# Patient Record
Sex: Male | Born: 1960 | Race: Black or African American | Hispanic: No | Marital: Single | Smoking: Former smoker
Health system: Southern US, Community
[De-identification: ages and names within clinical notes are randomized; demographics above are authoritative.]

## PROBLEM LIST (undated history)

## (undated) DIAGNOSIS — R1310 Dysphagia, unspecified: Secondary | ICD-10-CM

## (undated) DIAGNOSIS — I8500 Esophageal varices without bleeding: Secondary | ICD-10-CM

## (undated) DIAGNOSIS — G936 Cerebral edema: Secondary | ICD-10-CM

## (undated) DIAGNOSIS — I10 Essential (primary) hypertension: Secondary | ICD-10-CM

## (undated) DIAGNOSIS — E46 Unspecified protein-calorie malnutrition: Secondary | ICD-10-CM

## (undated) DIAGNOSIS — E876 Hypokalemia: Secondary | ICD-10-CM

## (undated) DIAGNOSIS — R739 Hyperglycemia, unspecified: Secondary | ICD-10-CM

---

## 1998-06-03 ENCOUNTER — Emergency Department (HOSPITAL_COMMUNITY): Admission: EM | Admit: 1998-06-03 | Discharge: 1998-06-03 | Payer: Self-pay | Admitting: Emergency Medicine

## 1999-06-04 ENCOUNTER — Emergency Department (HOSPITAL_COMMUNITY): Admission: EM | Admit: 1999-06-04 | Discharge: 1999-06-04 | Payer: Self-pay | Admitting: Emergency Medicine

## 1999-06-04 ENCOUNTER — Encounter: Payer: Self-pay | Admitting: Emergency Medicine

## 1999-10-19 ENCOUNTER — Emergency Department (HOSPITAL_COMMUNITY): Admission: EM | Admit: 1999-10-19 | Discharge: 1999-10-19 | Payer: Self-pay | Admitting: Emergency Medicine

## 2000-03-29 ENCOUNTER — Emergency Department (HOSPITAL_COMMUNITY): Admission: EM | Admit: 2000-03-29 | Discharge: 2000-03-29 | Payer: Self-pay

## 2000-11-19 ENCOUNTER — Emergency Department (HOSPITAL_COMMUNITY): Admission: EM | Admit: 2000-11-19 | Discharge: 2000-11-19 | Payer: Self-pay | Admitting: Emergency Medicine

## 2003-12-16 ENCOUNTER — Encounter: Admission: RE | Admit: 2003-12-16 | Discharge: 2003-12-16 | Payer: Self-pay | Admitting: Internal Medicine

## 2004-06-29 ENCOUNTER — Encounter: Admission: RE | Admit: 2004-06-29 | Discharge: 2004-06-29 | Payer: Self-pay | Admitting: *Deleted

## 2005-05-20 ENCOUNTER — Emergency Department (HOSPITAL_COMMUNITY): Admission: EM | Admit: 2005-05-20 | Discharge: 2005-05-20 | Payer: Self-pay | Admitting: Emergency Medicine

## 2006-01-03 ENCOUNTER — Encounter: Admission: RE | Admit: 2006-01-03 | Discharge: 2006-01-03 | Payer: Self-pay | Admitting: Internal Medicine

## 2008-02-05 ENCOUNTER — Ambulatory Visit (HOSPITAL_COMMUNITY): Admission: RE | Admit: 2008-02-05 | Discharge: 2008-02-05 | Payer: Self-pay | Admitting: *Deleted

## 2008-02-05 ENCOUNTER — Encounter (INDEPENDENT_AMBULATORY_CARE_PROVIDER_SITE_OTHER): Payer: Self-pay | Admitting: *Deleted

## 2008-03-20 ENCOUNTER — Encounter: Admission: RE | Admit: 2008-03-20 | Discharge: 2008-03-20 | Payer: Self-pay | Admitting: Internal Medicine

## 2008-12-05 ENCOUNTER — Ambulatory Visit: Payer: Self-pay | Admitting: Emergency Medicine

## 2008-12-05 ENCOUNTER — Inpatient Hospital Stay (HOSPITAL_COMMUNITY): Admission: EM | Admit: 2008-12-05 | Discharge: 2008-12-08 | Payer: Self-pay | Admitting: Emergency Medicine

## 2008-12-07 ENCOUNTER — Ambulatory Visit: Payer: Self-pay | Admitting: Gastroenterology

## 2009-01-06 ENCOUNTER — Ambulatory Visit (HOSPITAL_COMMUNITY): Admission: RE | Admit: 2009-01-06 | Discharge: 2009-01-06 | Payer: Self-pay | Admitting: *Deleted

## 2010-04-08 ENCOUNTER — Encounter: Admission: RE | Admit: 2010-04-08 | Discharge: 2010-04-08 | Payer: Self-pay | Admitting: Internal Medicine

## 2010-08-03 ENCOUNTER — Encounter: Payer: Self-pay | Admitting: *Deleted

## 2010-10-20 LAB — COMPREHENSIVE METABOLIC PANEL
ALT: 38 U/L (ref 0–53)
ALT: 40 U/L (ref 0–53)
ALT: 51 U/L (ref 0–53)
ALT: 65 U/L — ABNORMAL HIGH (ref 0–53)
AST: 130 U/L — ABNORMAL HIGH (ref 0–37)
AST: 147 U/L — ABNORMAL HIGH (ref 0–37)
Albumin: 2.3 g/dL — ABNORMAL LOW (ref 3.5–5.2)
Alkaline Phosphatase: 118 U/L — ABNORMAL HIGH (ref 39–117)
CO2: 22 mEq/L (ref 19–32)
Calcium: 6.6 mg/dL — ABNORMAL LOW (ref 8.4–10.5)
Calcium: 6.8 mg/dL — ABNORMAL LOW (ref 8.4–10.5)
Calcium: 7.4 mg/dL — ABNORMAL LOW (ref 8.4–10.5)
Chloride: 110 mEq/L (ref 96–112)
Creatinine, Ser: 0.92 mg/dL (ref 0.4–1.5)
Creatinine, Ser: 0.93 mg/dL (ref 0.4–1.5)
Creatinine, Ser: 1.17 mg/dL (ref 0.4–1.5)
GFR calc Af Amer: >60 mL/min (ref >60)
GFR calc Af Amer: >60 mL/min (ref >60)
GFR calc non Af Amer: >60 mL/min (ref >60)
Glucose, Bld: 128 mg/dL — ABNORMAL HIGH (ref 70–99)
Glucose, Bld: 155 mg/dL — ABNORMAL HIGH (ref 70–99)
Potassium: 5.2 mEq/L — ABNORMAL HIGH (ref 3.5–5.1)
Sodium: 132 mEq/L — ABNORMAL LOW (ref 135–145)
Sodium: 132 mEq/L — ABNORMAL LOW (ref 135–145)
Sodium: 133 mEq/L — ABNORMAL LOW (ref 135–145)
Sodium: 134 mEq/L — ABNORMAL LOW (ref 135–145)
Total Bilirubin: 2.6 mg/dL — ABNORMAL HIGH (ref 0.3–1.2)
Total Protein: 4.8 g/dL — ABNORMAL LOW (ref 6.0–8.3)
Total Protein: 5.1 g/dL — ABNORMAL LOW (ref 6.0–8.3)
Total Protein: 6.4 g/dL (ref 6.0–8.3)

## 2010-10-20 LAB — PROTIME-INR
INR: 1.3 (ref 0.00–1.49)
INR: 1.4 (ref 0.00–1.49)
Prothrombin Time: 17 seconds — ABNORMAL HIGH (ref 11.6–15.2)
Prothrombin Time: 18.2 seconds — ABNORMAL HIGH (ref 11.6–15.2)

## 2010-10-20 LAB — CROSSMATCH: Antibody Screen: NEGATIVE

## 2010-10-20 LAB — HEMOGLOBIN AND HEMATOCRIT, BLOOD
HCT: 30 % — ABNORMAL LOW (ref 39.0–52.0)
HCT: 30.2 % — ABNORMAL LOW (ref 39.0–52.0)
Hemoglobin: 10.4 g/dL — ABNORMAL LOW (ref 13.0–17.0)
Hemoglobin: 10.4 g/dL — ABNORMAL LOW (ref 13.0–17.0)
Hemoglobin: 10.6 g/dL — ABNORMAL LOW (ref 13.0–17.0)

## 2010-10-20 LAB — DIFFERENTIAL
Basophils Relative: 0 % (ref 0–1)
Eosinophils Absolute: 0 10*3/uL (ref 0.0–0.7)
Lymphs Abs: 2.4 10*3/uL (ref 0.7–4.0)
Monocytes Absolute: 0.7 10*3/uL (ref 0.1–1.0)
Monocytes Relative: 7 % (ref 3–12)
Neutro Abs: 6.8 10*3/uL (ref 1.7–7.7)

## 2010-10-20 LAB — PREPARE RBC (CROSSMATCH)

## 2010-10-20 LAB — CBC
Hemoglobin: 10.4 g/dL — ABNORMAL LOW (ref 13.0–17.0)
Hemoglobin: 7.8 g/dL — CL (ref 13.0–17.0)
MCHC: 35.4 g/dL (ref 30.0–36.0)
MCHC: 35.4 g/dL (ref 30.0–36.0)
MCHC: 35.5 g/dL (ref 30.0–36.0)
MCV: 92 fL (ref 78.0–100.0)
MCV: 93 fL (ref 78.0–100.0)
MCV: 93.8 fL (ref 78.0–100.0)
Platelets: 69 10*3/uL — ABNORMAL LOW (ref 150–400)
Platelets: 69 10*3/uL — ABNORMAL LOW (ref 150–400)
RBC: 3.12 MIL/uL — ABNORMAL LOW (ref 4.22–5.81)
RBC: 3.51 MIL/uL — ABNORMAL LOW (ref 4.22–5.81)
RDW: 16.1 % — ABNORMAL HIGH (ref 11.5–15.5)
RDW: 19 % — ABNORMAL HIGH (ref 11.5–15.5)
RDW: 19.3 % — ABNORMAL HIGH (ref 11.5–15.5)
WBC: 5.9 10*3/uL (ref 4.0–10.5)
WBC: 9 10*3/uL (ref 4.0–10.5)

## 2010-10-20 LAB — APTT: aPTT: 29 seconds (ref 24–37)

## 2010-10-20 LAB — HEMOCCULT GUIAC POC 1CARD (OFFICE): Fecal Occult Bld: POSITIVE

## 2010-10-20 LAB — ABO/RH: ABO/RH(D): O POS

## 2010-11-24 NOTE — Op Note (Signed)
NAMEKIMARION, CHERY NO.:  [id number]   MEDICAL RECORD NO.:  [id number]          PATIENT TYPE:  AMB   LOCATION:  ENDO                         FACILITY:  [facility]   PHYSICIAN:  Georgiana Spinner, M.D.    DATE OF BIRTH:  [birth year]   DATE OF PROCEDURE:  02/05/2008  DATE OF DISCHARGE:                               OPERATIVE REPORT   PROCEDURE:  Upper endoscopy with biopsy.   INDICATIONS:  GERD, with cirrhosis, rule out varices.   ANESTHESIA:  1. Fentanyl 100 mcg.  2. Versed 10 mg.  3. Benadryl 25 mg.   PROCEDURE:  With the patient mildly sedated in the left lateral  decubitus position, the Pentax videoscopic endoscope was inserted in the  mouth, passed under direct vision through the esophagus, which appeared  normal until we reached the distal esophagus, and there was one band of  esophageal varices, with dilation extending proximally.  There was a  second band that just terminated very abruptly in the distal esophagus.  We entered into the stomach.  Fundus, body, antrum, duodenal bulb, and  second portion of the duodenum were visualized.  From this point, the  endoscope was slowly withdrawn, taking circumferential views of the  duodenal mucosa until the endoscope had been pulled back into stomach  and placed in retroflexion to view the stomach from below.  The  endoscope was straightened and withdrawn, taking circumferential views  of the remaining gastric and esophageal mucosa, stopping to photograph  and biopsy the fundus, where there appeared to be changes of passive  gastropathy, and the distal esophagus, where there appeared to be  possibly one area of Barrett's.  The endoscope was withdrawn, taking  circumferential views of the remaining esophageal mucosa.  The patient's  vital signs and pulse oximeter remained stable.  The patient tolerated  the procedure well, without apparent complications.   FINDINGS:  Esophageal varices.  No evidence of gastric varices.   Passive  gastropathy seen in the fundus, and question of one lick of Barrett's  esophagus.   PLAN:  Await biopsy reports.  The patient will call me for results and  follow up with me as an outpatient.  Will discuss alcohol abstinence and  need for this in this patient.  Proceed to colonoscopy as planned.           ______________________________  Georgiana Spinner, M.D.     GMO/MEDQ  D:  02/05/2008  T:  02/05/2008  Job:  [id number]

## 2010-11-24 NOTE — H&P (Signed)
NAMETHORNE, WIRZ NO.:  [id number]   MEDICAL RECORD NO.:  [id number]          PATIENT TYPE:  INP   LOCATION:  2106                         FACILITY:  [facility]   PHYSICIAN:  Zannie Cove, MD     DATE OF BIRTH:  January 18, 1961   DATE OF ADMISSION:  12/05/2008  DATE OF DISCHARGE:                              HISTORY & PHYSICAL   PRIMARY CARE PHYSICIAN:  Dr. Juline Patch.   GI PHYSICIAN:  Georgiana Spinner, M.D.   CHIEF COMPLAINT:  Vomiting blood.   HISTORY OF PRESENTING ILLNESS:  Mr. Morado is a 50 year old gentleman  with prior history of esophageal varices presents after 4 episodes of  hematemesis which were large volume bright red blood and 3 episodes of  melena since this afternoon.  Denies any abdominal pain.  He denies any  NSAID use.  He used to drink hard liquor excessively in the past, he has  cut down to currently about 4 beers per day.  His last drink was  yesterday.  Denies any dizziness, syncope, chest pain, or shortness of  breath.   PAST MEDICAL HISTORY:  Esophageal varices, diagnosed about 6 years ago,  and alcoholic liver disease.   PAST SURGICAL HISTORY:  None.   MEDICATIONS:  1. Zetia 10 mg once a day.  2. Xanax 0.5 mg twice a day.  3. Toprol-XL 25 mg once a day.  4. Spironolactone 25 mg once a day.  5. Alavert 1 tab p.o. p.r.n.   ALLERGIES:  No known drug allergies.   SOCIAL HISTORY:  Lives at home.  Works as a Merchandiser, retail.  He used to  drink hard liquor, heavy drinker for about 30 years, cut down to 4 beers  per day.  Smokes 1 pack per day for 30 years.   FAMILY HISTORY:  No liver disease.  No GI cancers.   REVIEW OF SYSTEMS:  Twelve systems reviewed negative except per HPI.   PHYSICAL EXAMINATION:  VITAL SIGNS:  Temp is 102, pulse is 110, blood  pressure 191/60, respirations 18, sating 98% on room air.  GENERAL:  Alert, awake, oriented x3.  No distress.  HEENT:  With mild icterus and pallor.  No JVD.  Moist mucous membranes.  CARDIOVASCULAR:  S1, S2, regular rate rhythm.  No murmurs, rubs, or  gallops.  LUNGS:  Clear to auscultation bilaterally.  ABDOMEN:  Mildly distended, nontender, positive bowel sounds.  EXTREMITIES:  No edema, clubbing, cyanosis.  RECTAL:  Heme positive black stools.   LABORATORY DATA:  Hemoglobin of 7.7, white count of 9.9, platelets 69.  Sodium 132, potassium 5.2, chloride 102, bicarb 22, BUN 14, creatinine  1.45, glucose 133, PT is 17.0, INR 1.3, PTT is 29, total bilirubin is  2.0, AST 92, ALT 38, alkaline phosphatase 118.   ASSESSMENT AND PLAN:  1. A 50 year old gentleman with upper gastrointestinal bleed, given      prior history of varices suspect, variceal bleed admitted to the      ICU.  He is too large for intravenous and normal saline at 125 mL      an hour.  We will type and cross packed red blood cells, and check      H and H q.4 h. for now transfuse to keep hemoglobin greater than 9.      Octreotide 50 mcg bolus followed by 25 mcg per hour infusion.      Gastrointestinal has been consulted and will see the patient      tonight.  The patient also started Protonix 40 mg intravenous      daily.  2. Alcohol liver disease with varices.  We will give the patient      thiamine and folate for now.  Start the patient on CIWA scale to      watch for signs and symptoms of withdrawal.  Hold Toprol and      Aldactone for now.  3. Thrombocytopenia.  Most likely splenic sequestration secondary to      his alcoholic liver disease.  4. Deep venous thrombosis prophylaxis with PAS stockings.  5. The patient is full code.      Zannie Cove, MD  Electronically Signed     PJ/MEDQ  D:  12/05/2008  T:  12/06/2008  Job:  [id number]

## 2010-11-24 NOTE — Discharge Summary (Signed)
NAMEGWIN, EAGON NO.:  [id number]   MEDICAL RECORD NO.:  [id number]          PATIENT TYPE:  INP   LOCATION:  5159                         FACILITY:  [facility]   PHYSICIAN:  Michelene Gardener, MD    DATE OF BIRTH:  08/10/1960   DATE OF ADMISSION:  12/05/2008  DATE OF DISCHARGE:  12/08/2008                               DISCHARGE SUMMARY   DISCHARGE DIAGNOSES:  1. Acute post hemorrhagic anemia that required blood transfusion.  2. Upper gastrointestinal bleed secondary to esophageal varices.  3. Hypotension secondary to hemorrhage.  4. Hypertension, Toprol and spironolactone have been held during this      hospitalization.  5. Alcoholic cirrhosis and the patient is an active drinker.  6. Acute renal failure secondary to prerenal azotemia that has      resolved.  7. Hyponatremia that improved with his IV normal saline.  8. Thrombocytopenia secondary to cirrhosis.   DISCHARGE MEDICATIONS:  1. Zetia 10 mg p.o. once a day.  2. Alavert 1 tablet p.o. as needed.  3. Xanax 0.5 mg twice daily.  4. Protonix 40 mg once a day.   MEDICATIONS STOPPED:  Include Toprol and spironolactone until  reevaluated by primary physician.   CONSULTATIONS:  GI consult.   PROCEDURE:  EGD that showed large varices in the esophagus that were  banded.   Follow up with primary doctor within 1-2 weeks and with Dr. Virginia Rochester within 1-  2 weeks.   DIAGNOSTIC STUDIES:  Chest x-ray on Dec 06, 2008, showed mild  congestion.   COURSE OF HOSPITALIZATION:  1. Acute posthemorrhagic anemia.  This patient was admitted to the      hospital.  The patient was transfused.  Hemoglobin at the time of      admission was 7.7.  Post transfusion hemoglobin remains stable.  GI      consultation was done.  The patient was taken to EGD that showed      esophageal varices that were banded.  The patient remained stable      during this hospitalization with no recurrent bleed.  He received      octreotide drip in  addition to IV Protonix.  He was monitored      initially in the intensive care unit and then when stable, he was      monitored in the floor.  2. Upper GI bleed secondary to esophageal varices, management was as      #1.  3. Hypotension that was secondary to volume loss from his bleeding and      his Toprol and his spironolactone were held during this      hospitalization.  Blood pressure at the time of discharge was      116/67.  I will recommend to continue hold his Toprol and      spironolactone until reevaluated by his primary doctor.  4. Alcoholic cirrhosis.  The patient is an active drinker.  I had a      prolonged discussion with him regarding his drinking and he stated      that he is quitting and  he will never go back to drinking.  5. Acute renal failure and that was secondary to dehydration and that      was corrected by IV fluids.  BUN/creatinine at the time of      discharge is 8/0.92.  6. Hyponatremia.  Again that was improved with IV fluids and sodium at      the time of discharge was 134.  7. Thrombocytopenia secondary to his underlying cirrhosis.  Recommend      to monitor this on a regular basis by his primary doctor.   The patient was doing very well at the time of discharge.  He is back to  his baseline.  There is no abdominal pain.  There is no nausea, there is  no vomiting.  There is no more bleeding.  He tolerated diet well and  will be discharged home today and will follow with his primary doctor  and Dr. Virginia Rochester within a week.   Total assessment time is 1 hour.      Michelene Gardener, MD  Electronically Signed     NAE/MEDQ  D:  12/08/2008  T:  12/09/2008  Job:  [id number]

## 2010-11-24 NOTE — Op Note (Signed)
Joshua Price, Joshua Price NO.:  [id number]   MEDICAL RECORD NO.:  [id number]          PATIENT TYPE:  AMB   LOCATION:  ENDO                         [facility]ACILITY:  [facility]   PHYSICIAN:  Georgiana Spinner, M.D.    DATE OF BIRTH:  07/05/1961   DATE OF PROCEDURE:  02/05/2008  DATE OF DISCHARGE:                               OPERATIVE REPORT   PROCEDURE:  Colonoscopy.   INDICATIONS:  Colon cancer screening.   ANESTHESIA:  1. Fentanyl 100 mcg.  2. Versed 5 mg.   PROCEDURE:  With the patient mildly sedated in the left lateral  decubitus position, the Pentax videoscopic colonoscope was inserted into  the rectum and passed under direct vision.  With pressure applied, the  patient rolled to his back, to his right side, and subsequently back to  his back and left side, we were able to reach the cecum, identified by  ileocecal valve and appendiceal orifice, both of which were  photographed.  From this point, the colonoscope was slowly withdrawn,  taking circumferential views of the colonic mucosa, stopping in the  rectum which appeared normal on direct and showed hemorrhoids on  retroflexed view, and pulled through the anal canal and showed  hemorrhoids as well.  I inspected the perineum, and there were some  external skin tags.  The patient tolerated the procedure well, without  apparent complications.   FINDINGS:  Internal and external hemorrhoids, otherwise unremarkable  examination, limited somewhat by prep which was suboptimal, but no gross  lesions seen.           ______________________________  Georgiana Spinner, M.D.     GMO/MEDQ  D:  02/05/2008  T:  02/05/2008  Job:  [id number]

## 2010-11-24 NOTE — Op Note (Signed)
Joshua Price, GANUS NO.:  [id number]   MEDICAL RECORD NO.:  [id number]          PATIENT TYPE:  INP   LOCATION:  2106                         FACILITY:  [facility]   PHYSICIAN:  Georgiana Spinner, M.D.    DATE OF BIRTH:  11/13/1960   DATE OF PROCEDURE:  DATE OF DISCHARGE:                               OPERATIVE REPORT   PROCEDURE:  Upper endoscopy with banding of varices and injection of  stomach.   INDICATIONS:  Acute gastrointestinal bleeding manifested by passage of  blood per rectum and hematemesis with hypotension partially corrected  with fluids and blood.   PROCEDURE:  With the patient in the left lateral decubitus position  under general anesthesia, the Pentax videoscopic endoscope was inserted  in the mouth and passed under direct vision through the esophagus which  showed two columns of fairly large varices distally.  On both columns,  there appeared to be a mild red wheel but no active bleeding was seen  from the varices on initial inspection.  We advanced the endoscope into  the stomach and there was in the fundus a one small area of bright red  blood that we washed but no active bleeding was seen at this site.  No  ulcers were noted and it was photographed.  We advanced to the remainder  of the stomach and duodenal bulb, second portion of duodenum were  remarkably clear of blood considering the history of hematemesis that  was in fact witnessed in the emergency room.  From this point the  endoscope was slowly withdrawn taking circumferential views of the  duodenal mucosa until the endoscope been pulled back into the stomach  placed in retroflexion to view the stomach from below.  The endoscope  was then straightened and pulled back to the fundus where the bright red  blood was seen and I elected doing after clearing this area to inject 2  mL of epinephrine directly into this site.  Blanching was seen but no  bleeding was noted actively.  The endoscope was  then pulled back to the  esophagus and withdrawn.  Subsequently, a bander was introduced to the  endoscope which I had trouble advancing when reinserting so I took the  bander off and instead reinserted the endoscope, passed to the stomach,  passed a guide wire, pulled out the endoscope and then re-attached the  bander over the endoscope and then inserted the endoscope and follow the  guidewire back into the esophagus.  In the distal esophagus, both  columns that had previously been noted were visualized and bands were  placed to one larger column one on the other.  I then withdrew the  endoscope, removed the bander and reinserted the endoscope to see that  the bands were still intact and went distally into the stomach, no  active bleeding was again seen.  The endoscope was withdrawn.  The  patient's vital signs and pulse oximeter remained stable.  The patient  tolerated the procedure well without apparent complications.   FINDINGS:  Large varices in the esophagus banded two columns with three  bands and injection to the area where there was bright red blood with no  active lesions 2 mL.   IMPRESSION:  Cause hematemesis certainly not overtly clear from this  procedure but I elected to band varices and inject an area where blood  was seen.  At this point, we will follow vital signs and hemoglobin  response to transfusion and decide on further therapy depending on the  patient's thrombin results.           ______________________________  Georgiana Spinner, M.D.    GMO/MEDQ  D:  12/06/2008  T:  12/06/2008  Job:  [id number]

## 2010-11-24 NOTE — Op Note (Signed)
Joshua Price, Joshua Price NO.:  [id number]   MEDICAL RECORD NO.:  [id number]          PATIENT TYPE:  AMB   LOCATION:  ENDO                         FACILITY:  [facility]   PHYSICIAN:  Georgiana Spinner, M.D.    DATE OF BIRTH:  Feb 07, 1961   DATE OF PROCEDURE:  DATE OF DISCHARGE:                               OPERATIVE REPORT   PROCEDURE:  Upper endoscopy with banding of varices.   ANESTHESIA:  1. Fentanyl 100 mcg.  2. Versed 8 mg.  3. Benadryl 25 mg.   PROCEDURE:  With the patient mildly sedated in the left lateral  decubitus position, the Pentax videoscopic endoscope was inserted in the  mouth and passed under direct vision through the esophagus which  appeared normal until we reached the distal esophagus and we found two  columns of varices somewhat larger than the other and a photograph of  this area was taken.  We advanced into the stomach and the fundus, body,  antrum, duodenal bulb and the second portion of the duodenum were  visualized.  From this point, the endoscope was slowly withdrawn taking  circumferential views of the duodenal mucosa until the endoscope had  been pulled back into the stomach placed in retroflexion to view the  stomach from below.  The endoscope was straightened and withdrawn taking  circumferential views of the remaining gastric and esophageal mucosa  stopping to take a photograph.  Blood flecks were seen in the stomach.  After the endoscope had been withdrawn, a band ligator was attached to  the scope and the scope was reinserted into the esophagus and advanced  to the distal esophagus where the previously noted variceal columns were  seen and were banded.  I placed two bands on,  the M1 on the larger and  one on the smaller column quite distally and once this was completed to  my satisfaction.  The endoscope was withdrawn.  The patient's vital  signs and pulse oximeter remained stable.  The patient tolerated the  procedure well without  apparent complications.   FINDINGS:  Esophageal varices banded with clinical response.  The  patient will follow-up with me as an outpatient.           ______________________________  Georgiana Spinner, M.D.     GMO/MEDQ  D:  01/06/2009  T:  01/06/2009  Job:  [id number]

## 2013-05-12 ENCOUNTER — Encounter (HOSPITAL_COMMUNITY): Payer: Self-pay | Admitting: Emergency Medicine

## 2013-05-12 ENCOUNTER — Emergency Department (HOSPITAL_COMMUNITY)
Admission: EM | Admit: 2013-05-12 | Discharge: 2013-05-12 | Disposition: A | Discharge status: Home / Self Care | Payer: Self-pay | Attending: Emergency Medicine | Admitting: Emergency Medicine

## 2013-05-12 DIAGNOSIS — T7840XA Allergy, unspecified, initial encounter: Secondary | ICD-10-CM

## 2013-05-12 DIAGNOSIS — L509 Urticaria, unspecified: Secondary | ICD-10-CM | POA: Insufficient documentation

## 2013-05-12 DIAGNOSIS — F17200 Nicotine dependence, unspecified, uncomplicated: Secondary | ICD-10-CM | POA: Insufficient documentation

## 2013-05-12 DIAGNOSIS — Z8679 Personal history of other diseases of the circulatory system: Secondary | ICD-10-CM | POA: Insufficient documentation

## 2013-05-12 HISTORY — DX: Esophageal varices without bleeding: I85.00

## 2013-05-12 MED ORDER — DIPHENHYDRAMINE HCL 50 MG/ML IJ SOLN
25.0000 mg | Freq: Once | INTRAMUSCULAR | Status: AC
  Administered 2013-05-12: 25 mg via INTRAVENOUS
  Filled 2013-05-12: qty 1

## 2013-05-12 MED ORDER — EPINEPHRINE HCL 0.1 MG/ML IJ SOSY
0.1000 mg | PREFILLED_SYRINGE | Freq: Once | INTRAMUSCULAR | Status: AC
  Administered 2013-05-12: 0.1 mg via SUBCUTANEOUS
  Filled 2013-05-12: qty 10

## 2013-05-12 MED ORDER — DIPHENHYDRAMINE HCL 25 MG PO CAPS: 25.0000 mg | ORAL_CAPSULE | Freq: Four times a day (QID) | ORAL | Status: DC | PRN

## 2013-05-12 MED ORDER — PREDNISONE 10 MG PO TABS: 20.0000 mg | ORAL_TABLET | Freq: Every day | ORAL | Status: DC

## 2013-05-12 MED ORDER — METHYLPREDNISOLONE SODIUM SUCC 125 MG IJ SOLR
125.0000 mg | Freq: Once | INTRAMUSCULAR | Status: AC
  Administered 2013-05-12: 125 mg via INTRAVENOUS
  Filled 2013-05-12: qty 2

## 2013-05-12 MED ORDER — FAMOTIDINE IN NACL 20-0.9 MG/50ML-% IV SOLN
20.0000 mg | Freq: Once | INTRAVENOUS | Status: AC
  Administered 2013-05-12: 20 mg via INTRAVENOUS
  Filled 2013-05-12: qty 50

## 2013-05-12 NOTE — ED Notes (Signed)
Pt reports itching and hives all over approx 45 mins ago, unsure what he is allergic to. Having swelling to face and lip, airway intact at this time. Pt has not taken any benadryl pta.

## 2013-05-12 NOTE — ED Provider Notes (Signed)
CSN: [id number]     Arrival date & time 05/12/13  1220 History   First MD Initiated Contact with Patient 05/12/13 1237     Chief Complaint  Patient presents with  . Allergic Reaction    HPI  Patient presents with rash and swollen lips. He exhibits this morning at his mother's house. Some easy many times before. Chronic over the first time he been in his closet for over a year. Her itching of his arms or joint swellingand itching and noticed a rash on his arms and legs he presents here. No tongue swelling. No posterior pharyngeal swelling. No dyspnea. No wheezing.  Past Medical History  Diagnosis Date  . Esophageal varices    History reviewed. No pertinent past surgical history. History reviewed. No pertinent family history. History  Substance Use Topics  . Smoking status: Current Every Day Smoker    Types: Cigarettes  . Smokeless tobacco: Not on file  . Alcohol Use: Yes    Review of Systems  Constitutional: Negative for fever, chills, diaphoresis, appetite change and fatigue.  HENT: Negative for mouth sores, sore throat and trouble swallowing.        Lip swelling  Eyes: Negative for visual disturbance.  Respiratory: Negative for cough, chest tightness, shortness of breath and wheezing.   Cardiovascular: Negative for chest pain.  Gastrointestinal: Negative for nausea, vomiting, abdominal pain, diarrhea and abdominal distention.  Endocrine: Negative for polydipsia, polyphagia and polyuria.  Genitourinary: Negative for dysuria, frequency and hematuria.  Musculoskeletal: Negative for gait problem.  Skin: Positive for rash. Negative for color change and pallor.  Neurological: Negative for dizziness, syncope, light-headedness and headaches.  Hematological: Does not bruise/bleed easily.  Psychiatric/Behavioral: Negative for behavioral problems and confusion.    Allergies  Review of patient's allergies indicates no known allergies.  Home Medications   Current Outpatient Rx   Name  Route  Sig  Dispense  Refill  . diphenhydrAMINE (BENADRYL) 25 mg capsule   Oral   Take 1 capsule (25 mg total) by mouth every 6 (six) hours as needed for itching.   30 capsule   0   . predniSONE (DELTASONE) 10 MG tablet   Oral   Take 2 tablets (20 mg total) by mouth daily.   10 tablet   0    BP 152/104  Pulse 69  Temp(Src) 98.2 F (36.8 C) (Oral)  Resp 18  SpO2 100% Physical Exam  Constitutional: He is oriented to person, place, and time. He appears well-developed and well-nourished. No distress.  HENT:  Head: Normocephalic.  Swelling of the upper lip. Tongue normal posterior. Normal uvula midline.  Eyes: Conjunctivae are normal. Pupils are equal, round, and reactive to light. No scleral icterus.  Neck: Normal range of motion. Neck supple. No thyromegaly present.  No stridor  Cardiovascular: Normal rate and regular rhythm.  Exam reveals no gallop and no friction rub.   No murmur heard. Pulmonary/Chest: Effort normal and breath sounds normal. No respiratory distress. He has no wheezes. He has no rales.  Her lungs no wheezing  Abdominal: Soft. Bowel sounds are normal. He exhibits no distension. There is no tenderness. There is no rebound.  Musculoskeletal: Normal range of motion.  Neurological: He is alert and oriented to person, place, and time.  Skin: Skin is warm and dry. No rash noted.  Urticaria diffuse.  Psychiatric: He has a normal mood and affect. His behavior is normal.    ED Course  Procedures (including critical care time) Labs Review Labs  Reviewed - No data to display Imaging Review No results found.  EKG Interpretation   None       MDM   1. Allergic reaction, initial encounter   2. Urticaria    Patient given 0.1 cc subcutaneous 1 1000 appendectomy. Given IV Benadryl 25 mg, Pepcid 20 mg, Cipro 125, observed for the next hour and half.  Patient asymptomatic. Exam is normal ice to the lip tongue or pharyngeal edema on reexam. Clear lungs. No  hypoxemia. Resolved rash. His intention is still uncertain. Clean or discard the code. Recheck with any recurrence.    Roney Marion, MD 05/12/13 [phone]

## 2013-06-20 ENCOUNTER — Telehealth: Payer: Self-pay | Admitting: General Practice

## 2013-06-20 NOTE — Telephone Encounter (Signed)
Returned pt's call to schedule appt, LVM for pt. KR

## 2013-07-18 ENCOUNTER — Ambulatory Visit: Payer: Self-pay | Admitting: Internal Medicine

## 2013-08-22 ENCOUNTER — Encounter: Payer: Self-pay | Admitting: Internal Medicine

## 2013-08-22 ENCOUNTER — Ambulatory Visit: Payer: Self-pay | Attending: Internal Medicine | Admitting: Internal Medicine

## 2013-08-22 VITALS — BP 170/100 | HR 74 | Temp 99.0°F | Resp 16 | Ht 66.0 in | Wt 170.0 lb

## 2013-08-22 DIAGNOSIS — Z139 Encounter for screening, unspecified: Secondary | ICD-10-CM

## 2013-08-22 DIAGNOSIS — K0889 Other specified disorders of teeth and supporting structures: Secondary | ICD-10-CM

## 2013-08-22 DIAGNOSIS — IMO0001 Reserved for inherently not codable concepts without codable children: Secondary | ICD-10-CM | POA: Insufficient documentation

## 2013-08-22 DIAGNOSIS — F17200 Nicotine dependence, unspecified, uncomplicated: Secondary | ICD-10-CM | POA: Insufficient documentation

## 2013-08-22 DIAGNOSIS — K029 Dental caries, unspecified: Secondary | ICD-10-CM | POA: Insufficient documentation

## 2013-08-22 DIAGNOSIS — K089 Disorder of teeth and supporting structures, unspecified: Secondary | ICD-10-CM | POA: Insufficient documentation

## 2013-08-22 DIAGNOSIS — I10 Essential (primary) hypertension: Secondary | ICD-10-CM | POA: Insufficient documentation

## 2013-08-22 MED ORDER — LISINOPRIL-HYDROCHLOROTHIAZIDE 10-12.5 MG PO TABS: 1.0000 | ORAL_TABLET | Freq: Every day | ORAL | Status: DC

## 2013-08-22 MED ORDER — CLONIDINE HCL 0.1 MG PO TABS
0.2000 mg | ORAL_TABLET | Freq: Once | ORAL | Status: AC
Start: 2013-08-22 — End: 2013-08-22
  Administered 2013-08-22: 0.2 mg via ORAL

## 2013-08-22 MED ORDER — AMOXICILLIN-POT CLAVULANATE 875-125 MG PO TABS: 1.0000 | ORAL_TABLET | Freq: Two times a day (BID) | ORAL | Status: DC

## 2013-08-22 NOTE — Progress Notes (Signed)
Patient Demographics  Joshua Price, is a 53 y.o. male  GNF:[id number]  QIO:[id number]  DOB - Jun 14, 1961  CC:  Chief Complaint  Patient presents with  . Establish Care       HPI: Joshua Price is a 53 y.o. male here today to establish medical care. Patient has history of hypertension and is per patient he used to take some medication in the past, today's blood pressure is elevated, was given clonidine 0.2 mg in the office and his repeat blood pressure is 170/100 patient denies any headache dizziness chest and shortness of breath, he also reported to have lot of dental pain and has cavities patient wants to see a dentist. Patient has No headache, No chest pain, No abdominal pain - No Nausea, No new weakness tingling or numbness, No Cough - SOB.  No Known Allergies Past Medical History  Diagnosis Date  . Esophageal varices    Current Outpatient Prescriptions on File Prior to Visit  Medication Sig Dispense Refill  . diphenhydrAMINE (BENADRYL) 25 mg capsule Take 1 capsule (25 mg total) by mouth every 6 (six) hours as needed for itching.  30 capsule  0  . predniSONE (DELTASONE) 10 MG tablet Take 2 tablets (20 mg total) by mouth daily.  10 tablet  0   No current facility-administered medications on file prior to visit.   Family History  Problem Relation Age of Onset  . Cancer Sister   . Hypertension Sister   . Hypertension Mother    History   Social History  . Marital Status: Single    Spouse Name: N/A    Number of Children: N/A  . Years of Education: N/A   Occupational History  . Not on file.   Social History Main Topics  . Smoking status: Current Every Day Smoker -- 1.00 packs/day    Types: Cigarettes  . Smokeless tobacco: Not on file  . Alcohol Use: 14.4 oz/week    24 Cans of beer per week     Comment:  quit 2 days ago   . Drug Use: No  . Sexual Activity: Not on file   Other Topics Concern  . Not on file   Social History Narrative  . No narrative on  file    Review of Systems: Constitutional: Negative for fever, chills, diaphoresis, activity change, appetite change and fatigue. HENT: Negative for ear pain, nosebleeds, congestion, facial swelling, rhinorrhea, neck pain, neck stiffness and ear discharge. Dental cavities dental pain Eyes: Negative for pain, discharge, redness, itching and visual disturbance. Respiratory: Negative for cough, choking, chest tightness, shortness of breath, wheezing and stridor.  Cardiovascular: Negative for chest pain, palpitations and leg swelling. Gastrointestinal: Negative for abdominal distention. Genitourinary: Negative for dysuria, urgency, frequency, hematuria, flank pain, decreased urine volume, difficulty urinating and dyspareunia.  Musculoskeletal: Negative for back pain, joint swelling, arthralgia and gait problem. Neurological: Negative for dizziness, tremors, seizures, syncope, facial asymmetry, speech difficulty, weakness, light-headedness, numbness and headaches.  Hematological: Negative for adenopathy. Does not bruise/bleed easily. Psychiatric/Behavioral: Negative for hallucinations, behavioral problems, confusion, dysphoric mood, decreased concentration and agitation.    Objective:   Filed Vitals:   08/22/13 1719  BP: 170/100  Pulse:   Temp:   Resp:     Physical Exam: Constitutional: Patient appears well-developed and well-nourished. No distress. HENT: Normocephalic, atraumatic, External right and left ear normal. Oropharynx is clear and moist. Dental cavities  Eyes: Conjunctivae and EOM are normal. PERRLA, no scleral icterus. Neck: Normal ROM. Neck supple. No  JVD. No tracheal deviation. No thyromegaly. CVS: RRR, S1/S2 +, no murmurs, no gallops, no carotid bruit.  Pulmonary: Effort and breath sounds normal, no stridor, rhonchi, wheezes, rales.  Abdominal: Soft. BS +, no distension, tenderness, rebound or guarding.  Musculoskeletal: Normal range of motion. No edema and no tenderness.    Neuro: Alert. Normal reflexes, muscle tone coordination. No cranial nerve deficit. Skin: Skin is warm and dry. No rash noted. Not diaphoretic. No erythema. No pallor. Psychiatric: Normal mood and affect. Behavior, judgment, thought content normal.  Lab Results  Component Value Date   WBC 5.9 12/08/2008   HGB 10.4* 12/08/2008   HCT 29.3* 12/08/2008   MCV 93.8 12/08/2008   PLT 86* 12/08/2008   Lab Results  Component Value Date   CREATININE 0.92 12/08/2008   BUN 8 12/08/2008   NA 134* 12/08/2008   K 3.7 12/08/2008   CL 110 12/08/2008   CO2 22 12/08/2008    No results found for this basename: HGBA1C   Lipid Panel  No results found for this basename: chol, trig, hdl, cholhdl, vldl, ldlcalc       Assessment and plan:   1. HTN (hypertension) Uncontrolled - cloNIDine (CATAPRES) tablet 0.2 mg; Take 2 tablets (0.2 mg total) by mouth once. COMPLETE METABOLIC PANEL WITH GFR; Future   advised patient for low salt diet, started patient on lisinopril/hydrochlorothiazide, patient will come back in 2 weeks for BP check.   2. Pain, dental/4. Dental cavities  - amoxicillin-clavulanate (AUGMENTIN) 875-125 MG per tablet; Take 1 tablet by mouth 2 (two) times daily.  Dispense: 20 tablet; Refill: 0 - Ambulatory referral to Dentistry  5. Smoking  patient is going to try to quit smoking.  6. Screening  baseline fasting blood work.  - CBC with Differential; Future - Lipid panel; Future - Vit D  25 hydroxy (rtn osteoporosis monitoring); Future - TSH; Future      Return in about 6 weeks (around 10/03/2013) for BP check in 2 weeks .    The patient was given clear instructions to go to ER or return to medical center if symptoms don't improve, worsen or new problems develop. The patient verbalized understanding. The patient was told to call to get lab results if they haven't heard anything in the next week.     Doris CheadleADVANI, Nasha Diss, MD

## 2013-08-22 NOTE — Progress Notes (Signed)
Pt is here to establish care. For three months pt has been having pain in his wisdom tooth on the lower right side.

## 2013-08-23 ENCOUNTER — Other Ambulatory Visit: Payer: Self-pay

## 2013-08-23 DIAGNOSIS — K0889 Other specified disorders of teeth and supporting structures: Secondary | ICD-10-CM

## 2013-08-23 DIAGNOSIS — K029 Dental caries, unspecified: Secondary | ICD-10-CM

## 2013-08-24 ENCOUNTER — Ambulatory Visit: Payer: Self-pay | Admitting: Internal Medicine

## 2013-09-10 ENCOUNTER — Ambulatory Visit: Payer: Self-pay | Attending: Internal Medicine

## 2013-09-10 NOTE — Patient Instructions (Signed)
Pt instructed to take blood pressure medication daily and return next Monday 09/17/2013 with reading log

## 2013-09-28 ENCOUNTER — Other Ambulatory Visit: Payer: Self-pay

## 2013-10-03 ENCOUNTER — Ambulatory Visit: Payer: Self-pay | Admitting: Internal Medicine

## 2013-10-04 ENCOUNTER — Ambulatory Visit: Payer: Self-pay | Admitting: Pharmacist

## 2015-02-24 ENCOUNTER — Encounter: Payer: Self-pay | Admitting: Internal Medicine

## 2015-02-24 ENCOUNTER — Ambulatory Visit: Payer: Self-pay | Attending: Internal Medicine | Admitting: Internal Medicine

## 2015-02-24 VITALS — BP 143/91 | HR 74 | Temp 98.5°F | Resp 18 | Ht 66.0 in | Wt 179.0 lb

## 2015-02-24 DIAGNOSIS — R51 Headache: Secondary | ICD-10-CM | POA: Insufficient documentation

## 2015-02-24 DIAGNOSIS — X58XXXA Exposure to other specified factors, initial encounter: Secondary | ICD-10-CM | POA: Insufficient documentation

## 2015-02-24 DIAGNOSIS — Z720 Tobacco use: Secondary | ICD-10-CM

## 2015-02-24 DIAGNOSIS — Z79899 Other long term (current) drug therapy: Secondary | ICD-10-CM | POA: Insufficient documentation

## 2015-02-24 DIAGNOSIS — T7840XA Allergy, unspecified, initial encounter: Secondary | ICD-10-CM

## 2015-02-24 DIAGNOSIS — F17200 Nicotine dependence, unspecified, uncomplicated: Secondary | ICD-10-CM

## 2015-02-24 DIAGNOSIS — I8500 Esophageal varices without bleeding: Secondary | ICD-10-CM | POA: Insufficient documentation

## 2015-02-24 DIAGNOSIS — T783XXA Angioneurotic edema, initial encounter: Secondary | ICD-10-CM | POA: Insufficient documentation

## 2015-02-24 DIAGNOSIS — Z7952 Long term (current) use of systemic steroids: Secondary | ICD-10-CM | POA: Insufficient documentation

## 2015-02-24 DIAGNOSIS — F17210 Nicotine dependence, cigarettes, uncomplicated: Secondary | ICD-10-CM | POA: Insufficient documentation

## 2015-02-24 DIAGNOSIS — F1010 Alcohol abuse, uncomplicated: Secondary | ICD-10-CM | POA: Insufficient documentation

## 2015-02-24 DIAGNOSIS — I10 Essential (primary) hypertension: Secondary | ICD-10-CM | POA: Insufficient documentation

## 2015-02-24 MED ORDER — AMLODIPINE BESYLATE 5 MG PO TABS: 5.0000 mg | ORAL_TABLET | Freq: Every day | ORAL | Status: DC

## 2015-02-24 MED ORDER — DIPHENHYDRAMINE HCL 50 MG/ML IJ SOLN
25.0000 mg | Freq: Once | INTRAMUSCULAR | Status: AC
  Administered 2015-02-24: 25 mg via INTRAMUSCULAR

## 2015-02-24 NOTE — Patient Instructions (Signed)
Take 1 benadryl tablet tonight around 11 pm. If you are still swollen in the morning please take another benadryl. If you develop shortness of breath, problems swallowing, or further facial swelling..please go to emergency department

## 2015-02-24 NOTE — Progress Notes (Signed)
Pt's here to establish care with new provider. Pt also concern of swelling of upper lip that started a couple hrs ago. Pt took Lisinopril, an of old rx and was concern if that was the reason for swelling, but he wasn't sure if that was the cause of swelling.

## 2015-02-24 NOTE — Progress Notes (Signed)
Patient ID: Joshua Price, male   DOB: 09-Sep-1960, 54 y.o.   MRN: [id number]  CC: facial swelling   HPI: Joshua Price is a 54 y.o. male here today for a follow up visit.  Patient has past medical history of HTN, alcohol abuse, and esophogeal varices. Patient reports that he took lisinopril-HCTZ that expired in February around 7:30 this morning and noticed around 12 noon he began to have lip swelling. He denies eating or drinking anything new around that time. He reports that he has taken this medication in the past without difficulty but decided to start back taking medication because he knew his BP was elevated. He denies SOB, difficulty swallowing, tongue swelling.  Patient reports that he has been having headaches daily for 2 weeks. 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 Reports that he stop drinking 2 weeks ago because he vomited.   No Known Allergies Past Medical History  Diagnosis Date  . Esophageal varices    Current Outpatient Prescriptions on File Prior to Visit  Medication Sig Dispense Refill  . amoxicillin-clavulanate (AUGMENTIN) 875-125 MG per tablet Take 1 tablet by mouth 2 (two) times daily. (Patient not taking: Reported on 02/24/2015) 20 tablet 0  . diphenhydrAMINE (BENADRYL) 25 mg capsule Take 1 capsule (25 mg total) by mouth every 6 (six) hours as needed for itching. (Patient not taking: Reported on 02/24/2015) 30 capsule 0  . lisinopril-hydrochlorothiazide (PRINZIDE,ZESTORETIC) 10-12.5 MG per tablet Take 1 tablet by mouth daily. (Patient not taking: Reported on 02/24/2015) 90 tablet 3  . predniSONE (DELTASONE) 10 MG tablet Take 2 tablets (20 mg total) by mouth daily. (Patient not taking: Reported on 02/24/2015) 10 tablet 0   No current facility-administered medications on file prior to visit.   Family History  Problem Relation Age of Onset  . Cancer Sister   . Hypertension Sister   . Hypertension Mother    Social History   Social History  .  Marital Status: Single    Spouse Name: N/A  . Number of Children: N/A  . Years of Education: N/A   Occupational History  . Not on file.   Social History Main Topics  . Smoking status: Current Every Day Smoker -- 0.25 packs/day    Types: Cigarettes  . Smokeless tobacco: Not on file  . Alcohol Use: 0.0 oz/week    0 Cans of beer per week  . Drug Use: No  . Sexual Activity: Not on file   Other Topics Concern  . Not on file   Social History Narrative    Review of Systems: Other than what is stated in HPI, all other systems are negative.   Objective:   Filed Vitals:   02/24/15 1529  BP: 143/91  Pulse: 74  Temp: 98.5 F (36.9 C)  Resp: 18    Physical Exam  Constitutional: He is oriented to person, place, and time.  HENT:  Mouth/Throat: Uvula is midline and oropharynx is clear and moist. No uvula swelling.  Left sided upper lip swelling   Cardiovascular: Normal rate, regular rhythm and normal heart sounds.   Pulmonary/Chest: Effort normal and breath sounds normal.  Musculoskeletal: He exhibits no edema.  Neurological: He is alert and oriented to person, place, and time.  Skin: Skin is warm and dry.  Psychiatric: He has a normal mood and affect.     Lab Results  Component Value Date   WBC 5.9 12/08/2008   HGB 10.4* 12/08/2008   HCT 29.3* 12/08/2008  MCV 93.8 12/08/2008   PLT 86* 12/08/2008   Lab Results  Component Value Date   CREATININE 0.92 12/08/2008   BUN 8 12/08/2008   NA 134* 12/08/2008   K 3.7 12/08/2008   CL 110 12/08/2008   CO2 22 12/08/2008    No results found for: HGBA1C Lipid Panel  No results found for: CHOL, TRIG, HDL, CHOLHDL, VLDL, LDLCALC     Assessment and plan:   Isam was seen today for oral swelling.  Diagnoses and all orders for this visit:  Allergic reaction, initial encounter/Angioedema  -     diphenhydrAMINE (BENADRYL) injection 25 mg; Inject 0.5 mLs (25 mg total) into the muscle once in office.  Unsure if lip swelling  is related to a try angioedema reaction or if reaction occurred because medication was expired. Will place med on allergy list. He will stop taking lisinopril-HCTZ and discard expired medication. Explained how continuing to take expired medication can be harmful.  Explained signs and symptoms that should warrant immediate attention.  Patient verbalized understanding with teach back used.  Essential hypertension -     Begin amLODipine (NORVASC) 5 MG tablet; Take 1 tablet (5 mg total) by mouth daily. Patient will discard expired medication and begin amlodipine. DASH diet advised, smoking addressed.   Tobacco use disorder Smoking cessation discussed for 3 minutes, patient is not willing to quit at this time. Will continue to assess on each visit. Discussed increased risk for diseases such as cancer, heart disease, and stroke.   Return in about 2 weeks (around 03/10/2015) for on a Monday for lab visit and RN-BP check, 3 mo PCP .       Ambrose Finland, NP-C [facility] [phone] 02/24/2015, 4:41 PM

## 2015-09-23 ENCOUNTER — Encounter (HOSPITAL_COMMUNITY): Payer: Self-pay | Admitting: Emergency Medicine

## 2015-09-23 ENCOUNTER — Emergency Department (INDEPENDENT_AMBULATORY_CARE_PROVIDER_SITE_OTHER)
Admission: EM | Admit: 2015-09-23 | Discharge: 2015-09-23 | Disposition: A | Discharge status: Home / Self Care | Payer: No Typology Code available for payment source | Source: Home / Self Care | Attending: Family Medicine | Admitting: Family Medicine

## 2015-09-23 DIAGNOSIS — T464X5A Adverse effect of angiotensin-converting-enzyme inhibitors, initial encounter: Secondary | ICD-10-CM

## 2015-09-23 DIAGNOSIS — T783XXA Angioneurotic edema, initial encounter: Secondary | ICD-10-CM

## 2015-09-23 DIAGNOSIS — R220 Localized swelling, mass and lump, head: Secondary | ICD-10-CM

## 2015-09-23 NOTE — Discharge Instructions (Signed)
Angioedema STOP taking your blood pressure medicine lisinopril/HCTZ Call or see your doctor tomorrow If worsening, more swelling, cough, trouble breathing or swallowing, change in voice, swelling of tongue or throat go to the ED or call EMS Angioedema is a sudden swelling of tissues, often of the skin. It can occur on the face or genitals or in the abdomen or other body parts. The swelling usually develops over a short period and gets better in 24 to 48 hours. It often begins during the night and is found when the person wakes up. The person may also get red, itchy patches of skin (hives). Angioedema can be dangerous if it involves swelling of the air passages.  Depending on the cause, episodes of angioedema may only happen once, come back in unpredictable patterns, or repeat for several years and then gradually fade away.  CAUSES  Angioedema can be caused by an allergic reaction to various triggers. It can also result from nonallergic causes, including reactions to drugs, immune system disorders, viral infections, or an abnormal gene that is passed to you from your parents (hereditary). For some people with angioedema, the cause is unknown.  Some things that can trigger angioedema include:   Foods.   Medicines, such as ACE inhibitors, ARBs, nonsteroidal anti-inflammatory agents, or estrogen.   Latex.   Animal saliva.   Insect stings.   Dyes used in X-rays.   Mild injury.   Dental work.  Surgery.  Stress.   Sudden changes in temperature.   Exercise. SIGNS AND SYMPTOMS   Swelling of the skin.  Hives. If these are present, there is also intense itching.  Redness in the affected area.   Pain in the affected area.  Swollen lips or tongue.  Breathing problems. This may happen if the air passages swell.  Wheezing. If internal organs are involved, there may be:   Nausea.   Abdominal pain.   Vomiting.   Difficulty swallowing.   Difficulty passing  urine. DIAGNOSIS   Your health care provider will examine the affected area and take a medical and family history.  Various tests may be done to help determine the cause. Tests may include:  Allergy skin tests to see if the problem is an allergic reaction.   Blood tests to check for hereditary angioedema.   Tests to check for underlying diseases that could cause the condition.   A review of your medicines, including over-the-counter medicines, may be done. TREATMENT  Treatment will depend on the cause of the angioedema. Possible treatments include:   Removal of anything that triggered the condition (such as stopping certain medicines).   Medicines to treat symptoms or prevent attacks. Medicines given may include:   Antihistamines.   Epinephrine injection.   Steroids.   Hospitalization may be required for severe attacks. If the air passages are affected, it can be an emergency. Tubes may need to be placed to keep the airway open. HOME CARE INSTRUCTIONS   Take all medicines as directed by your health care provider.  If you were given medicines for emergency allergy treatment, always carry them with you.  Wear a medical bracelet as directed by your health care provider.   Avoid known triggers. SEEK MEDICAL CARE IF:   You have repeat attacks of angioedema.   Your attacks are more frequent or more severe despite preventive measures.   You have hereditary angioedema and are considering having children. It is important to discuss with your health care provider the risks of passing the condition  on to your children. SEEK IMMEDIATE MEDICAL CARE IF:   You have severe swelling of the mouth, tongue, or lips.  You have difficulty breathing.   You have difficulty swallowing.   You faint. MAKE SURE YOU:  Understand these instructions.  Will watch your condition.  Will get help right away if you are not doing well or get worse.   This information is not  intended to replace advice given to you by your health care provider. Make sure you discuss any questions you have with your health care provider.   Document Released: 09/06/2001 Document Revised: 07/19/2014 Document Reviewed: 02/19/2013 Elsevier Interactive Patient Education 2016 [place] Allergy A drug allergy means you have a strange reaction to a medicine. You may have puffiness (swelling), itching, red rashes, and hives. Some allergic reactions can be life-threatening. HOME CARE  If you do not know what caused your reaction:  Write down medicines you use.  Write down any problems you have after using medicine.  Avoid things that cause a reaction.  You can see an allergy doctor to be tested for allergies. If you have hives or a rash:  Take medicine as told by your doctor.  Place cold cloths on your skin.  Do not take hot baths or hot showers. Take baths in cool water. If you are severely allergic:  Wear a medical bracelet or necklace that lists your allergy.  Carry your allergy kit or medicine shot to treat severe allergic reactions with you. These can save your life.  Do not drive until medicine from your shot has worn off, unless your doctor says it is okay. GET HELP RIGHT AWAY IF:   Your mouth is puffy, or you have trouble breathing.  You have a tight feeling in your chest or throat.  You have hives, puffiness, or itching all over your body.  You throw up (vomit) or have watery poop (diarrhea).  You feel dizzy or pass out (faint).  You think you are having a reaction. Problems often start within 30 minutes after taking a medicine.  You are getting worse, not better.  You have new problems.  Your problems go away and then come back. This is an emergency. Use your medicine shot or allergy kit as told. Call yourlocal emergency services (911 in [place] after the shot. Even if you feel better after the shot, you need to go to the hospital. You may need  more medicine to control a severe reaction. MAKE SURE YOU:  Understand these instructions.  Will watch your condition.  Will get help right away if you are not doing well or get worse.   This information is not intended to replace advice given to you by your health care provider. Make sure you discuss any questions you have with your health care provider.   Document Released: 08/05/2004 Document Revised: 09/20/2011 Document Reviewed: 01/28/2015 Elsevier Interactive Patient Education [employer].

## 2015-09-23 NOTE — ED Provider Notes (Signed)
CSN: [id number]     Arrival date & time 09/23/15  1453 History   First MD Initiated Contact with Patient 09/23/15 1710     Chief Complaint  Patient presents with  . Facial Swelling   (Consider location/radiation/quality/duration/timing/severity/associated sxs/prior Treatment) HPI Comments: 55 year old male presents with facial swelling that began yesterday. He states that the swelling began around his mouth and he had moderate to severe swelling that today he had spread to his face. By the time he came to the urgent care he states swelling around his lips has decreased substantially but there continues to be some swelling. He never did have swelling of the tongue or other intraoral structures. Denies swelling of the neck or peripheral swelling. Denies itching or rash. Denies cough or trouble breathing. Recently his lisinopril HCT was increased.   Past Medical History  Diagnosis Date  . Esophageal varices (HCC)    History reviewed. No pertinent past surgical history. Family History  Problem Relation Age of Onset  . Cancer Sister   . Hypertension Sister   . Hypertension Mother    Social History  Substance Use Topics  . Smoking status: Current Every Day Smoker -- 0.25 packs/day    Types: Cigarettes  . Smokeless tobacco: None  . Alcohol Use: 0.0 oz/week    0 Cans of beer per week    Review of Systems  Constitutional: Negative.   HENT: Positive for facial swelling. Negative for congestion, nosebleeds, postnasal drip, rhinorrhea, sneezing and trouble swallowing.   Eyes: Negative.   Respiratory: Negative for cough, choking, chest tightness, shortness of breath, wheezing and stridor.   Cardiovascular: Negative for chest pain and leg swelling.  Gastrointestinal: Negative.   Musculoskeletal: Negative.   Skin: Negative for color change, rash and wound.  Neurological: Negative.   Psychiatric/Behavioral: Negative.   All other systems reviewed and are negative.   Allergies   Lisinopril  Home Medications   Prior to Admission medications   Medication Sig Start Date End Date Taking? Authorizing Provider  amLODipine (NORVASC) 5 MG tablet Take 1 tablet (5 mg total) by mouth daily. 02/24/15   Ambrose Finland, NP  amoxicillin-clavulanate (AUGMENTIN) 875-125 MG per tablet Take 1 tablet by mouth 2 (two) times daily. Patient not taking: Reported on 02/24/2015 08/22/13   Doris Cheadle, MD  diphenhydrAMINE (BENADRYL) 25 mg capsule Take 1 capsule (25 mg total) by mouth every 6 (six) hours as needed for itching. Patient not taking: Reported on 02/24/2015 05/12/13   Rolland Porter, MD  predniSONE (DELTASONE) 10 MG tablet Take 2 tablets (20 mg total) by mouth daily. Patient not taking: Reported on 02/24/2015 05/12/13   Rolland Porter, MD   Meds Ordered and Administered this Visit  Medications - No data to display  BP 151/99 mmHg  Pulse 77  Temp(Src) 97.9 F (36.6 C) (Oral)  Resp 18  SpO2 97% No data found.   Physical Exam  Constitutional: He is oriented to person, place, and time. He appears well-developed and well-nourished. No distress.  HENT:  Head: Normocephalic and atraumatic.  Mouth/Throat: Oropharynx is clear and moist.  There is mild to moderate swelling of the upper and lower lip. There is no swelling, erythema or enlargement of the tongue, soft palate, uvula or other intraoral or pharyngeal structures. Swallowing reflex is smooth and without difficulty.  Eyes: Conjunctivae and EOM are normal.  Neck: Normal range of motion. Neck supple.  Cardiovascular: Normal rate, regular rhythm and normal heart sounds.   Pulmonary/Chest: Effort normal and breath sounds  normal. No respiratory distress. He has no wheezes. He has no rales.  Musculoskeletal:  No edema other than around the face and lips. No peripheral edema. No rash. No pruritus.  Lymphadenopathy:    He has no cervical adenopathy.  Neurological: He is alert and oriented to person, place, and time. No cranial nerve  deficit. He exhibits normal muscle tone.  Skin: Skin is warm and dry. No rash noted. No erythema.  Nursing note and vitals reviewed.   ED Course  Procedures (including critical care time)  Labs Review Labs Reviewed - No data to display  Imaging Review No results found.   Visual Acuity Review  Right Eye Distance:   Left Eye Distance:   Bilateral Distance:    Right Eye Near:   Left Eye Near:    Bilateral Near:         MDM   1. Facial swelling   2. ACE inhibitor-aggravated angioedema, initial encounter    STOP taking your blood pressure medicine lisinopril/HCTZ Call or see your doctor tomorrow If worsening, more swelling, cough, trouble breathing or swallowing, change in voice, swelling of tongue or throat go to the ED or call EMS     Hayden Rasmussenavid Janziel Hockett, NP 09/23/15 1824

## 2015-09-23 NOTE — ED Notes (Signed)
Patient has facial swelling that started yesterday.  Patient denies tongue or throat swelling.  Patient has swelling from just under his eyes downward.  Patient has been on lisinopril-hctz, dose for medicine increased on 09/12/15

## 2017-07-05 ENCOUNTER — Encounter (HOSPITAL_COMMUNITY): Payer: Self-pay | Admitting: Anesthesiology

## 2017-07-05 ENCOUNTER — Ambulatory Visit (HOSPITAL_COMMUNITY)
Admission: EM | Admit: 2017-07-05 | Discharge: 2017-07-05 | Disposition: A | Discharge status: Home / Self Care | Payer: No Typology Code available for payment source | Attending: Emergency Medicine | Admitting: Emergency Medicine

## 2017-07-05 ENCOUNTER — Other Ambulatory Visit: Payer: Self-pay

## 2017-07-05 ENCOUNTER — Encounter (HOSPITAL_COMMUNITY): Payer: Self-pay

## 2017-07-05 ENCOUNTER — Encounter (HOSPITAL_COMMUNITY)
Admission: EM | Disposition: A | Discharge status: Home / Self Care | Payer: Self-pay | Source: Home / Self Care | Attending: Emergency Medicine

## 2017-07-05 DIAGNOSIS — Z79899 Other long term (current) drug therapy: Secondary | ICD-10-CM | POA: Insufficient documentation

## 2017-07-05 DIAGNOSIS — F17210 Nicotine dependence, cigarettes, uncomplicated: Secondary | ICD-10-CM | POA: Insufficient documentation

## 2017-07-05 DIAGNOSIS — Z888 Allergy status to other drugs, medicaments and biological substances status: Secondary | ICD-10-CM | POA: Insufficient documentation

## 2017-07-05 DIAGNOSIS — Z8249 Family history of ischemic heart disease and other diseases of the circulatory system: Secondary | ICD-10-CM | POA: Insufficient documentation

## 2017-07-05 DIAGNOSIS — Z7982 Long term (current) use of aspirin: Secondary | ICD-10-CM | POA: Insufficient documentation

## 2017-07-05 DIAGNOSIS — Z538 Procedure and treatment not carried out for other reasons: Secondary | ICD-10-CM | POA: Insufficient documentation

## 2017-07-05 DIAGNOSIS — K222 Esophageal obstruction: Secondary | ICD-10-CM

## 2017-07-05 DIAGNOSIS — X58XXXA Exposure to other specified factors, initial encounter: Secondary | ICD-10-CM | POA: Insufficient documentation

## 2017-07-05 DIAGNOSIS — T18128A Food in esophagus causing other injury, initial encounter: Secondary | ICD-10-CM | POA: Insufficient documentation

## 2017-07-05 DIAGNOSIS — I10 Essential (primary) hypertension: Secondary | ICD-10-CM | POA: Insufficient documentation

## 2017-07-05 SURGERY — CANCELLED PROCEDURE

## 2017-07-05 MED ORDER — GLUCAGON HCL RDNA (DIAGNOSTIC) 1 MG IJ SOLR
1.0000 mg | Freq: Once | INTRAMUSCULAR | Status: AC
  Administered 2017-07-05: 1 mg via INTRAVENOUS
  Filled 2017-07-05: qty 1

## 2017-07-05 MED ORDER — SODIUM CHLORIDE 0.9 % IV SOLN
INTRAVENOUS | Status: DC
Start: 2017-07-05 — End: 2017-07-05

## 2017-07-05 MED ORDER — NITROGLYCERIN 0.4 MG SL SUBL
0.4000 mg | SUBLINGUAL_TABLET | Freq: Once | SUBLINGUAL | Status: AC
  Administered 2017-07-05: 0.4 mg via SUBLINGUAL
  Filled 2017-07-05: qty 1

## 2017-07-05 SURGICAL SUPPLY — 15 items

## 2017-07-05 NOTE — ED Notes (Signed)
ED Provider at bedside. 

## 2017-07-05 NOTE — ED Notes (Signed)
MD Buccini in pt room talking with pt.

## 2017-07-05 NOTE — ED Notes (Signed)
Notified MD Buccini via phone, MD instructed RN to administer about 8oz of warm water for pt to drink. [facility] and will make his way down to see pt and to potentially close out case and prepare pt for discharge.

## 2017-07-05 NOTE — ED Triage Notes (Signed)
Pt called 911. Pt was eating roast that wife cooked and pt believes he has something stuck in throat. Pt stated he threw up a [place] and their was a Mourer blood. Pt continues to dry heave here and their every couple minutes. Pt is able to talk and move air.

## 2017-07-05 NOTE — ED Notes (Signed)
Pt stated he says the lump in his throat is gone, pt states he can swallow and he knows lump is gone. Contacting MD Buccini via phone [phone]

## 2017-07-05 NOTE — ED Notes (Signed)
Bed: WA20 Expected date:  Expected time:  Means of arrival:  Comments: Ems-choking

## 2017-07-05 NOTE — ED Provider Notes (Signed)
[facility]-EMERGENCY DEPT Provider Note   CSN: [id number] Arrival date & time: 07/05/17  1142     History   Chief Complaint Chief Complaint  Patient presents with  . Choking    HPI Joshua Price is a 56 y.o. male.  HPI 56 year old male who presents with esophageal food impaction.  He reports eating roast beef just prior to episode of choking.  He subsequently tried to swallow extra roast to push the meat down.  Since then he has had a foreign body sensation in his throat.  Has been spitting up his saliva.  No difficulty breathing.  Previously in his usual state of health before that. History of EV and HTN, no prior liver disease.    Past Medical History:  Diagnosis Date  . Esophageal varices [facility](HCC)     Patient Active Problem List   Diagnosis Date Noted  . Essential hypertension, benign 08/22/2013  . Smoking 08/22/2013  . Dental cavities 08/22/2013    History reviewed. No pertinent surgical history.     Home Medications    Prior to Admission medications   Medication Sig Start Date End Date Taking? Authorizing Provider  amLODipine (NORVASC) 5 MG tablet Take 1 tablet (5 mg total) by mouth daily. Patient taking differently: Take 10 mg by mouth daily.  02/24/15  Yes Ambrose FinlandKeck, Valerie A, NP  aspirin EC 81 MG tablet Take 81 mg by mouth daily.   Yes [provider]    Family History Family History  Problem Relation Age of Onset  . Cancer Sister   . Hypertension Sister   . Hypertension Mother     Social History Social History   Tobacco Use  . Smoking status: Current Every Day Smoker    Packs/day: 0.25    Types: Cigarettes  . Smokeless tobacco: Never Used  Substance Use Topics  . Alcohol use: Yes    Alcohol/week: 0.0 oz  . Drug use: No     Allergies   Lisinopril   Review of Systems Review of Systems  Respiratory: Negative for shortness of breath.   Cardiovascular: Negative for chest pain.  Gastrointestinal: Negative for  abdominal pain and vomiting.  All other systems reviewed and are negative.    Physical Exam Updated Vital Signs BP 110/84   Pulse 77   Temp 98.2 F (36.8 C) (Oral)   Resp (!) 22   Ht 5\' 6"  (1.676 m)   Wt 65.8 kg (145 lb)   SpO2 94%   BMI 23.40 kg/m   Physical Exam Physical Exam  Nursing note and vitals reviewed. Constitutional: Well developed, well nourished, non-toxic, and occasional esophageal spasm during exam Head: Normocephalic and atraumatic.  Mouth/Throat: Oropharynx is clear and moist. no visualized foreign body. Pooling of saliva in mouth.  Neck: Normal range of motion. Neck supple.  Cardiovascular: Normal rate and regular rhythm.   Pulmonary/Chest: Effort normal and breath sounds normal.  Abdominal: Soft. There is no tenderness. There is no rebound and no guarding.  Musculoskeletal: Normal range of motion.  Neurological: Alert, no facial droop, fluent speech, moves all extremities symmetrically Skin: Skin is warm and dry.  Psychiatric: Cooperative   ED Treatments / Results  Labs (all labs ordered are listed, but only abnormal results are displayed) Labs Reviewed - No data to display  EKG  EKG Interpretation None       Radiology No results found.  Procedures Procedures (including critical care time)  Medications Ordered in ED Medications  0.9 %  sodium chloride  infusion ( Intravenous Not Given 07/05/17 1410)  glucagon (human recombinant) (GLUCAGEN) injection 1 mg (1 mg Intravenous Given 07/05/17 1220)  nitroGLYCERIN (NITROSTAT) SL tablet 0.4 mg (0.4 mg Sublingual Given 07/05/17 1402)     Initial Impression / Assessment and Plan / ED Course  I have reviewed the triage vital signs and the nursing notes.  Pertinent labs & imaging results that were available during my care of the patient were reviewed by me and considered in my medical decision making (see chart for details).     56 year old male who presents with esophageal food impaction.   Unable to handle secretions, but breathing comfortably and without any respiratory distress.  Given glucagon without good effect.  Discussed with Dr. Matthias HughsBuccini who is seen him here in the ED.  A trial of nitroglycerin was subsequently given and several minutes later he was able to pass his impaction.  Afterwards, patient reports feeling improved.  He is back to baseline.  Referral to GI given as needed. Strict return and follow-up instructions reviewed. He expressed understanding of all discharge instructions and felt comfortable with the plan of care.   Final Clinical Impressions(s) / ED Diagnoses   Final diagnoses:  Esophageal obstruction due to food impaction    ED Discharge Orders    None       Lavera GuiseLiu, Laprecious Austill Duo, MD 07/05/17 1739

## 2017-07-05 NOTE — Discharge Instructions (Signed)
You had a food impaction in the esophagus that has resolved. You are given phone number for gastroenterology if you would like to follow-up. You can also follow-up with PCP. Please return without fail for worsening symptoms, including recurrent food impaction in the esophagus, difficulty breathing, difficulty swallowing or any other symptoms concerning to you.

## 2017-07-05 NOTE — Consult Note (Addendum)
Referring Provider:  Dr. Verdie MosherLiu [facility]) Primary Care Physician:  Joshua Price, Anthony, FNP Primary Gastroenterologist:  None (unassigned--saw Dr. Virginia Rochesterrr years ago)  Reason for Consultation:  Food impaction   HPI: Joshua Price is a 56 y.o. male w/ remote h/o varices and h/o moderately heavy EtOH, had first-ever food impaction (roast beef) today.  No antecedent dysphagia sx, no chron reflux, no anorexia/ wt loss.   Past Medical History:  Diagnosis Date  . Esophageal varices (HCC)     History reviewed. No pertinent surgical history.  Prior to Admission medications   Medication Sig Start Date End Date Taking? Authorizing Provider  amLODipine (NORVASC) 5 MG tablet Take 1 tablet (5 mg total) by mouth daily. Patient taking differently: Take 10 mg by mouth daily.  02/24/15  Yes Ambrose FinlandKeck, Valerie A, NP  aspirin EC 81 MG tablet Take 81 mg by mouth daily.   Yes [provider]    Current Facility-Administered Medications  Medication Dose Route Frequency Provider Last Rate Last Dose  . 0.9 %  sodium chloride infusion   Intravenous Continuous Raechel Marcos, Molly Maduroobert, MD       Current Outpatient Medications  Medication Sig Dispense Refill  . amLODipine (NORVASC) 5 MG tablet Take 1 tablet (5 mg total) by mouth daily. (Patient taking differently: Take 10 mg by mouth daily. ) 30 tablet 3  . aspirin EC 81 MG tablet Take 81 mg by mouth daily.      Allergies as of 07/05/2017 - Review Complete 07/05/2017  Allergen Reaction Noted  . Lisinopril Swelling 03/05/2015    Family History  Problem Relation Age of Onset  . Cancer Sister   . Hypertension Sister   . Hypertension Mother     Social History   Socioeconomic History  . Marital status: Single    Spouse name: Not on file  . Number of children: Not on file  . Years of education: Not on file  . Highest education level: Not on file  Social Needs  . Financial resource strain: Not on file  . Food insecurity - worry: Not on file  . Food insecurity -  inability: Not on file  . Transportation needs - medical: Not on file  . Transportation needs - non-medical: Not on file  Occupational History  . Not on file  Tobacco Use  . Smoking status: Current Every Day Smoker    Packs/day: 0.25    Types: Cigarettes  . Smokeless tobacco: Never Used  Substance and Sexual Activity  . Alcohol use: Yes    Alcohol/week: 0.0 oz  . Drug use: No  . Sexual activity: Not Currently    Birth control/protection: None  Other Topics Concern  . Not on file  Social History Narrative  . Not on file    Review of Systems: see HPI  Physical Exam: Vital signs in last 24 hours: Temp:  [98.2 F (36.8 C)] 98.2 F (36.8 C) (12/25 1400) Pulse Rate:  [77-113] 77 (12/25 1409) Resp:  [19-24] 22 (12/25 1409) BP: (110-148)/(79-95) 110/84 (12/25 1409) SpO2:  [94 %-100 %] 94 % (12/25 1409) Weight:  [65.8 kg (145 lb)] 65.8 kg (145 lb) (12/25 1156)   Well-nourished AAM expectorating mucus frequently. Intake/Output from previous day: No intake/output data recorded. Intake/Output this shift: No intake/output data recorded.  Lab Results: No results for input(s): WBC, HGB, HCT, PLT in the last 72 hours. BMET No results for input(s): NA, K, CL, CO2, GLUCOSE, BUN, CREATININE, CALCIUM in the last 72 hours. LFT No results for  input(s): PROT, ALBUMIN, AST, ALT, ALKPHOS, BILITOT, BILIDIR, IBILI in the last 72 hours. PT/INR No results for input(s): LABPROT, INR in the last 72 hours.  Studies/Results: No results found.  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  Resolution of the food impaction was confirmed by having the  patient consumed water, which went down without any difficulty.  In addition, he subjectively felt improved.  Therefore, endoscopic evaluation on an urgent basis in the emergency room was not felt to be needed, and the patient was released to home.  Prior to discharge, I did explain that he probably has an anatomic condition such as Schatzki's ring which may put him at risk for future food impactions, therefore, I encouraged him to follow-up with me in the office at his convenience to discuss getting a barium swallow, using ongoing PPI therapy, etc. as standard management for patients with intermittent dysphagia.  I provided my card for this purpose.  However, I also indicated that such follow-up is not required, and if he prefers to simply follow his symptoms expectantly, that would be his choice, because I am confident based on his history that he is not harboring esophageal cancer.

## 2021-09-05 ENCOUNTER — Inpatient Hospital Stay (HOSPITAL_COMMUNITY): Payer: Medicaid Other

## 2021-09-05 ENCOUNTER — Other Ambulatory Visit: Payer: Self-pay

## 2021-09-05 ENCOUNTER — Emergency Department (HOSPITAL_COMMUNITY): Payer: Medicaid Other

## 2021-09-05 ENCOUNTER — Inpatient Hospital Stay (HOSPITAL_COMMUNITY)
Admission: EM | Admit: 2021-09-05 | Discharge: 2021-10-01 | DRG: 064 | Disposition: A | Discharge status: Skilled Nursing Facility | Payer: Medicaid Other | Attending: Internal Medicine | Admitting: Internal Medicine

## 2021-09-05 ENCOUNTER — Encounter (HOSPITAL_COMMUNITY): Payer: Self-pay

## 2021-09-05 DIAGNOSIS — R451 Restlessness and agitation: Secondary | ICD-10-CM | POA: Diagnosis not present

## 2021-09-05 DIAGNOSIS — Y92008 Other place in unspecified non-institutional (private) residence as the place of occurrence of the external cause: Secondary | ICD-10-CM | POA: Diagnosis not present

## 2021-09-05 DIAGNOSIS — E559 Vitamin D deficiency, unspecified: Secondary | ICD-10-CM | POA: Diagnosis present

## 2021-09-05 DIAGNOSIS — R531 Weakness: Secondary | ICD-10-CM | POA: Diagnosis not present

## 2021-09-05 DIAGNOSIS — K0889 Other specified disorders of teeth and supporting structures: Secondary | ICD-10-CM | POA: Diagnosis present

## 2021-09-05 DIAGNOSIS — R509 Fever, unspecified: Secondary | ICD-10-CM | POA: Diagnosis not present

## 2021-09-05 DIAGNOSIS — F10239 Alcohol dependence with withdrawal, unspecified: Secondary | ICD-10-CM | POA: Diagnosis not present

## 2021-09-05 DIAGNOSIS — I610 Nontraumatic intracerebral hemorrhage in hemisphere, subcortical: Secondary | ICD-10-CM | POA: Diagnosis present

## 2021-09-05 DIAGNOSIS — Z888 Allergy status to other drugs, medicaments and biological substances status: Secondary | ICD-10-CM

## 2021-09-05 DIAGNOSIS — G936 Cerebral edema: Secondary | ICD-10-CM | POA: Diagnosis present

## 2021-09-05 DIAGNOSIS — Z6823 Body mass index (BMI) 23.0-23.9, adult: Secondary | ICD-10-CM

## 2021-09-05 DIAGNOSIS — F17210 Nicotine dependence, cigarettes, uncomplicated: Secondary | ICD-10-CM | POA: Diagnosis present

## 2021-09-05 DIAGNOSIS — Z4659 Encounter for fitting and adjustment of other gastrointestinal appliance and device: Secondary | ICD-10-CM

## 2021-09-05 DIAGNOSIS — Z781 Physical restraint status: Secondary | ICD-10-CM

## 2021-09-05 DIAGNOSIS — G9349 Other encephalopathy: Secondary | ICD-10-CM | POA: Diagnosis present

## 2021-09-05 DIAGNOSIS — K5900 Constipation, unspecified: Secondary | ICD-10-CM | POA: Diagnosis not present

## 2021-09-05 DIAGNOSIS — E440 Moderate protein-calorie malnutrition: Secondary | ICD-10-CM | POA: Diagnosis present

## 2021-09-05 DIAGNOSIS — J690 Pneumonitis due to inhalation of food and vomit: Secondary | ICD-10-CM | POA: Diagnosis not present

## 2021-09-05 DIAGNOSIS — Z20822 Contact with and (suspected) exposure to covid-19: Secondary | ICD-10-CM | POA: Diagnosis present

## 2021-09-05 DIAGNOSIS — R29810 Facial weakness: Secondary | ICD-10-CM | POA: Diagnosis present

## 2021-09-05 DIAGNOSIS — Z79899 Other long term (current) drug therapy: Secondary | ICD-10-CM

## 2021-09-05 DIAGNOSIS — R1312 Dysphagia, oropharyngeal phase: Secondary | ICD-10-CM | POA: Diagnosis not present

## 2021-09-05 DIAGNOSIS — F10939 Alcohol use, unspecified with withdrawal, unspecified: Secondary | ICD-10-CM | POA: Diagnosis present

## 2021-09-05 DIAGNOSIS — G935 Compression of brain: Secondary | ICD-10-CM | POA: Diagnosis present

## 2021-09-05 DIAGNOSIS — R7401 Elevation of levels of liver transaminase levels: Secondary | ICD-10-CM | POA: Diagnosis present

## 2021-09-05 DIAGNOSIS — Z7189 Other specified counseling: Secondary | ICD-10-CM | POA: Diagnosis not present

## 2021-09-05 DIAGNOSIS — E870 Hyperosmolality and hypernatremia: Secondary | ICD-10-CM | POA: Diagnosis not present

## 2021-09-05 DIAGNOSIS — G8191 Hemiplegia, unspecified affecting right dominant side: Secondary | ICD-10-CM | POA: Diagnosis present

## 2021-09-05 DIAGNOSIS — E871 Hypo-osmolality and hyponatremia: Secondary | ICD-10-CM | POA: Diagnosis present

## 2021-09-05 DIAGNOSIS — R4189 Other symptoms and signs involving cognitive functions and awareness: Secondary | ICD-10-CM | POA: Diagnosis present

## 2021-09-05 DIAGNOSIS — R739 Hyperglycemia, unspecified: Secondary | ICD-10-CM | POA: Diagnosis not present

## 2021-09-05 DIAGNOSIS — I8500 Esophageal varices without bleeding: Secondary | ICD-10-CM | POA: Diagnosis present

## 2021-09-05 DIAGNOSIS — G9341 Metabolic encephalopathy: Secondary | ICD-10-CM | POA: Diagnosis present

## 2021-09-05 DIAGNOSIS — R29717 NIHSS score 17: Secondary | ICD-10-CM | POA: Diagnosis present

## 2021-09-05 DIAGNOSIS — F10930 Alcohol use, unspecified with withdrawal, uncomplicated: Secondary | ICD-10-CM | POA: Diagnosis not present

## 2021-09-05 DIAGNOSIS — R471 Dysarthria and anarthria: Secondary | ICD-10-CM | POA: Diagnosis present

## 2021-09-05 DIAGNOSIS — Y907 Blood alcohol level of 200-239 mg/100 ml: Secondary | ICD-10-CM | POA: Diagnosis present

## 2021-09-05 DIAGNOSIS — R4701 Aphasia: Secondary | ICD-10-CM | POA: Diagnosis present

## 2021-09-05 DIAGNOSIS — Z978 Presence of other specified devices: Secondary | ICD-10-CM

## 2021-09-05 DIAGNOSIS — R4182 Altered mental status, unspecified: Secondary | ICD-10-CM

## 2021-09-05 DIAGNOSIS — R414 Neurologic neglect syndrome: Secondary | ICD-10-CM | POA: Diagnosis present

## 2021-09-05 DIAGNOSIS — S40011A Contusion of right shoulder, initial encounter: Secondary | ICD-10-CM | POA: Diagnosis present

## 2021-09-05 DIAGNOSIS — W19XXXA Unspecified fall, initial encounter: Secondary | ICD-10-CM | POA: Diagnosis present

## 2021-09-05 DIAGNOSIS — I161 Hypertensive emergency: Secondary | ICD-10-CM | POA: Diagnosis present

## 2021-09-05 DIAGNOSIS — E876 Hypokalemia: Secondary | ICD-10-CM | POA: Diagnosis not present

## 2021-09-05 DIAGNOSIS — Z515 Encounter for palliative care: Secondary | ICD-10-CM

## 2021-09-05 DIAGNOSIS — F10229 Alcohol dependence with intoxication, unspecified: Secondary | ICD-10-CM | POA: Diagnosis present

## 2021-09-05 DIAGNOSIS — T17908A Unspecified foreign body in respiratory tract, part unspecified causing other injury, initial encounter: Secondary | ICD-10-CM

## 2021-09-05 DIAGNOSIS — I10 Essential (primary) hypertension: Secondary | ICD-10-CM | POA: Diagnosis present

## 2021-09-05 DIAGNOSIS — Z7982 Long term (current) use of aspirin: Secondary | ICD-10-CM

## 2021-09-05 DIAGNOSIS — I619 Nontraumatic intracerebral hemorrhage, unspecified: Secondary | ICD-10-CM | POA: Diagnosis present

## 2021-09-05 DIAGNOSIS — Z8249 Family history of ischemic heart disease and other diseases of the circulatory system: Secondary | ICD-10-CM

## 2021-09-05 DIAGNOSIS — Z0189 Encounter for other specified special examinations: Secondary | ICD-10-CM

## 2021-09-05 LAB — DIFFERENTIAL
Abs Immature Granulocytes: 0.02 10*3/uL (ref 0.00–0.07)
Basophils Absolute: 0 10*3/uL (ref 0.0–0.1)
Basophils Relative: 1 %
Eosinophils Absolute: 0 10*3/uL (ref 0.0–0.5)
Eosinophils Relative: 1 %
Immature Granulocytes: 1 %
Lymphocytes Relative: 45 %
Lymphs Abs: 2 10*3/uL (ref 0.7–4.0)
Monocytes Absolute: 0.5 10*3/uL (ref 0.1–1.0)
Monocytes Relative: 12 %
Neutro Abs: 1.7 10*3/uL (ref 1.7–7.7)
Neutrophils Relative %: 40 %

## 2021-09-05 LAB — URINALYSIS, ROUTINE W REFLEX MICROSCOPIC
Bilirubin Urine: NEGATIVE
Glucose, UA: NEGATIVE mg/dL
Ketones, ur: NEGATIVE mg/dL
Leukocytes,Ua: NEGATIVE
Nitrite: NEGATIVE
Protein, ur: NEGATIVE mg/dL
Specific Gravity, Urine: 1.005 (ref 1.005–1.030)
pH: 5 (ref 5.0–8.0)

## 2021-09-05 LAB — POCT I-STAT 7, (LYTES, BLD GAS, ICA,H+H)
Acid-base deficit: 4 mmol/L — ABNORMAL HIGH (ref 0.0–2.0)
Bicarbonate: 21.9 mmol/L (ref 20.0–28.0)
Calcium, Ion: 1.16 mmol/L (ref 1.15–1.40)
HCT: 42 % (ref 39.0–52.0)
Hemoglobin: 14.3 g/dL (ref 13.0–17.0)
O2 Saturation: 94 %
Patient temperature: 98.6
Potassium: 3.8 mmol/L (ref 3.5–5.1)
Sodium: 136 mmol/L (ref 135–145)
TCO2: 23 mmol/L (ref 22–32)
pCO2 arterial: 41 mmHg (ref 32–48)
pH, Arterial: 7.336 — ABNORMAL LOW (ref 7.35–7.45)
pO2, Arterial: 76 mmHg — ABNORMAL LOW (ref 83–108)

## 2021-09-05 LAB — CBC
HCT: 38 % — ABNORMAL LOW (ref 39.0–52.0)
Hemoglobin: 12.9 g/dL — ABNORMAL LOW (ref 13.0–17.0)
MCH: 32.4 pg (ref 26.0–34.0)
MCHC: 33.9 g/dL (ref 30.0–36.0)
MCV: 95.5 fL (ref 80.0–100.0)
Platelets: 112 10*3/uL — ABNORMAL LOW (ref 150–400)
RBC: 3.98 MIL/uL — ABNORMAL LOW (ref 4.22–5.81)
RDW: 13.5 % (ref 11.5–15.5)
WBC: 4.3 10*3/uL (ref 4.0–10.5)
nRBC: 0 % (ref 0.0–0.2)

## 2021-09-05 LAB — I-STAT CHEM 8, ED
BUN: 18 mg/dL (ref 6–20)
Calcium, Ion: 1.03 mmol/L — ABNORMAL LOW (ref 1.15–1.40)
Chloride: 98 mmol/L (ref 98–111)
Creatinine, Ser: 1.1 mg/dL (ref 0.61–1.24)
Glucose, Bld: 83 mg/dL (ref 70–99)
HCT: 44 % (ref 39.0–52.0)
Hemoglobin: 15 g/dL (ref 13.0–17.0)
Potassium: 4.1 mmol/L (ref 3.5–5.1)
Sodium: 132 mmol/L — ABNORMAL LOW (ref 135–145)
TCO2: 24 mmol/L (ref 22–32)

## 2021-09-05 LAB — RESP PANEL BY RT-PCR (FLU A&B, COVID) ARPGX2
Influenza A by PCR: NEGATIVE
Influenza B by PCR: NEGATIVE
SARS Coronavirus 2 by RT PCR: NEGATIVE

## 2021-09-05 LAB — PROTIME-INR
INR: 1.1 (ref 0.8–1.2)
Prothrombin Time: 13.7 seconds (ref 11.4–15.2)

## 2021-09-05 LAB — COMPREHENSIVE METABOLIC PANEL
ALT: 35 U/L (ref 0–44)
AST: 93 U/L — ABNORMAL HIGH (ref 15–41)
Albumin: 3.5 g/dL (ref 3.5–5.0)
Alkaline Phosphatase: 107 U/L (ref 38–126)
Anion gap: 15 (ref 5–15)
BUN: 14 mg/dL (ref 6–20)
CO2: 20 mmol/L — ABNORMAL LOW (ref 22–32)
Calcium: 8.8 mg/dL — ABNORMAL LOW (ref 8.9–10.3)
Chloride: 98 mmol/L (ref 98–111)
Creatinine, Ser: 0.91 mg/dL (ref 0.61–1.24)
GFR, Estimated: >60 mL/min (ref >60)
Glucose, Bld: 84 mg/dL (ref 70–99)
Potassium: 3.5 mmol/L (ref 3.5–5.1)
Sodium: 133 mmol/L — ABNORMAL LOW (ref 135–145)
Total Bilirubin: 1.1 mg/dL (ref 0.3–1.2)
Total Protein: 8.2 g/dL — ABNORMAL HIGH (ref 6.5–8.1)

## 2021-09-05 LAB — RAPID URINE DRUG SCREEN, HOSP PERFORMED
Amphetamines: NOT DETECTED
Barbiturates: NOT DETECTED
Benzodiazepines: NOT DETECTED
Cocaine: NOT DETECTED
Opiates: NOT DETECTED
Tetrahydrocannabinol: NOT DETECTED

## 2021-09-05 LAB — SODIUM: Sodium: 135 mmol/L (ref 135–145)

## 2021-09-05 LAB — ETHANOL: Alcohol, Ethyl (B): 206 mg/dL — ABNORMAL HIGH (ref <10)

## 2021-09-05 LAB — HIV ANTIBODY (ROUTINE TESTING W REFLEX): HIV Screen 4th Generation wRfx: NONREACTIVE

## 2021-09-05 LAB — CBG MONITORING, ED: Glucose-Capillary: 97 mg/dL (ref 70–99)

## 2021-09-05 MED ORDER — SENNOSIDES-DOCUSATE SODIUM 8.6-50 MG PO TABS
1.0000 | ORAL_TABLET | Freq: Two times a day (BID) | ORAL | Status: DC
  Administered 2021-09-07 – 2021-09-09 (×2): 1 via ORAL
  Filled 2021-09-05 (×2): qty 1

## 2021-09-05 MED ORDER — CHLORHEXIDINE GLUCONATE 0.12 % MT SOLN
15.0000 mL | Freq: Two times a day (BID) | OROMUCOSAL | Status: DC
  Administered 2021-09-06 – 2021-09-15 (×20): 15 mL via OROMUCOSAL
  Filled 2021-09-05 (×13): qty 15

## 2021-09-05 MED ORDER — SODIUM CHLORIDE 3 % IV SOLN
INTRAVENOUS | Status: DC
Start: 2021-09-05 — End: 2021-09-09
  Filled 2021-09-05 (×13): qty 500

## 2021-09-05 MED ORDER — PANTOPRAZOLE SODIUM 40 MG IV SOLR
40.0000 mg | Freq: Every day | INTRAVENOUS | Status: DC
  Administered 2021-09-05 – 2021-09-07 (×3): 40 mg via INTRAVENOUS
  Filled 2021-09-05 (×3): qty 10

## 2021-09-05 MED ORDER — ACETAMINOPHEN 650 MG RE SUPP
650.0000 mg | RECTAL | Status: DC | PRN
Start: 2021-09-05 — End: 2021-10-01

## 2021-09-05 MED ORDER — ACETAMINOPHEN 160 MG/5ML PO SOLN
650.0000 mg | ORAL | Status: DC | PRN
  Administered 2021-09-09 – 2021-09-11 (×3): 650 mg
  Filled 2021-09-05 (×4): qty 20.3

## 2021-09-05 MED ORDER — AMLODIPINE BESYLATE 10 MG PO TABS
10.0000 mg | ORAL_TABLET | Freq: Every day | ORAL | Status: DC
  Administered 2021-09-07 – 2021-09-09 (×3): 10 mg via ORAL
  Filled 2021-09-05 (×3): qty 1

## 2021-09-05 MED ORDER — ACETAMINOPHEN 325 MG PO TABS
650.0000 mg | ORAL_TABLET | ORAL | Status: DC | PRN
Start: 2021-09-05 — End: 2021-10-01
  Administered 2021-09-12 – 2021-09-13 (×2): 650 mg via ORAL
  Filled 2021-09-05 (×2): qty 2

## 2021-09-05 MED ORDER — CHLORHEXIDINE GLUCONATE CLOTH 2 % EX PADS
6.0000 | MEDICATED_PAD | Freq: Every day | CUTANEOUS | Status: DC
  Administered 2021-09-05 – 2021-09-29 (×18): 6 via TOPICAL

## 2021-09-05 MED ORDER — STROKE: EARLY STAGES OF RECOVERY BOOK: Freq: Once | Status: AC

## 2021-09-05 MED ORDER — CLEVIDIPINE BUTYRATE 0.5 MG/ML IV EMUL
0.0000 mg/h | INTRAVENOUS | Status: DC
  Administered 2021-09-05 (×2): 2 mg/h via INTRAVENOUS
  Administered 2021-09-05: 4 mg/h via INTRAVENOUS
  Administered 2021-09-06: 10 mg/h via INTRAVENOUS
  Administered 2021-09-06: 15:00:00 6 mg/h via INTRAVENOUS
  Administered 2021-09-06 (×2): 8 mg/h via INTRAVENOUS
  Administered 2021-09-06: 6 mg/h via INTRAVENOUS
  Administered 2021-09-06 (×2): 7 mg/h via INTRAVENOUS
  Administered 2021-09-07: 10:00:00 12 mg/h via INTRAVENOUS
  Administered 2021-09-07: 18:00:00 10 mg/h via INTRAVENOUS
  Administered 2021-09-07: 6 mg/h via INTRAVENOUS
  Administered 2021-09-07 (×2): 12 mg/h via INTRAVENOUS
  Administered 2021-09-07: 15 mg/h via INTRAVENOUS
  Administered 2021-09-07 (×2): 10 mg/h via INTRAVENOUS
  Administered 2021-09-08: 12 mg/h via INTRAVENOUS
  Administered 2021-09-08: 10 mg/h via INTRAVENOUS
  Administered 2021-09-08: 11:00:00 14 mg/h via INTRAVENOUS
  Administered 2021-09-08: 17:00:00 8 mg/h via INTRAVENOUS
  Administered 2021-09-08 (×2): 15 mg/h via INTRAVENOUS
  Administered 2021-09-09: 8 mg/h via INTRAVENOUS
  Administered 2021-09-09: 6 mg/h via INTRAVENOUS
  Administered 2021-09-09: 14 mg/h via INTRAVENOUS
  Filled 2021-09-05 (×3): qty 100
  Filled 2021-09-05: qty 50
  Filled 2021-09-05: qty 100
  Filled 2021-09-05 (×2): qty 50
  Filled 2021-09-05 (×2): qty 100
  Filled 2021-09-05 (×2): qty 50
  Filled 2021-09-05: qty 200
  Filled 2021-09-05: qty 100
  Filled 2021-09-05 (×2): qty 50
  Filled 2021-09-05 (×2): qty 100
  Filled 2021-09-05: qty 50
  Filled 2021-09-05: qty 100
  Filled 2021-09-05: qty 50
  Filled 2021-09-05 (×2): qty 100
  Filled 2021-09-05 (×2): qty 50

## 2021-09-05 MED ORDER — LABETALOL HCL 5 MG/ML IV SOLN
10.0000 mg | Freq: Once | INTRAVENOUS | Status: AC
  Administered 2021-09-05: 10 mg via INTRAVENOUS

## 2021-09-05 MED ORDER — ORAL CARE MOUTH RINSE
15.0000 mL | Freq: Two times a day (BID) | OROMUCOSAL | Status: DC
  Administered 2021-09-06 – 2021-09-16 (×21): 15 mL via OROMUCOSAL

## 2021-09-05 MED ORDER — CLEVIDIPINE BUTYRATE 0.5 MG/ML IV EMUL
INTRAVENOUS | Status: AC
Start: 2021-09-05 — End: 2021-09-06
  Filled 2021-09-05: qty 50

## 2021-09-05 NOTE — Code Documentation (Signed)
Stroke Response Nurse Documentation Code Documentation  Decory Freni is a 61 y.o. male arriving to [facility] ED via [facility] on 09/05/21 with past medical hx of HTN,. On No antithrombotic. Code stroke was activated by EMS (EMS, ED).   Patient from home where he was LKW at 1645 and now complaining of incoherant speech, fall, and difficulty getting up.   Stroke team at the bedside on patient arrival. Labs drawn and patient cleared for CT by Dr. Roslynn Amble. Patient to CT with team. NIHSS 17, see documentation for details and code stroke times. Patient with  bilateral arm weakness, bilateral leg weakness, left decreased sensation, Global aphasia , and dysarthria  on exam. The following imaging was completed:  CT (CT, CTA head and neck, CTP). Patient is not a candidate for IV Thrombolytic due to bleed seen on CT.  BP less than 140 SBP   Bedside handoff with ED RN Threasa Beards.    [facility]  Stroke Response RN

## 2021-09-05 NOTE — ED Notes (Signed)
Called [facility] to activate code stroke  

## 2021-09-05 NOTE — ED Notes (Signed)
ICU given report and agreed to transfer pt in CT

## 2021-09-05 NOTE — ED Notes (Signed)
Code stroke to be activated. LSN 1645, aphasia, AMS, fall

## 2021-09-05 NOTE — ED Triage Notes (Addendum)
PER EMS: Pt arrived from home after a fall. + ETOH. He called his son after he fell. EMS arrived and found him laying supine on the floor. C-collar due ETOH, mumbles incoherent  BP-218/134, HR-76, RR-18, 96% RA, CBG-103

## 2021-09-05 NOTE — ED Notes (Signed)
Pts mother reports she was talking on the phone with the patient around 4:45pm when she heard what sounded like him falling down. He was talking coherently at that time. He told her he fell and to call his son. EMS arrived and found the patient laying down, couldn't get up and speech incoherently.  Dr. Reita May called to bedside

## 2021-09-05 NOTE — ED Provider Notes (Signed)
[facility] EMERGENCY DEPARTMENT Provider Note   CSN: [id number] Arrival date & time: 09/05/21  1735  An emergency department physician performed an initial assessment on this suspected stroke patient at 1819.  History  Chief Complaint  Patient presents with   Joshua Price is a 61 y.o. male.  History limited due to acuity, altered mental status.  History primarily obtained from mother at bedside as well as his children at bedside.  Mother reports that she was talking to him around 445 this afternoon and he seemed to be fine.  May have drank a [place] bit of alcohol but by no means was drunk or intoxicated at the time.  She was not with him at the time but family found him on the ground on his side confused, not speaking.  States he has very poor dentition at baseline and was told he needs all of his teeth removed.  HPI     Home Medications Prior to Admission medications   Medication Sig Start Date End Date Taking? Authorizing Provider  amLODipine (NORVASC) 5 MG tablet Take 1 tablet (5 mg total) by mouth daily. Patient taking differently: Take 10 mg by mouth daily.  02/24/15   Ambrose Finland, NP  aspirin EC 81 MG tablet Take 81 mg by mouth daily.    [provider]      Allergies    Lisinopril    Review of Systems   Review of Systems  Unable to perform ROS: Acuity of condition   Physical Exam Updated Vital Signs BP 124/76    Pulse 77    Temp 98.2 F (36.8 C) (Oral)    Resp 11    Ht 5\' 6"  (1.676 m)    Wt 65.8 kg    SpO2 95%    BMI 23.40 kg/m  Physical Exam Vitals and nursing note reviewed.  Constitutional:      Appearance: He is well-developed.     Comments: Confused, somewhat lethargic  HENT:     Head: Normocephalic.     Mouth/Throat:     Comments: Front left tooth is slightly loose and slightly angulated, no active bleeding Eyes:     Conjunctiva/sclera: Conjunctivae normal.  Neck:     Comments: C-collar  intact Cardiovascular:     Rate and Rhythm: Normal rate and regular rhythm.     Heart sounds: No murmur heard. Pulmonary:     Effort: Pulmonary effort is normal. No respiratory distress.     Breath sounds: Normal breath sounds.  Abdominal:     Palpations: Abdomen is soft.     Tenderness: There is no abdominal tenderness.  Musculoskeletal:        General: No swelling or deformity.     Comments: On the right upper extremity there is a tiny area of ecchymosis to the right shoulder though there is no deformity  Skin:    General: Skin is warm and dry.     Capillary Refill: Capillary refill takes less than 2 seconds.  Neurological:     Comments: Confused, somewhat lethargic, mumbles incomprehensible words, eyes open spontaneously, withdraws to pain in all 4 extremities  Psychiatric:        Mood and Affect: Mood normal.    ED Results / Procedures / Treatments   Labs (all labs ordered are listed, but only abnormal results are displayed) Labs Reviewed  COMPREHENSIVE METABOLIC PANEL - Abnormal; Notable for the following components:      Result Value  Sodium 133 (*)    CO2 20 (*)    Calcium 8.8 (*)    Total Protein 8.2 (*)    AST 93 (*)    All other components within normal limits  ETHANOL - Abnormal; Notable for the following components:   Alcohol, Ethyl (B) 206 (*)    All other components within normal limits  URINALYSIS, ROUTINE W REFLEX MICROSCOPIC - Abnormal; Notable for the following components:   Color, Urine STRAW (*)    Hgb urine dipstick MODERATE (*)    Bacteria, UA RARE (*)    All other components within normal limits  CBC - Abnormal; Notable for the following components:   RBC 3.98 (*)    Hemoglobin 12.9 (*)    HCT 38.0 (*)    Platelets 112 (*)    All other components within normal limits  I-STAT CHEM 8, ED - Abnormal; Notable for the following components:   Sodium 132 (*)    Calcium, Ion 1.03 (*)    All other components within normal limits  RESP PANEL BY RT-PCR  (FLU A&B, COVID) ARPGX2  PROTIME-INR  DIFFERENTIAL  RAPID URINE DRUG SCREEN, HOSP PERFORMED  CBG MONITORING, ED    EKG EKG Interpretation  Date/Time:  Saturday September 05 2021 17:48:07 EST Ventricular Rate:  77 PR Interval:  161 QRS Duration: 93 QT Interval:  405 QTC Calculation: 459 R Axis:   22 Text Interpretation: Sinus rhythm Consider left atrial enlargement Borderline T wave abnormalities Confirmed by Marianna Fussykstra, Sevrin Sally ([id number]) on 09/05/2021 5:57:32 PM  Radiology CT Cervical Spine Wo Contrast  Result Date: 09/05/2021 CLINICAL DATA:  Neck trauma, dangerous injury mechanism (Age 61-64y) EXAM: CT CERVICAL SPINE WITHOUT CONTRAST TECHNIQUE: Multidetector CT imaging of the cervical spine was performed without intravenous contrast. Multiplanar CT image reconstructions were also generated. RADIATION DOSE REDUCTION: This exam was performed according to the departmental dose-optimization program which includes automated exposure control, adjustment of the mA and/or kV according to patient size and/or use of iterative reconstruction technique. COMPARISON:  None. FINDINGS: Alignment: No subluxation Skull base and vertebrae: No acute fracture. No primary bone lesion or focal pathologic process. Soft tissues and spinal canal: No prevertebral fluid or swelling. No visible canal hematoma. Disc levels: Disc spaces are maintained. Moderate to advanced degenerative facet disease, right greater than left, most pronounced in the upper cervical spine. 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 No acute bony abnormality. Electronically Signed   By: Charlett NoseKevin  Dover M.D.   On: 09/05/2021 19:02   CT HEAD CODE STROKE WO CONTRAST  Result Date: 09/05/2021 CLINICAL DATA:  Code stroke.  Neuro deficit, acute, stroke suspected EXAM: CT HEAD WITHOUT CONTRAST TECHNIQUE: Contiguous axial images were obtained from the base of the skull through the vertex without  intravenous contrast. RADIATION DOSE REDUCTION: This exam was performed according to the departmental dose-optimization program which includes automated exposure control, adjustment of the mA and/or kV according to patient size and/or use of iterative reconstruction technique. COMPARISON:  None. FINDINGS: Brain: Approximately 4.6 x 2.3 x 3.3 cm parenchymal hemorrhage involving the left lentiform nucleus and surrounding white matter. Mild associated edema. There is mild regional mass effect including partial effacement the left lateral ventricle. No significant midline shift. No hydrocephalus. Gray-white differentiation is preserved. No extra-axial collection. Vascular: There is atherosclerotic calcification at the skull base. Skull: Calvarium is unremarkable. Sinuses/Orbits: No acute finding. Other: None. IMPRESSION: Acute parenchymal hemorrhage involving left lentiform nucleus and surrounding white matter. Mild  mass effect. These results were communicated to Dr. Otelia Limes at 6:30 pm on 09/05/2021 by text page via the [facility] messaging system. Electronically Signed   By: Guadlupe Spanish M.D.   On: 09/05/2021 18:31   CT Maxillofacial Wo Contrast  Result Date: 09/05/2021 CLINICAL DATA:  Facial trauma, blunt EXAM: CT MAXILLOFACIAL WITHOUT CONTRAST TECHNIQUE: Multidetector CT imaging of the maxillofacial structures was performed. Multiplanar CT image reconstructions were also generated. RADIATION DOSE REDUCTION: This exam was performed according to the departmental dose-optimization program which includes automated exposure control, adjustment of the mA and/or kV according to patient size and/or use of iterative reconstruction technique. COMPARISON:  None. FINDINGS: Osseous: No acute facial fracture. Degenerative changes at the temporomandibular joints. Multifocal tooth decay with periapical lucencies. Orbits: No intraorbital hematoma. Sinuses: Aerated. Soft tissues: Unremarkable. Limited intracranial: Dictated  separately. IMPRESSION: No acute facial fracture. Electronically Signed   By: Guadlupe Spanish M.D.   On: 09/05/2021 19:06    Procedures .Critical Care Performed by: Milagros Loll, MD Authorized by: Milagros Loll, MD   Critical care provider statement:    Critical care time (minutes):  46   Critical care was necessary to treat or prevent imminent or life-threatening deterioration of the following conditions:  CNS failure or compromise   Critical care was time spent personally by me on the following activities:  Development of treatment plan with patient or surrogate, discussions with consultants, evaluation of patient's response to treatment, examination of patient, ordering and review of laboratory studies, ordering and review of radiographic studies, ordering and performing treatments and interventions, pulse oximetry, re-evaluation of patient's condition and review of old charts    Medications Ordered in ED Medications  clevidipine (CLEVIPREX) infusion 0.5 mg/mL (4 mg/hr Intravenous Rate/Dose Change 09/05/21 1930)  labetalol (NORMODYNE) injection 10 mg (10 mg Intravenous Given 09/05/21 1831)    ED Course/ Medical Decision Making/ A&P                           Medical Decision Making Amount and/or Complexity of Data Reviewed Labs: ordered. Radiology: ordered.  Risk Prescription drug management. Decision regarding hospitalization.   61 year old male presents to ER via EMS with initial concern for fall and alcohol intoxication.  Upon obtaining additional history from family, patient was not significantly intoxicated.  Patient is lethargic, mumbling incomprehensible words.  Concern for stroke and initiated stroke alert.  Patient was found to have left intraparenchymal hemorrhage.  Independently reviewed CT imaging myself.  Dr. Otelia Limes will admit.  Patient was given dose of labetalol and started on Cleviprex for BP control.  Feel patient is able to protect airway and does not  require intubation at this time.  Will be closely monitored in ICU under neuro service.  From trauma standpoint, noted front left tooth seem to be slightly angulated and loose.  Mother at bedside states that he has very poor dentition and has been told he needs his teeth removed.  The somewhat loose tooth does not appear to be in good health.  CT max face negative.  Advised outpatient dentistry follow-up.  Had slight bruise to the right shoulder but no other traumatic finding identified on extremity assessment.  Reviewed medication list, no anticoagulation.  Additional history obtained from chart review, discussion with family including children at bedside and mother.        Final Clinical Impression(s) / ED Diagnoses Final diagnoses:  Left-sided nontraumatic intracerebral hemorrhage, unspecified cerebral location [facility])  Altered mental  status, unspecified altered mental status type    Rx / DC Orders ED Discharge Orders     None         Milagros Loll, MD 09/05/21 [id number]

## 2021-09-05 NOTE — Consult Note (Signed)
NAMETason Price, MRN:  [id number], DOB:  10/04/1960, LOS: 0 ADMISSION DATE:  09/05/2021, CONSULTATION DATE: September 05, 2021 REFERRING MD: Neuro service, CHIEF COMPLAINT: Drowsiness and right-sided weakness  History of Present Illness:  Patient 60 year old African-American male with past medical history of hypertension pheresis smoking who presented after he was found down and able to to get up patient was brought by EMS to the ED where he was hypertensive blood pressure 218/130 he was code stroke which showed left basal ganglia Hemorrhage we were consulted for possible decompensation of airway.  Objective   Blood pressure 128/83, pulse 70, temperature 98.8 F (37.1 C), temperature source Oral, resp. rate 10, height 5\' 6"  (1.676 m), weight 65.8 kg, SpO2 96 %.       No intake or output data in the 24 hours ending 09/05/21 2218 Filed Weights   09/05/21 1751  Weight: 65.8 kg    Assessment and plan  --Intracranial hemorrhage hypotension related.  That is being managed by neuro blood pressure control patient is on Cleviprex    --Compromised airway patient GCS is 11-12 able to protect airway blood gas reassuring satting 96% on 2 to 4 L no secretions in the airway he is able to handle secretions so far at this point we will monitor with the neuro service if the patient condition declines will reexplore the possibility of intubation.  --Pending MRI tonight.  Examination: General: Seem to be waking up to verbal command GCS 12 respond to some questions Neuro: Lethargic to some extent but very easily arousable with right-sided weakness able to answer some questions HEENT:  atraumatic , no jaundice , dry mucous membranes  Cardiovascular:  Irregular irregular , ESM 2/6 in the aortic area  Lungs:  CTA bilateral , no wheezing or crackles  Abdomen:  Soft lax +BS , no tenderness . Musculoskeletal:  WNL , normal pulses  Skin:  No rash    Labs   CBC: Recent Labs  Lab 09/05/21 1820  09/05/21 1904 09/05/21 2140  WBC 4.3  --   --   NEUTROABS 1.7  --   --   HGB 12.9* 15.0 14.3  HCT 38.0* 44.0 42.0  MCV 95.5  --   --   PLT 112*  --   --     Basic Metabolic Panel: Recent Labs  Lab 09/05/21 1755 09/05/21 1904 09/05/21 2140  NA 133* 132* 136  K 3.5 4.1 3.8  CL 98 98  --   CO2 20*  --   --   GLUCOSE 84 83  --   BUN 14 18  --   CREATININE 0.91 1.10  --   CALCIUM 8.8*  --   --    GFR: Estimated Creatinine Clearance: 64.4 mL/min (by C-G formula based on SCr of 1.1 mg/dL). Recent Labs  Lab 09/05/21 1820  WBC 4.3    Liver Function Tests: Recent Labs  Lab 09/05/21 1755  AST 93*  ALT 35  ALKPHOS 107  BILITOT 1.1  PROT 8.2*  ALBUMIN 3.5   No results for input(s): LIPASE, AMYLASE in the last 168 hours. No results for input(s): AMMONIA in the last 168 hours.  ABG    Component Value Date/Time   PHART 7.336 (L) 09/05/2021 2140   PCO2ART 41.0 09/05/2021 2140   PO2ART 76 (L) 09/05/2021 2140   HCO3 21.9 09/05/2021 2140   TCO2 23 09/05/2021 2140   ACIDBASEDEF 4.0 (H) 09/05/2021 2140   O2SAT 94 09/05/2021 2140  Coagulation Profile: Recent Labs  Lab 09/05/21 1820  INR 1.1    Cardiac Enzymes: No results for input(s): CKTOTAL, CKMB, CKMBINDEX, TROPONINI in the last 168 hours.  HbA1C: No results found for: HGBA1C  CBG: Recent Labs  Lab 09/05/21 1814  GLUCAP 97    Review of Systems:   Unable to obtain due to patient condition  Past Medical History:  He,  has a past medical history of Esophageal varices ([place]).   Surgical History:  History reviewed. No pertinent surgical history.   Social History:   reports that he has been smoking cigarettes. He has been smoking an average of .25 packs per day. He has never used smokeless tobacco. He reports current alcohol use. He reports that he does not use drugs.   Family History:  His family history includes Cancer in his sister; Hypertension in his mother and sister.   Allergies Allergies   Allergen Reactions   Lisinopril Swelling     Home Medications  Prior to Admission medications   Medication Sig Start Date End Date Taking? Authorizing Provider  amLODipine (NORVASC) 5 MG tablet Take 1 tablet (5 mg total) by mouth daily. Patient not taking: Reported on 09/05/2021 02/24/15   Lance Bosch, NP

## 2021-09-05 NOTE — H&P (Addendum)
Admission H&P    Chief Complaint: Acute onset of right sided weakness  HPI: Joshua Price is an 61 y.o. male with a history of HTN, smoking and esophageal varices presenting to the ED via EMS as a Code Stroke after he was found down and unable to get up on the concrete porch of his home. He had been speaking to a relative over the phone when she heard some chaotic sounding noise immediately after which he said he was on the ground and could not get up. Family member noted this to have occurred at 4:45 PM. She called the patient's son who arrived about 5 minutes later to see his father on his right side on the hard concrete unable to get up. He called EMS and they placed the patient in a c-collar before bringing him to the ED. EMS noted right sided weakness and incoherent speech. Vitals per EMS: BP-218/134, HR-76, RR-18, 96% RA, CBG-103.  STAT CT head in the ED revealed an acute left basal ganglia hemorrhage.   Past Medical History:  Diagnosis Date   Esophageal varices (HCC)   HTN  History reviewed. No pertinent surgical history.  Family History  Problem Relation Age of Onset   Cancer Sister    Hypertension Sister    Hypertension Mother    Social History:  reports that he has been smoking cigarettes. He has been smoking an average of .25 packs per day. He has never used smokeless tobacco. He reports current alcohol use. He reports that he does not use drugs.  Allergies:  Allergies  Allergen Reactions   Lisinopril Swelling    Home Medications: ASA Amlodipine  ROS: In the context of decreased verbal output, the patient does not have any additional complaints.    Physical Examination: Blood pressure 127/84, pulse 80, temperature 98.2 F (36.8 C), temperature source Oral, resp. rate 17, height 5\' 6"  (1.676 m), weight 65.8 kg, SpO2 94 %.  HEENT-  Mouth trauma noted with a loose left maxillary incisor. No evidence of trauma to scalp. C-collar in place.  Lungs - Respirations  unlabored Extremities - No edema. Bruise to right shoulder noted.   Neurologic Examination: Mental Status: Awake with decreased level of alertness. Answers to his name. Right hemineglect. Speech is sparse and dysfluent as well as mildly dysarthric. Able to follow simple left-sided commands. Poor orientation. Poor situational awareness.  Cranial Nerves: II:  PERRL 3 >> 2 mm bilaterally. Attends more to left sided than right sided peripheral visual stimuli.  III,IV, VI: No ptosis. Left sided gaze preference but can track to the right.  V: Reacts to touch bilaterally VII: Decreased NL fold on the right.  VIII: Hearing intact to some commands IX,X: Deferred testing of gag reflex XI: Head preferentially rotated to the left.  XII: Did not protrude tongue to command.  Motor: LUE and LLE 5/5 RUE 1-2/5 with decreased tone RLE withdraws from noxious with minimal flexion of ankle, knee and hip to noxious plantar stimulation, rated as 1-2/5 Sensory: Decreased reactivity to right sided stimuli Deep Tendon Reflexes: No definite asymmetry. Toes mute bilaterally Cerebellar: No gross ataxia on the left. Unable to perform on the right due to weakness Gait: Unable to assess  Results for orders placed or performed during the hospital encounter of 09/05/21 (from the past 48 hour(s))  CBG monitoring, ED     Status: None   Collection Time: 09/05/21  6:14 PM  Result Value Ref Range   Glucose-Capillary 97 70 - 99 mg/dL  Comment: Glucose reference range applies only to samples taken after fasting for at least 8 hours.   CT HEAD CODE STROKE WO CONTRAST  Result Date: 09/05/2021 CLINICAL DATA:  Code stroke.  Neuro deficit, acute, stroke suspected EXAM: CT HEAD WITHOUT CONTRAST TECHNIQUE: Contiguous axial images were obtained from the base of the skull through the vertex without intravenous contrast. RADIATION DOSE REDUCTION: This exam was performed according to the departmental dose-optimization program which  includes automated exposure control, adjustment of the mA and/or kV according to patient size and/or use of iterative reconstruction technique. COMPARISON:  None. FINDINGS: Brain: Approximately 4.6 x 2.3 x 3.3 cm parenchymal hemorrhage involving the left lentiform nucleus and surrounding white matter. Mild associated edema. There is mild regional mass effect including partial effacement the left lateral ventricle. No significant midline shift. No hydrocephalus. Gray-white differentiation is preserved. No extra-axial collection. Vascular: There is atherosclerotic calcification at the skull base. Skull: Calvarium is unremarkable. Sinuses/Orbits: No acute finding. Other: None. IMPRESSION: Acute parenchymal hemorrhage involving left lentiform nucleus and surrounding white matter. Mild mass effect. These results were communicated to Dr. Otelia Limes at 6:30 pm on 09/05/2021 by text page via the [facility] messaging system. Electronically Signed   By: Guadlupe Spanish M.D.   On: 09/05/2021 18:31     Assessment: 61 year old male presenting with acute right sided weakness 1. CT head reveals acute parenchymal hemorrhage involving left lentiform nucleus and surrounding white matter. Mild mass effect. 2. Exam reveals findings on the right referable to the acute left basal ganglia hemorrhage 3. ICH risk factors: Smoking and HTN 4. Most likely etiology for the ICH is HTN 5. Regular drinker at home. At risk for withdrawal. Will need CIWA protocol.    Plan: 1. Admit to ICU under Neurology service 2. MRI/MRA of head 3. Carotid ultrasound 4. TTE 5. PT consult, OT consult, Speech consult 6. Cardiac telemetry 7. Frequent neuro checks 8. Stop ASA.  9. Repeat CT head in 6 hours (1 AM) 10. BP management with clevidipine drip  11. No antiplatelet medications or anticoagulants. DVT prophylaxis with SCDs 12. CT maxillofacial is with report pending. If significant trauma, may need to call Surgery.  13. CT cervical spine with no  acute fracture or instability. C-collar removed.   14. CIWA protocol. EtOH level. UTOX.   Addendum: - Repeat exam at 7:50 PM reveals increased right sided neglect and decreased level of alertness with decreased ability to arouse with tactile and light noxious stimuli. Pupils are symmetric and unchanged.  - STAT repeat CT head ordered - Hypertonic saline 3% at 50 cc/hr - Discussed with Dr. Wilford Corner in sign out.  - Calling CCM for STAT consult in case patient deteriorates and is no longer able to protect his airway.  - EtOH level elevated at 206. UTOX negative.   50 minutes spent in the emergent neurological evaluation and management of this critically ill patient.    Electronically signed: Dr. Caryl Pina 09/05/2021, 6:57 PM

## 2021-09-06 ENCOUNTER — Inpatient Hospital Stay (HOSPITAL_COMMUNITY): Payer: Medicaid Other

## 2021-09-06 ENCOUNTER — Encounter (HOSPITAL_COMMUNITY): Payer: Self-pay | Admitting: Neurology

## 2021-09-06 DIAGNOSIS — I6389 Other cerebral infarction: Secondary | ICD-10-CM

## 2021-09-06 LAB — SODIUM
Sodium: 138 mmol/L (ref 135–145)
Sodium: 140 mmol/L (ref 135–145)
Sodium: 143 mmol/L (ref 135–145)

## 2021-09-06 LAB — ECHOCARDIOGRAM COMPLETE
AR max vel: 3.57 cm2
AV Area VTI: 3.26 cm2
AV Area mean vel: 3.27 cm2
AV Mean grad: 6 mmHg
AV Peak grad: 10.6 mmHg
Ao pk vel: 1.63 m/s
Area-P 1/2: 4.21 cm2
Calc EF: 67.8 %
Height: 66 in
MV VTI: 3.58 cm2
Single Plane A2C EF: 73.3 %
Single Plane A4C EF: 61 %
Weight: 2320 oz

## 2021-09-06 LAB — MRSA NEXT GEN BY PCR, NASAL: MRSA by PCR Next Gen: NOT DETECTED

## 2021-09-06 MED ORDER — LORAZEPAM 2 MG/ML IJ SOLN
1.0000 mg | INTRAMUSCULAR | Status: DC | PRN
  Administered 2021-09-06 – 2021-09-09 (×4): 1 mg via INTRAVENOUS
  Filled 2021-09-06 (×4): qty 1

## 2021-09-06 NOTE — Progress Notes (Signed)
Pt's mother, cousin, and son updated at bedside on pt's diagnosis and plan of care.

## 2021-09-06 NOTE — Progress Notes (Signed)
PT Cancellation Note  Patient Details Name: Joshua Price MRN: [id number] DOB: [birth year]   Cancelled Treatment:    Reason Eval/Treat Not Completed: Active bedrest order. Pt on bedrest until 7PM per orders. PT will plan on following up Monday unless bedrest orders are removed earlier.   Zenaida Niece 09/06/2021, 7:43 AM

## 2021-09-06 NOTE — Progress Notes (Addendum)
eLink Physician-Brief Progress Note Patient Name: Wendell Nicoson DOB: 11-Jul-1961 MRN: [id number]   Date of Service  09/06/2021  HPI/Events of Note  ICH 60/M with history of hypertension, who presents with weakness. HE ws gound down and unable to get back up. Initial evaluation was significant for hypertension (210s/130s). Subsequent CT head revealed L basal ganglia hemorrhage. He was started on a cleviprex drip and admitted to the ICU.    eICU Interventions  Intracranial hemorrhage - Probably secondary to uncontrolled hypertension - Started on cleviprex drip, target SBP <140 - Will restart home antihypertensives in AM (if without contraindication) and wean off cleviprex drip.  - Neurochecks as per protocol. - Plan for STAT CT head if with acute clinical deterioration. Otherwise, planned for MRI tonight.  - No coagulopathy to correct based on labs - NPO for now until passes swallow evaluation - Glucose control.  - Will continue to monitor ability to protect airway - maintain low threshold to intubate for airway protection if needed.   2. DVT prophylaxis - SCDs        Kiasia Chou M DELA CRUZ 09/06/2021, 12:48 AM

## 2021-09-06 NOTE — Progress Notes (Signed)
°  Transition of Care (TOC) Screening Note   Patient Details  Name: Joshua Price Date of Birth: 16-Feb-1961   Transition of Care [facility]) CM/SW Contact:    Windle Guard, LCSW Phone Number: 09/06/2021, 8:08 AM    Transition of Care Department [facility]) has reviewed patient and noted no immediate TOC needs pending continued medical work-up. TOC team will continue to monitor patient advancement through interdisciplinary progression rounds to support any identified discharge supports as needed. If new patient transition needs arise, please place a TOC consult or reach out to [facility] team.

## 2021-09-06 NOTE — Evaluation (Signed)
Speech Language Pathology Evaluation Patient Details Name: Joshua Price MRN: [id number] DOB: 08/19/1960 Today's Date: 09/06/2021 Time: 3244-0102 SLP Time Calculation (min) (ACUTE ONLY): 15 min  Problem List:  Patient Active Problem List   Diagnosis Date Noted   ICH (intracerebral hemorrhage) (HCC) 09/05/2021   Altered mental status    Hypertensive emergency    Essential hypertension, benign 08/22/2013   Smoking 08/22/2013   Dental cavities 08/22/2013   Past Medical History:  Past Medical History:  Diagnosis Date   Esophageal varices (HCC)    Past Surgical History: History reviewed. No pertinent surgical history. HPI:  Patient is a 61 y.o. male with PMH: HTN, tobacco use, esophageal varices who presented to ED via EMS on 2/25 as a code stroke after being found down and unable to get up on concrete porch of his home. EMS noted right sided weakness and incoherent speech. In ED, stat CT revealed acute left basal ganglia hemorrhage. on 2/25, MRI head revealed appearance of increasing size of left basal ganglia intraparenchymal hematoma and slightly increasing mass effect on left lateral ventricle and 41mm of left to right midline shift. Patient failed [facility] swallow screen with nursing and has been NPO awaiting SLP swallow evaluation.   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 Patient was lethargic but easily made alert with tactile and verbal cues. He did respond to his name and was able to state his name after significant delay. He occasionally verbalized however not producing real words/meaningful speech. He opened mouth to command and although he was not able to protrude tongue on command, he did start to move it in mouth slightly. Patient intermittently restless in bed and had one  instance of RR increasing briefly to mid-40's. SLP will continue to follow patient for cognitive-linguistic and speech treatment with plans for ongoing assessment of his abilities as he becomes more alert and attentive.    SLP Assessment  SLP Recommendation/Assessment: Patient needs continued Speech Lanaguage Pathology Services SLP Visit Diagnosis: Cognitive communication deficit (R41.841);Dysarthria and anarthria (R47.1);Aphasia (R47.01)    Recommendations for follow up therapy are one component of a multi-disciplinary discharge planning process, led by the attending physician.  Recommendations may be updated based on patient status, additional functional criteria and insurance authorization.    Follow Up Recommendations  Other (comment) (pending progress: SNF vs AIR)    Assistance Recommended at Discharge  Frequent or constant Supervision/Assistance  Functional Status Assessment Patient has had a recent decline in their functional status and demonstrates the ability to make significant improvements in function in a reasonable and predictable amount of time.  Frequency and Duration min 2x/week  2 weeks      SLP Evaluation Cognition  Overall Cognitive Status: No family/caregiver present to determine baseline cognitive functioning Arousal/Alertness: Lethargic Orientation Level: Disoriented X4;Other (comment) (responds to first name) Attention: Focused Focused Attention: Impaired Focused Attention Impairment: Verbal basic;Functional basic Memory:  (unable to assess secondary to lethargy and patient inability to effectively communicate verbally or non-verbally) Problem Solving: Impaired Problem Solving Impairment: Functional basic Behaviors: Restless       Comprehension  Auditory Comprehension Overall Auditory Comprehension: Impaired Yes/No Questions: Impaired Basic Biographical Questions: 0-25% accurate Commands: Impaired One Step Basic Commands: 0-24% accurate Interfering  Components: Attention;Processing speed EffectiveTechniques: Extra processing time;Visual/Gestural cues Visual Recognition/Discrimination Discrimination: Not tested Reading Comprehension Reading Status: Not tested  Expression Expression Primary Mode of Expression: Verbal Verbal Expression Overall Verbal Expression: Impaired Initiation: Impaired Automatic Speech: Name Level of Generative/Spontaneous Verbalization: Word Naming: Not tested Pragmatics: Unable to assess Interfering Components: Attention Non-Verbal Means of Communication: Not applicable Written Expression Written Expression: Not tested   Oral / Motor  Oral Motor/Sensory Function Overall Oral Motor/Sensory Function: Moderate impairment Facial ROM: Reduced right;Suspected CN VII (facial) dysfunction Facial Symmetry: Abnormal symmetry right;Suspected CN VII (facial) dysfunction Facial Strength: Reduced right Lingual Strength: Reduced Motor Speech Overall Motor Speech: Impaired Respiration: Within functional limits Phonation: Other (comment) (difficult to fully assess given limited sample but appears [facility]) Resonance: Within functional limits Articulation: Impaired Level of Impairment: Word Intelligibility: Intelligibility reduced Word: 25-49% accurate Phrase: 0-24% accurate Sentence: Not tested Conversation: Not tested Motor Planning: Impaired Level of Impairment: Phrase            Angela Nevin, MA, CCC-SLP Speech Therapy

## 2021-09-06 NOTE — Progress Notes (Addendum)
STROKE TEAM PROGRESS NOTE   INTERVAL HISTORY Laying in bed, slightly agitated. CIWA protocol initiated. Increase NS 3% 55ml/hr.  Tremor, tachycardic, not following commands  Vitals:   09/06/21 0645 09/06/21 0700 09/06/21 0715 09/06/21 0800  BP: 131/77 (!) 146/72 (!) 148/82   Pulse: 76 75 85   Resp: 19 14 15    Temp:    98.3 F (36.8 C)  TempSrc:    Oral  SpO2: 95% 94% 95%   Weight:      Height:       CBC:  Recent Labs  Lab 09/05/21 1820 09/05/21 1904 09/05/21 2140  WBC 4.3  --   --   NEUTROABS 1.7  --   --   HGB 12.9* 15.0 14.3  HCT 38.0* 44.0 42.0  MCV 95.5  --   --   PLT 112*  --   --    Basic Metabolic Panel:  Recent Labs  Lab 09/05/21 1755 09/05/21 1904 09/05/21 2140 09/05/21 2143 09/06/21 0339  NA 133* 132* 136 135 138  K 3.5 4.1 3.8  --   --   CL 98 98  --   --   --   CO2 20*  --   --   --   --   GLUCOSE 84 83  --   --   --   BUN 14 18  --   --   --   CREATININE 0.91 1.10  --   --   --   CALCIUM 8.8*  --   --   --   --    Lipid Panel: No results for input(s): CHOL, TRIG, HDL, CHOLHDL, VLDL, LDLCALC in the last 168 hours. HgbA1c: No results for input(s): HGBA1C in the last 168 hours. Urine Drug Screen:  Recent Labs  Lab 09/05/21 1820  LABOPIA NONE DETECTED  COCAINSCRNUR NONE DETECTED  LABBENZ NONE DETECTED  AMPHETMU NONE DETECTED  THCU NONE DETECTED  LABBARB NONE DETECTED    Alcohol Level  Recent Labs  Lab 09/05/21 1755  ETH 206*    IMAGING past 24 hours CT HEAD WO CONTRAST  Result Date: 09/06/2021 CLINICAL DATA:  Hemorrhagic stroke, follow-up EXAM: CT HEAD WITHOUT CONTRAST TECHNIQUE: Contiguous axial images were obtained from the base of the skull through the vertex without intravenous contrast. RADIATION DOSE REDUCTION: This exam was performed according to the departmental dose-optimization program which includes automated exposure control, adjustment of the mA and/or kV according to patient size and/or use of iterative reconstruction  technique. COMPARISON:  CT 09/05/2021 FINDINGS: Brain: Intraparenchymal hematoma centered in the left basal ganglia now measuring up to 6.5 x 2.8 x 4.4 cm (AP x TR x CC) (series 3, image 14 and series 5, image 37), previously 5.2 x 3.5 x 2.4 cm when remeasured similarly. Increased surrounding hypodensity, consistent with worsening edema. Slightly increased mass effect on the left lateral ventricle, with unchanged size of the left temporal horn. 4 mm of left-to-right midline shift at the level of the septum pellucidum, previously 2 mm when remeasured similarly. The basal cisterns are preserved. No hydrocephalus or extra-axial collection. No acute cortical infarct. Vascular: No hyperdense vessel or unexpected calcification. Skull: Normal. Negative for fracture or focal lesion. Sinuses/Orbits: No acute finding. Other: None. IMPRESSION: Interval increase in the size of the intraparenchymal hematoma centered in the left basal ganglia, with increased associated edema and 4 mm of left-to-right midline shift. Slight interval increased mass effect on the left lateral ventricle, without evidence of entrapment of the left temporal  horn. Electronically Signed   By: Merilyn Baba M.D.   On: 09/06/2021 02:36   CT HEAD WO CONTRAST (5MM)  Result Date: 09/05/2021 CLINICAL DATA:  Hemorrhagic stroke, follow-up examination EXAM: CT HEAD WITHOUT CONTRAST TECHNIQUE: Contiguous axial images were obtained from the base of the skull through the vertex without intravenous contrast. RADIATION DOSE REDUCTION: This exam was performed according to the departmental dose-optimization program which includes automated exposure control, adjustment of the mA and/or kV according to patient size and/or use of iterative reconstruction technique. COMPARISON:  6:25 p.m. FINDINGS: Brain: Intraparenchymal hematoma within the left basal ganglia appears stable measuring 5.2 x 3.5 x 2.4 cm on axial image # 15/3 and coronal image # 37/5. Mild surrounding  cytotoxic edema persists and there is mild effacement of the left lateral ventricle and left sylvian fissure, stable since prior examination. No midline shift. No interval hemorrhage. Ventricular size is normal. Cerebellum is unremarkable. Vascular: No hyperdense vessel or unexpected calcification. Skull: Normal. Negative for fracture or focal lesion. Sinuses/Orbits: Orbits are unremarkable. Paranasal sinuses are clear. Other: Mastoid air cells and middle ear cavities are clear. IMPRESSION: Stable intraparenchymal hematoma within the left basal ganglia with mild surrounding mass effect. No interval hemorrhage. Electronically Signed   By: Fidela Salisbury M.D.   On: 09/05/2021 20:32   CT Cervical Spine Wo Contrast  Result Date: 09/05/2021 CLINICAL DATA:  Neck trauma, dangerous injury mechanism (Age 53-64y) EXAM: CT CERVICAL SPINE WITHOUT CONTRAST TECHNIQUE: Multidetector CT imaging of the cervical spine was performed without intravenous contrast. Multiplanar CT image reconstructions were also generated. RADIATION DOSE REDUCTION: This exam was performed according to the departmental dose-optimization program which includes automated exposure control, adjustment of the mA and/or kV according to patient size and/or use of iterative reconstruction technique. COMPARISON:  None. FINDINGS: Alignment: No subluxation Skull base and vertebrae: No acute fracture. No primary bone lesion or focal pathologic process. Soft tissues and spinal canal: No prevertebral fluid or swelling. No visible canal hematoma. Disc levels: Disc spaces are maintained. Moderate to advanced degenerative facet disease, right greater than left, most pronounced in the upper cervical spine. 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 No acute bony abnormality. Electronically Signed   By: Rolm Baptise M.D.   On: 09/05/2021 19:02   MR BRAIN WO CONTRAST  Result Date: 09/06/2021 CLINICAL DATA:   Hemorrhagic stroke EXAM: MRI HEAD WITHOUT CONTRAST TECHNIQUE: Multiplanar, multiecho pulse sequences of the brain and surrounding structures were obtained without intravenous contrast. COMPARISON:  No prior MRI, correlation is made with 09/05/2021 8:26 p.m. head CT FINDINGS: Brain: Redemonstrated intraparenchymal hematoma centered in the left basal ganglia, which measures approximately 6.4 x 3.2 x 4.3 cm (AP x TR x CC) (series 10, image 13 and series 17, image 16), previously 5.1 x 3.5 x 2.4 cm when measured similarly. Surrounding increased T2 hyperintense signal, likely edema. Rim of peripheral restricted diffusion, likely infarcted tissue along margin; no distant areas of acute or subacute infarct. Slight interval increase in mass effect on the left lateral ventricle, without enlargement of the temporal horn to suggest entrapment. 4 mm of left-to-right midline shift at the level of the septum pellucidum., previously 2 mm at this level when remeasured similarly. The basal cisterns are preserved. Hemorrhage is noted in the occipital horn of the right lateral ventricle and likely within the atrium of the left lateral ventricle. An additional small area of hemosiderin deposition is noted in the right medial cerebellum, likely sequela  prior hypertensive microhemorrhage. Vascular: Normal flow voids. Skull and upper cervical spine: Normal marrow signal. Sinuses/Orbits: Negative. Other: Fluid in the right mastoid tip. IMPRESSION: Redemonstrated intraparenchymal hematoma centered in the left basal ganglia, which appears to have increased in size compared to the study from 08/20 6 p.m. on 09/05/2021. There is slightly increasing mass effect on the left lateral ventricle and 4 mm of left-to-right midline shift. Electronically Signed   By: Wiliam Ke M.D.   On: 09/06/2021 02:31   DG Chest Portable 1 View  Result Date: 09/05/2021 CLINICAL DATA:  right shoulder pain chest pain after fall EXAM: PORTABLE CHEST 1 VIEW  COMPARISON:  12/06/2008 FINDINGS: The heart size and mediastinal contours are within normal limits. Both lungs are clear. The visualized skeletal structures are unremarkable. IMPRESSION: No active disease. Electronically Signed   By: Charlett Nose M.D.   On: 09/05/2021 19:46   DG Shoulder Right Portable  Result Date: 09/05/2021 CLINICAL DATA:  Right shoulder pain, chest pain after fall EXAM: RIGHT SHOULDER - 1 VIEW COMPARISON:  None. FINDINGS: Degenerative changes in the [facility] joint with joint space narrowing and spurring. Glenohumeral joint is maintained. No acute bony abnormality. Specifically, no fracture, subluxation, or dislocation. Soft tissues are intact. IMPRESSION: Degenerative changes in the right AC joint. No acute bony abnormality. Electronically Signed   By: Charlett Nose M.D.   On: 09/05/2021 19:46   CT HEAD CODE STROKE WO CONTRAST  Result Date: 09/05/2021 CLINICAL DATA:  Code stroke.  Neuro deficit, acute, stroke suspected EXAM: CT HEAD WITHOUT CONTRAST TECHNIQUE: Contiguous axial images were obtained from the base of the skull through the vertex without intravenous contrast. RADIATION DOSE REDUCTION: This exam was performed according to the departmental dose-optimization program which includes automated exposure control, adjustment of the mA and/or kV according to patient size and/or use of iterative reconstruction technique. COMPARISON:  None. FINDINGS: Brain: Approximately 4.6 x 2.3 x 3.3 cm parenchymal hemorrhage involving the left lentiform nucleus and surrounding white matter. Mild associated edema. There is mild regional mass effect including partial effacement the left lateral ventricle. No significant midline shift. No hydrocephalus. Gray-white differentiation is preserved. No extra-axial collection. Vascular: There is atherosclerotic calcification at the skull base. Skull: Calvarium is unremarkable. Sinuses/Orbits: No acute finding. Other: None. IMPRESSION: Acute parenchymal hemorrhage  involving left lentiform nucleus and surrounding white matter. Mild mass effect. These results were communicated to Dr. Otelia Limes at 6:30 pm on 09/05/2021 by text page via the [facility] messaging system. Electronically Signed   By: Guadlupe Spanish M.D.   On: 09/05/2021 18:31   CT Maxillofacial Wo Contrast  Result Date: 09/05/2021 CLINICAL DATA:  Facial trauma, blunt EXAM: CT MAXILLOFACIAL WITHOUT CONTRAST TECHNIQUE: Multidetector CT imaging of the maxillofacial structures was performed. Multiplanar CT image reconstructions were also generated. RADIATION DOSE REDUCTION: This exam was performed according to the departmental dose-optimization program which includes automated exposure control, adjustment of the mA and/or kV according to patient size and/or use of iterative reconstruction technique. COMPARISON:  None. FINDINGS: Osseous: No acute facial fracture. Degenerative changes at the temporomandibular joints. Multifocal tooth decay with periapical lucencies. Orbits: No intraorbital hematoma. Sinuses: Aerated. Soft tissues: Unremarkable. Limited intracranial: Dictated separately. IMPRESSION: No acute facial fracture. Electronically Signed   By: Guadlupe Spanish M.D.   On: 09/05/2021 19:06    PHYSICAL EXAM  Physical Exam  Constitutional: Appears well-developed and well-nourished.  Cardiovascular: Normal rate and regular rhythm.  Respiratory: Effort normal, non-labored breathing  Neuro: Mental Status: Patient is awake, alert, did  not speak during exam.  Poor situational awareness Following some one step central commands such as open eyes and open mouth with some delay. Cranial Nerves: II: blink to threat b/l. Pupils are equal, round, and reactive to light.   III,IV, VI: EOMI without ptosis or diploplia.   VII: Facial movement is symmetric resting and smiling VIII: Hearing is intact to voice X: Palate elevates symmetrically XI: Head is midline XII: Does not protrude tongue Motor: Tone is normal. Bulk  is normal.  LUE, LLE 4/5 RUE, RLE 1/5  Right hemiplegia, Left antigravity Sensory: Sensation is symmetric to light touch and temperature in the arms and legs. No extinction to DSS present.  Cerebellar: Tremor noted bilaterally   ASSESSMENT/PLAN [name] is a 61 y.o. male with history of HTN, smoking, esophageal varcies presenting after he was found down on the concrete porch of his home.  He has been on the phone speaking with a relative and she heard him fall and he was able to state that he was on the ground and could not get up family member stated that this happened at 1645 on 2/25 and the son was able to get there within 5 minutes, he then called EMS.  On arrival he had decreased alertness, right hemineglect, dysarthric speech.  Hypertonic saline initiated for cerebral edema with mass effect. Hypertonic saline increased to 60ml/hr, most recent sodium 140. Repeat head CT ordered for later today.   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 Mild mass effect. Repeat CT- Interval increase in the size of the intraparenchymal hematoma centered in the left basal ganglia, with increased associated edema and 4 mm of left-to-right midline shift. MRI  Redemonstrated intraparenchymal hematoma centered in the left basal ganglia, which appears to have increased in size compared to the study  6 p.m. on 09/05/2021. There is slightly increasing mass effect on the left lateral ventricle and 4 mm of left-to-right midline shift. 2D Echo EF [id number], grade 1 diastolic dysfunction VTE prophylaxis -SCDs No antithrombotic prior to admission, now on No antithrombotic. IPH Therapy recommendations:  Pending Disposition:  Pending  Cerebral edema with brain compression increased mass effect on left lateral ventricle Hypertonic saline increased to  6ml/hr Na- 138-> 140  Hypertension Home meds:  Norvasc 5mg , increased Norvasc 10mg  Stable Permissive hypertension (OK if < 220/120) but gradually normalize in 5-7 days Long-term BP goal normotensive  Other Stroke Risk Factors ETOH use, alcohol level 206, advised to drink no more than 2 drink(s) a day Cigarette smoker, advised to quit smoking .25ppd  Other Active Problems Alcohol withdrawal - CIWA protocol initiated  Ativan 1-2mg  prn  Hospital day # 1  Patient seen and examined by NP/APP with MD. MD to update note as needed.   Janine Ores, DNP, FNP-BC [facility] Pager: [phone]  ATTENDING ATTESTATION:  61 year old gentleman history hypertension smoking, alcohol use presented to the ED he was found down on the concrete porch.  Found to have left basal ganglia hemorrhage on CT with right-sided weakness.  EtOH level is 206.  U tox negative.  Repeat CT showed worsening edema and started on hypertonic saline with no bolus.  On examination he can follow one-step commands no speech output.  He appears to be tremulous in the upper extremities consistent with alcohol withdrawal.  Right hemiparesis.  Sodium this morning 138 hypertonic saline increase 75 cc an hour and  then sodium was 140 later in the day.  Blood pressure is being controlled with Cleviprex drip.  He is currently n.p.o. after speech evaluation.  Appreciate CCM, Dr. Michelle Piper assistance.  Overnight MRI shows 4 mm shift.  On CIWA protocol with Ativan for his agitation.  Dr. Reeves Forth evaluated pt independently, reviewed imaging, chart, labs. Discussed and formulated plan with the APP. Please see APP note above for details.      This patient is critically ill due to respiratory distress, intracranial hemorrhage on hypertonic saline and at significant risk of neurological worsening, death form heart failure, respiratory failure, recurrent stroke, bleeding from [facility], seizure, sepsis. This patient's care requires  constant monitoring of vital signs, hemodynamics, respiratory and cardiac monitoring, review of multiple databases, neurological assessment, discussion with family, other specialists and medical decision making of high complexity. I spent 35 minutes of neurocritical care time in the care of this patient.   Derry Kassel,MD    To contact Stroke Continuity provider, please refer to [email or web address] After hours, contact General Neurology

## 2021-09-06 NOTE — Progress Notes (Signed)
OT Cancellation Note  Patient Details Name: Joshua Price MRN: [id number] DOB: April 10, 1961   Cancelled Treatment:    Reason Eval/Treat Not Completed: Active bedrest order (Will return as schedule allows.)  Genora Arp M Viren Lebeau Klayten Jolliff MSOT, OTR/L Acute Rehab Pager: [phone] Office: [phone] 09/06/2021, 7:09 AM

## 2021-09-06 NOTE — Evaluation (Signed)
Clinical/Bedside Swallow Evaluation Patient Details  Name: Joshua Price MRN: [id number] Date of Birth: Nov 02, 1960  Today's Date: 09/06/2021 Time: SLP Start Time (ACUTE ONLY): 0920 SLP Stop Time (ACUTE ONLY): 0935 SLP Time Calculation (min) (ACUTE ONLY): 15 min  Past Medical History:  Past Medical History:  Diagnosis Date   Esophageal varices (HCC)    Past Surgical History: History reviewed. No pertinent surgical history. HPI:  Patient is a 61 y.o. male with PMH: HTN, tobacco use, esophageal varices who presented to ED via EMS on 2/25 as a code stroke after being found down and unable to get up on concrete porch of his home. EMS noted right sided weakness and incoherent speech. In ED, stat CT revealed acute left basal ganglia hemorrhage. on 2/25, MRI head revealed appearance of increasing size of left basal ganglia intraparenchymal hematoma and slightly increasing mass effect on left lateral ventricle and 10mm of left to right midline shift. Patient failed [facility] swallow screen with nursing and has been NPO awaiting SLP swallow evaluation.    Assessment / Plan / Recommendation  Clinical Impression  Patient presents with clinical s/s of dysphagia as per this clinical/bedside swallow evaluation. He was lethargic and although responded to verbal cues to alert without difficulty, he was not able to maintain alertness for prolonged amount of time. Patient did vocalize and verbalize a very limited amount and although voice appeared clear and strong, difficult to fully assess given a small sample. Patient unable to stick out tongue when SLP assessing oral motor musculature but did open mouth with verbal and tactile cues. Weakness and facial assymetry noted on right side. Patient did respond adequately to small ice chips and was able to demonstrate some mastication (light and inefficient). Swallow initiation was very delayed with ice chips and with spoon sips of honey thick liquids. Although patient did not  exhibit any cough response or throat clearing, with his limited vocalizations, lethargy and location of his stroke, he is at high risk for silent aspiration. SLP is recommending to continue NPO status and plan for f/u next date to determine if patient ready for objective swallow study  (MBS). Anticipate patient will need short term alternative nutrition. SLP Visit Diagnosis: Dysphagia, unspecified (R13.10)    Aspiration Risk  Moderate aspiration risk;Severe aspiration risk;Risk for inadequate nutrition/hydration    Diet Recommendation NPO   Medication Administration: Via alternative means    Other  Recommendations Oral Care Recommendations: Oral care QID;Staff/trained caregiver to provide oral care    Recommendations for follow up therapy are one component of a multi-disciplinary discharge planning process, led by the attending physician.  Recommendations may be updated based on patient status, additional functional criteria and insurance authorization.  Follow up Recommendations Other (comment) (pending progress: SNF vs AIR)      Assistance Recommended at Discharge Frequent or constant Supervision/Assistance  Functional Status Assessment Patient has had a recent decline in their functional status and demonstrates the ability to make significant improvements in function in a reasonable and predictable amount of time.  Frequency and Duration min 2x/week  2 weeks       Prognosis Prognosis for Safe Diet Advancement: Good      Swallow Study   General Date of Onset: 09/05/21 HPI: Patient is a 61 y.o. male with PMH: HTN, tobacco use, esophageal varices who presented to ED via EMS on 2/25 as a code stroke after being found down and unable to get up on concrete porch of his home. EMS noted right sided weakness and  incoherent speech. In ED, stat CT revealed acute left basal ganglia hemorrhage. on 2/25, MRI head revealed appearance of increasing size of left basal ganglia intraparenchymal  hematoma and slightly increasing mass effect on left lateral ventricle and 51mm of left to right midline shift. Patient failed [facility] swallow screen with nursing and has been NPO awaiting SLP swallow evaluation. Type of Study: Bedside Swallow Evaluation Previous Swallow Assessment: none found Diet Prior to this Study: NPO Temperature Spikes Noted: No Respiratory Status: Room air History of Recent Intubation: No Behavior/Cognition: Requires cueing;Lethargic/Drowsy Oral Cavity Assessment: Other (comment) (oral mucosa appears moist but with dried, small amount of blood on teeth, gumline, lips. RN reported this is apparently a chronic issue with patient. Overall dentition is poor in quality and with several missing teeth) Oral Care Completed by SLP: Yes Oral Cavity - Dentition: Poor condition;Missing dentition Self-Feeding Abilities: Total assist Patient Positioning: Upright in bed Baseline Vocal Quality: Other (comment) (mild amount of vocalizations/verbalizations observed) Volitional Cough: Cognitively unable to elicit Volitional Swallow: Unable to elicit    Oral/Motor/Sensory Function Overall Oral Motor/Sensory Function: Moderate impairment Facial ROM: Reduced right;Suspected CN VII (facial) dysfunction Facial Symmetry: Abnormal symmetry right;Suspected CN VII (facial) dysfunction Facial Strength: Reduced right Lingual Strength: Reduced   Ice Chips Ice chips: Impaired Oral Phase Impairments: Poor awareness of bolus Oral Phase Functional Implications: Prolonged oral transit;Oral holding Pharyngeal Phase Impairments: Suspected delayed Swallow   Thin Liquid      Nectar Thick     Honey Thick Honey Thick Liquid: Impaired Presentation: Spoon Oral Phase Impairments: Poor awareness of bolus;Reduced lingual movement/coordination Oral Phase Functional Implications: Prolonged oral transit Pharyngeal Phase Impairments: Suspected delayed Swallow   Puree Puree: Not tested   Solid     Solid: Not  tested      Angela Nevin, MA, CCC-SLP Speech Therapy

## 2021-09-06 NOTE — Consult Note (Signed)
° °  NAMEJamey Price, MRN:  [id number], DOB:  [birth year], LOS: 1 ADMISSION DATE:  09/05/2021, CONSULTATION DATE: September 05, 2021 REFERRING MD: Neuro service, CHIEF COMPLAINT: Drowsiness and right-sided weakness  History of Present Illness:  Patient 61 year old African-American male with past medical history of hypertension pheresis smoking who presented after he was found down and able to to get up patient was brought by EMS to the ED where he was hypertensive blood pressure 218/130 he was code stroke which showed left basal ganglia Hemorrhage we were consulted for possible decompensation of airway.  Objective   Blood pressure (!) 141/82, pulse 75, temperature 98.6 F (37 C), temperature source Oral, resp. rate 17, height 5\' 6"  (1.676 m), weight 65.8 kg, SpO2 94 %.        Intake/Output Summary (Last 24 hours) at 09/06/2021 1509 Last data filed at 09/06/2021 1400 Gross per 24 hour  Intake 1147.94 ml  Output 2100 ml  Net -952.06 ml   Filed Weights   09/05/21 1751  Weight: 65.8 kg    Examination: General: Seem to be waking up to verbal command GCS 12 respond to some questions Neuro: Lethargic to some extent but very easily arousable with right-sided weakness able to answer some questions HEENT:  atraumatic , no jaundice , dry mucous membranes  Cardiovascular:  Irregular irregular , ESM 2/6 in the aortic area  Lungs:  CTA bilateral , no wheezing or crackles  Abdomen:  Soft lax +BS , no tenderness . Musculoskeletal:  WNL , normal pulses  Skin:  No rash    Assessment and plan:    Critically ill due to hypertensive emergency with left basal ganglia hemorrhage requiring titration of cleviprex.  At high risk for cerebral compression requiring hyperosmolar therapy and at high risk for airway compromise.   Plan:   -Target SBP 130-150 - Cortrak tomorrow and start enteral blood pressure agents.  - Appears to be protecting airway for now.  - Continue hypertonic saline for goal  150-155.   Best Practice (right click and "Reselect all SmartList Selections" daily)   Diet/type: NPO DVT prophylaxis: SCD GI prophylaxis: N/A Lines: N/A Foley:  N/A Code Status:  full code Last date of multidisciplinary goals of care discussion [pending]   09/07/21, MD [facility] ICU Physician [facility]  Pager: [phone] Or Epic Secure Chat After hours: [phone].  09/06/2021, 3:15 PM

## 2021-09-07 ENCOUNTER — Inpatient Hospital Stay (HOSPITAL_COMMUNITY): Payer: Medicaid Other

## 2021-09-07 ENCOUNTER — Inpatient Hospital Stay: Payer: Self-pay

## 2021-09-07 LAB — SODIUM
Sodium: 146 mmol/L — ABNORMAL HIGH (ref 135–145)
Sodium: 149 mmol/L — ABNORMAL HIGH (ref 135–145)
Sodium: 147 mmol/L — ABNORMAL HIGH (ref 135–145)
Sodium: 150 mmol/L — ABNORMAL HIGH (ref 135–145)

## 2021-09-07 LAB — PHOSPHORUS: Phosphorus: 2.4 mg/dL — ABNORMAL LOW (ref 2.5–4.6)

## 2021-09-07 LAB — LDL CHOLESTEROL, DIRECT: Direct LDL: 94.3 mg/dL (ref 0–99)

## 2021-09-07 LAB — CBC
HCT: 38.8 % — ABNORMAL LOW (ref 39.0–52.0)
Hemoglobin: 13.5 g/dL (ref 13.0–17.0)
MCH: 33.8 pg (ref 26.0–34.0)
MCHC: 34.8 g/dL (ref 30.0–36.0)
MCV: 97.2 fL (ref 80.0–100.0)
Platelets: 121 10*3/uL — ABNORMAL LOW (ref 150–400)
RBC: 3.99 MIL/uL — ABNORMAL LOW (ref 4.22–5.81)
RDW: 13.8 % (ref 11.5–15.5)
WBC: 5 10*3/uL (ref 4.0–10.5)
nRBC: 0 % (ref 0.0–0.2)

## 2021-09-07 LAB — LIPID PANEL
Cholesterol: 255 mg/dL — ABNORMAL HIGH (ref 0–200)
HDL: 34 mg/dL — ABNORMAL LOW (ref >40)
LDL Cholesterol: UNDETERMINED mg/dL (ref 0–99)
Total CHOL/HDL Ratio: 7.5 RATIO
Triglycerides: 678 mg/dL — ABNORMAL HIGH (ref <150)
VLDL: UNDETERMINED mg/dL (ref 0–40)

## 2021-09-07 LAB — HEMOGLOBIN A1C
Hgb A1c MFr Bld: 5.3 % (ref 4.8–5.6)
Mean Plasma Glucose: 105.41 mg/dL

## 2021-09-07 LAB — BASIC METABOLIC PANEL
Anion gap: 9 (ref 5–15)
BUN: 6 mg/dL (ref 6–20)
CO2: 26 mmol/L (ref 22–32)
Calcium: 8.8 mg/dL — ABNORMAL LOW (ref 8.9–10.3)
Chloride: 110 mmol/L (ref 98–111)
Creatinine, Ser: 0.87 mg/dL (ref 0.61–1.24)
GFR, Estimated: >60 mL/min (ref >60)
Glucose, Bld: 142 mg/dL — ABNORMAL HIGH (ref 70–99)
Potassium: 3.3 mmol/L — ABNORMAL LOW (ref 3.5–5.1)
Sodium: 145 mmol/L (ref 135–145)

## 2021-09-07 LAB — MAGNESIUM: Magnesium: 2.2 mg/dL (ref 1.7–2.4)

## 2021-09-07 MED ORDER — METOPROLOL TARTRATE 25 MG PO TABS
25.0000 mg | ORAL_TABLET | Freq: Two times a day (BID) | ORAL | Status: DC
  Administered 2021-09-07 – 2021-09-09 (×3): 25 mg via ORAL
  Filled 2021-09-07 (×3): qty 1

## 2021-09-07 MED ORDER — LABETALOL HCL 5 MG/ML IV SOLN
10.0000 mg | INTRAVENOUS | Status: DC | PRN
  Administered 2021-09-09 – 2021-09-11 (×3): 10 mg via INTRAVENOUS
  Filled 2021-09-07 (×4): qty 4

## 2021-09-07 MED ORDER — POTASSIUM & SODIUM PHOSPHATES 280-160-250 MG PO PACK
1.0000 | PACK | Freq: Three times a day (TID) | ORAL | Status: DC
  Administered 2021-09-07: 1 via ORAL
  Filled 2021-09-07: qty 1

## 2021-09-07 MED ORDER — POTASSIUM CHLORIDE 10 MEQ/100ML IV SOLN
10.0000 meq | INTRAVENOUS | Status: AC
  Administered 2021-09-07 (×4): 10 meq via INTRAVENOUS
  Filled 2021-09-07 (×4): qty 100

## 2021-09-07 NOTE — Evaluation (Signed)
Occupational Therapy Evaluation Patient Details Name: Joshua Price MRN: [id number] DOB: Dec 09, 1960 Today's Date: 09/07/2021   History of Present Illness Patient 61 year old African-American male presented after he was found down. He had hypertensive blood pressure 218/130, CT: left basal ganglia hemorrhage. PHMx:  hypertension, smoking, ETOH abuse, and esophageal varices.   Clinical Impression   This 61 yo male admitted with above presents to acute OT with PLOF of being totally independent with basic ADLs based off of chart review. Currently he is total A for all basic ADLs and total A-Max A +2 for bed mobility and sit<>stand. He tends to keep his eyes closed >95% of time, in sitting he pushes to the right (currently with left lateral lean and sit<>stand not helping with this). He will continue to benefit from acute OT with follow up on AIR (dependent on A he has available at home), otherwise may need SNF.     Recommendations for follow up therapy are one component of a multi-disciplinary discharge planning process, led by the attending physician.  Recommendations may be updated based on patient status, additional functional criteria and insurance authorization.   Follow Up Recommendations  Acute inpatient rehab (3hours/day)    Assistance Recommended at Discharge Frequent or constant Supervision/Assistance  Patient can return home with the following Two people to help with walking and/or transfers;Two people to help with bathing/dressing/bathroom;Direct supervision/assist for medications management;Direct supervision/assist for financial management;Assistance with cooking/housework;Assist for transportation;Help with stairs or ramp for entrance;Assistance with feeding    Functional Status Assessment  Patient has had a recent decline in their functional status and demonstrates the ability to make significant improvements in function in a reasonable and predictable amount of time.  Equipment  Recommendations  Other (comment) (TBD next venue)       Precautions / Restrictions Precautions Precautions: Fall Precaution Comments: "pusher" to right at EOB Restrictions Weight Bearing Restrictions: No      Mobility Bed Mobility Overal bed mobility: Needs Assistance Bed Mobility: Rolling, Supine to Sit, Sit to Supine Rolling: Total assist (roll to left)   Supine to sit: Total assist, +2 for physical assistance, HOB elevated Sit to supine: +2 for physical assistance, Total assist   General bed mobility comments: Pt has been trying to get up on left hand side of bed, but when we went in to work with him and asked him to sit up on left hand side of bed and gestured as well, he did not attempt to help [place] sit him up.    Transfers Overall transfer level: Needs assistance   Transfers: Sit to/from Stand Sit to Stand: Max assist, +2 physical assistance           General transfer comment: at EOB x2      Balance Overall balance assessment: Needs assistance Sitting-balance support: Single extremity supported, Feet supported Sitting balance-Leahy Scale: Zero Sitting balance - Comments: In sitting EOB, pt pushing and leaning right. Had him come down on his left forearm to see if this would help with his pushing but it did not   Standing balance support: Bilateral upper extremity supported Standing balance-Leahy Scale: Zero Standing balance comment: Max +2 sit<>stand from bed x2 (this did not help with his pushing when he sat back down)                           ADL either performed or assessed with clinical judgement   ADL  General ADL Comments: total A     Vision Baseline Vision/History: 1 Wears glasses (per his EPIC picture) Additional Comments: tends to keep eyes closed, when A'd to open eyes right eye is not moving equally with right eye            Pertinent Vitals/Pain Pain Assessment Pain  Assessment: Faces Faces Pain Scale: No hurt        Extremity/Trunk Assessment Upper Extremity Assessment Upper Extremity Assessment: RUE deficits/detail RUE Deficits / Details: flaccid--PROM WNL with a [place] mild tightness in digits but can easily get full PROM           Communication Communication Communication: Receptive difficulties;Expressive difficulties (says "no" at times but otherwise unable to understand what he says)   Cognition Arousal/Alertness: Awake/alert Behavior During Therapy: Impulsive, Restless Overall Cognitive Status: No family/caregiver present to determine baseline cognitive functioning Area of Impairment: Following commands, Safety/judgement, Awareness, Problem solving                       Following Commands: Follows one step commands inconsistently Safety/Judgement: Decreased awareness of safety, Decreased awareness of deficits Awareness: Intellectual Problem Solving: Slow processing, Decreased initiation, Difficulty sequencing, Requires verbal cues, Requires tactile cues                  Home Living                                   Additional Comments: Pt unable to state and no family available. Per chart was at home talking to his mother on phone when he fell and was able to tell her to call his son.              OT Problem List: Decreased strength;Decreased range of motion;Impaired balance (sitting and/or standing);Impaired vision/perception;Impaired UE functional use;Impaired tone;Decreased cognition;Decreased safety awareness      OT Treatment/Interventions: Self-care/ADL training;DME and/or AE instruction;Patient/family education;Balance training;Therapeutic exercise;Therapeutic activities;Visual/perceptual remediation/compensation    OT Goals(Current goals can be found in the care plan section) Acute Rehab OT Goals Patient Stated Goal: pt unable to state OT Goal Formulation: Patient unable to participate in  goal setting Time For Goal Achievement: 09/21/21 Potential to Achieve Goals: Good  OT Frequency: Min 2X/week    Co-evaluation PT/OT/SLP Co-Evaluation/Treatment: Yes Reason for Co-Treatment: For patient/therapist safety;Necessary to address cognition/behavior during functional activity PT goals addressed during session: Mobility/safety with mobility;Balance;Strengthening/ROM OT goals addressed during session: Strengthening/ROM      AM-PAC OT "6 Clicks" Daily Activity     Outcome Measure Help from another person eating meals?: Total (NPO) Help from another person taking care of personal grooming?: Total Help from another person toileting, which includes using toliet, bedpan, or urinal?: Total Help from another person bathing (including washing, rinsing, drying)?: Total Help from another person to put on and taking off regular upper body clothing?: Total Help from another person to put on and taking off regular lower body clothing?: Total 6 Click Score: 6   End of Session Nurse Communication: Mobility status  Activity Tolerance: Patient tolerated treatment well Patient left: in bed;with call bell/phone within reach;with bed alarm set  OT Visit Diagnosis: Unsteadiness on feet (R26.81);Other abnormalities of gait and mobility (R26.89);Muscle weakness (generalized) (M62.81);Low vision, both eyes (H54.2);Other symptoms and signs involving cognitive function;Hemiplegia and hemiparesis;Cognitive communication deficit (R41.841) Symptoms and signs involving cognitive functions: Nontraumatic intracerebral hemorrhage Hemiplegia - Right/Left: Right Hemiplegia - dominant/non-dominant:  (  unknown) Hemiplegia - caused by: Nontraumatic intracerebral hemorrhage                Time: 5284-1324 OT Time Calculation (min): 20 min Charges:  OT General Charges $OT Visit: 1 Visit OT Evaluation $OT Eval Moderate Complexity: 1 Mod  Ignacia Palma, OTR/L Acute [employer] Pager [phone] Office  [phone]    Evette Georges 09/07/2021, 11:44 AM

## 2021-09-07 NOTE — Progress Notes (Signed)
Inpatient Rehab Admissions Coordinator:  ° °Per therapy recommendations patient was screened for CIR candidacy by Jaymen Fetch, MS, CCC-SLP. At this time, Pt. Appears to be a a potential candidate for CIR. I will place   order for rehab consult per protocol for full assessment. Please contact me any with questions. ° °Joshua Gillison, MS, CCC-SLP °Rehab Admissions Coordinator  °[phone] (celll) °[phone] (office) ° °

## 2021-09-07 NOTE — TOC CAGE-AID Note (Signed)
Transition of Care [facility]) - CAGE-AID Screening   Patient Details  Name: Joshua Price MRN: [id number] Date of Birth: 1960-09-11  Transition of Care [facility] CM/SW Contact:    Ella Bodo, RN Phone Number: 09/07/2021, 10:03 AM   Clinical Narrative: Pt admitted with hypertensive emergency and Lt basal ganglia hemorrhage. Pt's mental status is altered at this time; unable to participate in SA screening.    CAGE-AID Screening: Substance Abuse Screening unable to be completed due to: : Patient unable to participate (altered mental status)     Reinaldo Raddle, RN, BSN  Trauma/Neuro ICU Case Manager [phone]

## 2021-09-07 NOTE — Progress Notes (Signed)
Spoke with Overton Mam, RN regarding consent. Unable to obtain at this time. RN stated she would reach out to the provider and see if he wants to place a medical necessity order for PICC placement. Currently the pt has 3 PIV's and the 3% NS is infusing at 46ml/hr.

## 2021-09-07 NOTE — Progress Notes (Signed)
Attempted to reach neuro MD Thomasena Edis on call in regards to patient's rising temperature.  2000 - 99.7 2300 - 100.7  Patient unable to take oral meds, and only has orders for oral tylenol. Will continue to call. RN will continue to monitor.  Harriett Sine, RN

## 2021-09-07 NOTE — Progress Notes (Signed)
Pt ate > 50% of his meal, will defer Cortrak placement at this time and monitor for continued adequate intake.   Per Dr. Lake Bells, place order if he is unable to maintain goal of at least 50% of meals.

## 2021-09-07 NOTE — Progress Notes (Signed)
° °  NAMEJayden Price, MRN:  [id number], DOB:  09-25-1960, LOS: 2 ADMISSION DATE:  09/05/2021, CONSULTATION DATE: September 05, 2021 REFERRING MD: Neuro service, CHIEF COMPLAINT: Drowsiness and right-sided weakness  History of Present Illness:  Patient 61 year old African-American male with past medical history of hypertension pheresis smoking who presented after he was found down and able to to get up patient was brought by EMS to the ED where he was hypertensive blood pressure 218/130 he was code stroke which showed left basal ganglia Hemorrhage we were consulted for possible decompensation of airway.  Objective   Blood pressure 137/90, pulse 90, temperature 98.4 F (36.9 C), temperature source Axillary, resp. rate (!) 23, height 5\' 6"  (1.676 m), weight 65.8 kg, SpO2 97 %.        Intake/Output Summary (Last 24 hours) at 09/07/2021 0709 Last data filed at 09/07/2021 0600 Gross per 24 hour  Intake 1748.66 ml  Output 3500 ml  Net -1751.34 ml    Filed Weights   09/05/21 1751  Weight: 65.8 kg    Examination: General: Acutely ill appearing in no distress Cardiac: RRR, no LE edema Pulm: breathing comfortably on room air, lung sounds clear GI: soft, non-distended Neuro: awakens to voice. Aphasic. Does not follow commands. PERRL. Right hemiplegia.  Skin: no rash or lesion  Assessment and plan:    Critically ill due to hypertensive emergency with left basal ganglia hemorrhage requiring titration of cleviprex. PCCM consulted for high risk of airway compromise.  No significant change in neurologic status.  Appears to be protecting his airway.  Na 146 this morning  - Continue cleviprex; Target SBP 130-150 - Titrate hypertonic saline for goal 150-155.  - PCCM will sign off. Please reach out if respiratory status declines.   Best Practice (right click and "Reselect all SmartList Selections" daily)   Diet/type: NPO DVT prophylaxis: SCD GI prophylaxis: N/A Lines: N/A Foley:   N/A Code Status:  full code Last date of multidisciplinary goals of care discussion [pending]  09/07/21, MD Internal Medicine Resident PGY-3 [facility] 09/07/2021 8:15 AM

## 2021-09-07 NOTE — Progress Notes (Signed)
This RN reached out to neuro MD on call Collins regarding PICC line consult/insertion for this patient. Vascular team unable to gain consent from family members, and patient unable to give consent due to disorientation. MD deferred medical necessity order for PICC line for time being, and hypertonic 3% fluids will not exceed 75/hr.   Harriett Sine, RN

## 2021-09-07 NOTE — Evaluation (Signed)
Physical Therapy Evaluation Patient Details Name: Joshua Price MRN: [id number] DOB: [birth year] Today's Date: 09/07/2021  History of Present Illness  61 yo male [facility] with fall at home, + ETOH, BP 218/134. CT head reveals acute parenchymal hemorrhage involving left lentiform nucleus and  surrounding white matter; repeat CT shows interval increase in side of intraparenchymal hematoma centered in L BG, increased edema and 93mm L-to-R midline shift. PMH includes HTN, smoker, esophageal varices.  Clinical Impression   Pt presents with R hemiparesis, max difficulty mobilizing, heavy pushing with LUE towards R, impaired activity tolerance. Pt to benefit from acute PT to address deficits. Pt requiring max/total +2 for bed mobility and transfer to stand, pt limited by pushing, significant R weakness, and limited command following. PT to progress mobility as tolerated, and will continue to follow acutely.         Recommendations for follow up therapy are one component of a multi-disciplinary discharge planning process, led by the attending physician.  Recommendations may be updated based on patient status, additional functional criteria and insurance authorization.  Follow Up Recommendations Acute inpatient rehab (3hours/day)    Assistance Recommended at Discharge Frequent or constant Supervision/Assistance  Patient can return home with the following  Two people to help with walking and/or transfers;Two people to help with bathing/dressing/bathroom;Direct supervision/assist for medications management;Direct supervision/assist for financial management;Assist for transportation;Help with stairs or ramp for entrance;Assistance with cooking/housework;Assistance with feeding    Equipment Recommendations Other (comment) (tbd)  Recommendations for Other Services       Functional Status Assessment Patient has had a recent decline in their functional status and demonstrates the ability to  make significant improvements in function in a reasonable and predictable amount of time.     Precautions / Restrictions Precautions Precautions: Fall Precaution Comments: "pusher" to right at EOB, SBP goal 130-150 Restrictions Weight Bearing Restrictions: No      Mobility  Bed Mobility Overal bed mobility: Needs Assistance Bed Mobility: Rolling, Supine to Sit, Sit to Supine Rolling: Total assist (roll to left)   Supine to sit: Total assist, +2 for physical assistance, HOB elevated Sit to supine: +2 for physical assistance, Total assist   General bed mobility comments: Pt has been trying to get up on left hand side of bed, but when we went in to work with him and asked him to sit up on left hand side of bed and gestured as well, he did not attempt to help [place] sit him up.    Transfers Overall transfer level: Needs assistance Equipment used: 2 person hand held assist Transfers: Sit to/from Stand Sit to Stand: Max assist, +2 physical assistance           General transfer comment: max +2 for power up, rise, posterior truncal support, maintaining stand, LLE blocking by OT. STS x2    Ambulation/Gait               General Gait Details: nt  Financial trader Rankin (Stroke Patients Only) Modified Rankin (Stroke Patients Only) Pre-Morbid Rankin Score: No significant disability Modified Rankin: Severe disability     Balance Overall balance assessment: Needs assistance Sitting-balance support: Single extremity supported, Feet supported Sitting balance-Leahy Scale: Zero Sitting balance - Comments: Heavy pushing towards R with LUE, placed on L elbow with max assist to prevent pushing throughout EOB sitting   Standing balance support: Bilateral upper extremity supported Standing  balance-Leahy Scale: Zero Standing balance comment: Max +2 sit<>stand from bed x2 (this did not help with his pushing when he sat back down)                              Pertinent Vitals/Pain Pain Assessment Pain Assessment: Faces Faces Pain Scale: No hurt Pain Intervention(s): Monitored during session    Home Living Family/patient expects to be discharged to:: Unsure                   Additional Comments: Pt unable to state and no family available    Prior Function                       Hand Dominance        Extremity/Trunk Assessment   Upper Extremity Assessment Upper Extremity Assessment: Defer to OT evaluation RUE Deficits / Details: flaccid--PROM WNL with a [place] mild tightness in digits but can easily get full PROM    Lower Extremity Assessment Lower Extremity Assessment: RLE deficits/detail RLE Deficits / Details: 0/5 throughout, + pain/tickle response only    Cervical / Trunk Assessment Cervical / Trunk Assessment: Normal  Communication   Communication: Receptive difficulties;Expressive difficulties (mouths and gestures no, vocalizes x1 at start of session to PT and RN stating "tired of this")  Cognition Arousal/Alertness: Awake/alert Behavior During Therapy: Impulsive, Restless Overall Cognitive Status: Difficult to assess Area of Impairment: Following commands, Safety/judgement, Awareness, Problem solving, Attention                   Current Attention Level: Focused   Following Commands: Follows one step commands inconsistently Safety/Judgement: Decreased awareness of safety, Decreased awareness of deficits Awareness: Intellectual Problem Solving: Slow processing, Decreased initiation, Difficulty sequencing, Requires verbal cues, Requires tactile cues General Comments: difficult to determine given aphasic changes. Pt inconsistently following mobility commands, more consistently follows commands when supine with limited distraction and on L side (no command following on R suspect secondary to inattention and weakness). Limited scanning to R, requires max cues to look R.         General Comments General comments (skin integrity, edema, etc.): HRmax 147 bpm during mobility, SBP 159 s/p mobility once in supine and RN aware    Exercises     Assessment/Plan    PT Assessment Patient needs continued PT services  PT Problem List Decreased strength;Decreased mobility;Decreased safety awareness;Decreased knowledge of precautions;Decreased coordination;Decreased activity tolerance;Decreased cognition;Cardiopulmonary status limiting activity;Decreased knowledge of use of DME;Decreased balance       PT Treatment Interventions DME instruction;Therapeutic activities;Gait training;Therapeutic exercise;Patient/family education;Balance training;Functional mobility training;Neuromuscular re-education    PT Goals (Current goals can be found in the Care Plan section)  Acute Rehab PT Goals PT Goal Formulation: Patient unable to participate in goal setting Time For Goal Achievement: 09/21/21 Potential to Achieve Goals: Good    Frequency Min 4X/week     Co-evaluation PT/OT/SLP Co-Evaluation/Treatment: Yes Reason for Co-Treatment: Necessary to address cognition/behavior during functional activity;Complexity of the patient's impairments (multi-system involvement);For patient/therapist safety;To address functional/ADL transfers PT goals addressed during session: Mobility/safety with mobility;Balance;Strengthening/ROM OT goals addressed during session: Strengthening/ROM       AM-PAC PT "6 Clicks" Mobility  Outcome Measure Help needed turning from your back to your side while in a flat bed without using bedrails?: A Lot Help needed moving from lying on your back to sitting on the side of a flat bed without  using bedrails?: Total Help needed moving to and from a bed to a chair (including a wheelchair)?: Total Help needed standing up from a chair using your arms (e.g., wheelchair or bedside chair)?: Total Help needed to walk in hospital room?: Total Help needed climbing 3-5  steps with a railing? : Total 6 Click Score: 7    End of Session   Activity Tolerance: Patient limited by fatigue Patient left: in bed;with call bell/phone within reach;with bed alarm set;with nursing/sitter in room Nurse Communication: Mobility status PT Visit Diagnosis: Other abnormalities of gait and mobility (R26.89);Muscle weakness (generalized) (M62.81);Hemiplegia and hemiparesis Hemiplegia - Right/Left: Right Hemiplegia - dominant/non-dominant: Dominant Hemiplegia - caused by: Nontraumatic intracerebral hemorrhage    Time: [id number] PT Time Calculation (min) (ACUTE ONLY): 19 min   Charges:   PT Evaluation $PT Eval Moderate Complexity: 1 Mod         Oriya Kettering S, PT DPT Acute Rehabilitation Services Pager [phone]  Office [phone]   Roosevelt E Ruffin Pyo 09/07/2021, 12:09 PM

## 2021-09-07 NOTE — Progress Notes (Addendum)
STROKE TEAM PROGRESS NOTE   INTERVAL HISTORY Patient is noted to be drowsy after ativan administration overnight.  HTS to continue at 75cc/hr, will obtain repeat head CT at 1600 today.  He has been hemodynamically stable with no acute events.  Vitals:   09/07/21 0915 09/07/21 0930 09/07/21 0945 09/07/21 1200  BP: (!) 149/87 (!) 176/91 137/86   Pulse: 95 (!) 118 94   Resp: 19 (!) 28 (!) 22   Temp:    99.4 F (37.4 C)  TempSrc:    Axillary  SpO2: 97% (!) 86% 96%   Weight:      Height:       CBC:  Recent Labs  Lab 09/05/21 1820 09/05/21 1904 09/05/21 2140 09/07/21 0751  WBC 4.3  --   --  5.0  NEUTROABS 1.7  --   --   --   HGB 12.9*   < > 14.3 13.5  HCT 38.0*   < > 42.0 38.8*  MCV 95.5  --   --  97.2  PLT 112*  --   --  121*   < > = values in this interval not displayed.    Basic Metabolic Panel:  Recent Labs  Lab 09/05/21 1755 09/05/21 1904 09/05/21 2140 09/05/21 2143 09/07/21 0100 09/07/21 0751  NA 133* 132* 136   < > 146* 145  K 3.5 4.1 3.8  --   --  3.3*  CL 98 98  --   --   --  110  CO2 20*  --   --   --   --  26  GLUCOSE 84 83  --   --   --  142*  BUN 14 18  --   --   --  6  CREATININE 0.91 1.10  --   --   --  0.87  CALCIUM 8.8*  --   --   --   --  8.8*  MG  --   --   --   --   --  2.2  PHOS  --   --   --   --   --  2.4*   < > = values in this interval not displayed.    Lipid Panel:  Recent Labs  Lab 09/07/21 0100  CHOL 255*  TRIG 678*  HDL 34*  CHOLHDL 7.5  VLDL UNABLE TO CALCULATE IF TRIGLYCERIDE OVER 400 mg/dL  LDLCALC UNABLE TO CALCULATE IF TRIGLYCERIDE OVER 400 mg/dL   WOEH2Z:  Recent Labs  Lab 09/07/21 0100  HGBA1C 5.3   Urine Drug Screen:  Recent Labs  Lab 09/05/21 1820  LABOPIA NONE DETECTED  COCAINSCRNUR NONE DETECTED  LABBENZ NONE DETECTED  AMPHETMU NONE DETECTED  THCU NONE DETECTED  LABBARB NONE DETECTED     Alcohol Level  Recent Labs  Lab 09/05/21 1755  ETH 206*     IMAGING past 24 hours CT HEAD WO CONTRAST  ( )  Result Date: 09/06/2021 CLINICAL DATA:  Intra cerebral hematoma EXAM: CT HEAD WITHOUT CONTRAST TECHNIQUE: Contiguous axial images were obtained from the base of the skull through the vertex without intravenous contrast. RADIATION DOSE REDUCTION: This exam was performed according to the departmental dose-optimization program which includes automated exposure control, adjustment of the mA and/or kV according to patient size and/or use of iterative reconstruction technique. COMPARISON:  Previous studies including the examination done earlier today FINDINGS: Brain: There is 6.6 x 2.8 x 4.5 cm intracerebral hematoma in the left insula and basal ganglia. Estimated volume  is 42 mL. Overall size of the hematoma has not changed significantly. There is surrounding edema. There is extrinsic pressure over the left lateral ventricle. There is approximately 3-4 mm shift of midline structures to the right. There are no epidural or subdural fluid collections. There is effacement of cortical sulci in the left frontoparietal cortex with no significant interval change. Rest of the cortical sulci are prominent. Vascular: Unremarkable. Skull: No fracture is seen in the calvarium. Sinuses/Orbits: Unremarkable. Other: None IMPRESSION: There is interest elbow hematoma measuring 6.6 x 2.8 x 4.5 cm in the left insula and basal ganglia with no significant interval change in size. There is 3-4 mm shift of midline structures to the right. No new focal intracranial abnormality is seen. Electronically Signed   By: Ernie Avena M.D.   On: 09/06/2021 16:57   [place] EKG SITE RITE  Result Date: 09/07/2021 If [facility] image not attached, placement could not be confirmed due to current cardiac rhythm.   PHYSICAL EXAM  Physical Exam  Constitutional: Appears well-developed and well-nourished.  Cardiovascular: Normal rate and regular rhythm.  Respiratory: Effort normal, non-labored breathing  Neuro: Mental Status: Patient is  very drowsy, did not speak during exam. Responds to name Following some one step central commands  with some delay. Cranial Nerves: II:  Pupils are equal, round, and reactive to light.   III,IV, VI: EOMI without ptosis or diploplia.   VII: Facial movement is symmetric resting and smiling VIII: Hearing is intact to voice X: Palate elevates symmetrically XI: Head is midline XII: Does not protrude tongue Motor: Tone is normal. Bulk is normal.  LUE, LLE 4/5 RUE, RLE 0/5  Right hemiplegia, Left antigravity Sensory: Sensation is symmetric to light touch in the arms and legs. No extinction to DSS present.  Cerebellar: Tremor noted bilaterally   ASSESSMENT/PLAN [name] is a 61 y.o. male with history of HTN, smoking, esophageal varcies presenting after he was found down on the concrete porch of his home.  He has been on the phone speaking with a relative and she heard him fall and he was able to state that he was on the ground and could not get up family member stated that this happened at 1645 on 2/25 and the son was able to get there within 5 minutes, he then called EMS.  On arrival he had decreased alertness, right hemineglect, dysarthric speech.  Hypertonic saline initiated for cerebral edema with mass effect. Hypertonic saline increased to 21ml/hr, most recent sodium 140. Repeat head CT ordered for later today.   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 Mild mass effect. Repeat CT- Interval increase in the size of the intraparenchymal hematoma centered in the left basal ganglia, with increased associated edema and 4 mm of left-to-right midline shift. MRI  Redemonstrated intraparenchymal hematoma centered in the left basal ganglia, which appears to have increased in size compared to the study  6 p.m. on 09/05/2021. There is  slightly increasing mass effect on the left lateral ventricle and 4 mm of left-to-right midline shift. 2D Echo EF 70-75%, grade 1 diastolic dysfunction VTE prophylaxis -SCDs No antithrombotic prior to admission, now on No antithrombotic. IPH Therapy recommendations:  CIR Disposition:  Pending  Cerebral edema with brain compression increased mass effect on left lateral ventricle Hypertonic saline increased to 67ml/hr Na- 138-> 140 -> 145  Hypertension Home meds:  Norvasc 5mg ,  increased Norvasc 10mg  Stable Keep SBP <160 Long-term BP goal normotensive  Other Stroke Risk Factors ETOH use, alcohol level 206, advised to drink no more than 2 drink(s) a day Cigarette smoker, advised to quit smoking .25ppd  Other Active Problems Alcohol withdrawal - CIWA protocol initiated  Ativan 1-2mg  prn  Hospital day # 2  Patient seen and examined by NP/APP with MD. MD to update note as needed.   Cortney E , MSN, AGACNP-BC [facility] See Amion for schedule and pager information 09/07/2021 1:44 PM  ATTENDING ATTESTATION: 61 year old gentleman history hypertension smoking, alcohol use presented to the ED he was found down on the concrete porch.  Found to have left basal ganglia hemorrhage on CT with right-sided weakness.  EtOH level is 206.  U tox negative.  On examination he can follow one-step commands no speech output.   Right hemiparesis.  Na still not at goal, will increase to 75cc/hr. On CIWA protocol with Ativan for his agitation. Able to eat his meals, holding on cortrak for now. CTH today to eval for ICH stability   Dr. 67 evaluated pt independently, reviewed imaging, chart, labs. Discussed and formulated plan with the APP. Please see APP note above for details.      This patient is critically ill due to respiratory distress, intracranial hemorrhage with edema on hypertonic saline and at significant risk of neurological worsening, death form heart failure,  respiratory failure, recurrent stroke, bleeding from [facility], seizure, sepsis. This patient's care requires constant monitoring of vital signs, hemodynamics, respiratory and cardiac monitoring, review of multiple databases, neurological assessment, discussion with family, other specialists and medical decision making of high complexity. I spent 35 minutes of neurocritical care time in the care of this patient.    Arneshia Ade,MD     To contact Stroke Continuity provider, please refer to [facility]. After hours, contact General Neurology

## 2021-09-07 NOTE — Progress Notes (Signed)
Speech Language Pathology Treatment: Dysphagia  Patient Details Name: Joshua Price MRN: [id number] DOB: June 17, 1961 Today's Date: 09/07/2021 Time: 0174-9449 SLP Time Calculation (min) (ACUTE ONLY): 13 min  Assessment / Plan / Recommendation Clinical Impression  Joshua Price was seen at bedside for dysphagia treatment and PO readiness. Patient was able to maintain focused attention to PO trials. Vocal quality clear throughout all trials. Slight R facial droop noted. Patient accepted ice chips 3x with min verbal cues to chew effectively. Patient was unable to effectively generate negative pressure to sip from a straw; SLP incorporated cup sips of thin liquids. Patient tolerated thin liquids without overt s/sx of aspiration. Puree textures were favorable to patient's performance; he formed adequate lip seal, lateralization, mastication and oral clearance of novel bolus. SLP branched up to solid graham cracker and pt demonstrated increased difficulty with this texture. Poor awareness, lateralization, and mastication noted. Patient required moderate verbal cues to chew. Moderate lingual residue noted after trial. SLP recommends upgrading the patient to Dys 2 diet with thin liquids.  Diagnostic treatment for cognitive-linguistic skills performed. Patient intermittently answered yes/no questions, however answers were not accurate/purposeful. Patient demonstrated deficits in both naming and repetition. Verbalizations were largely nonsensical and unintelligible. Patient is favoring left side and demonstrating right neglect. SLP to continue to follow acutely.  HPI HPI: Patient is a 61 y.o. male with PMH: HTN, tobacco use, esophageal varices who presented to ED via EMS on 2/25 as a code stroke after being found down and unable to get up on concrete porch of his home. EMS noted right sided weakness and incoherent speech. In ED, stat CT revealed acute left basal ganglia hemorrhage. on 2/25, MRI head revealed appearance  of increasing size of left basal ganglia intraparenchymal hematoma and slightly increasing mass effect on left lateral ventricle and 4mm of left to right midline shift. Patient failed [facility] swallow screen with nursing and has been NPO awaiting SLP swallow evaluation.      SLP Plan  Continue with current plan of care      Recommendations for follow up therapy are one component of a multi-disciplinary discharge planning process, led by the attending physician.  Recommendations may be updated based on patient status, additional functional criteria and insurance authorization.    Recommendations  Diet recommendations: Dysphagia 2 (fine chop);Thin liquid Liquids provided via: Cup Medication Administration: Crushed with puree Supervision: Staff to assist with self feeding Compensations: Minimize environmental distractions;Slow rate;Small sips/bites;Follow solids with liquid Postural Changes and/or Swallow Maneuvers: Seated upright 90 degrees                Oral Care Recommendations: Oral care QID;Staff/trained caregiver to provide oral care Follow Up Recommendations: Other (comment) Assistance recommended at discharge: Frequent or constant Supervision/Assistance SLP Visit Diagnosis: Cognitive communication deficit (R41.841);Dysarthria and anarthria (R47.1);Aphasia (R47.01) Plan: Continue with current plan of care           [facility]  09/07/2021, 10:48 AM

## 2021-09-07 NOTE — Progress Notes (Signed)
Attempted to call Milford Cage, mother listed on contact information to obtain consent for PICC placement. No answer.

## 2021-09-07 NOTE — Progress Notes (Signed)
Attempted to reach neuro MD Thomasena Edis on call x2 in regards to patients oral medications due at 2200.   SLP evaluated patient today and placed a DYS2 with thin liquids diet order. Upon my assessment of giving liquids to the patient, he coughed and appeared to choke. High risk for aspiration for this patient and I do not feel this diet is now appropriate. Patient will remain NPO for now.  -Placed SLP evaluation consult for tomorrow AM. -Held 2200 oral meds. Protonix given IV. -Spoke with pharmacy about possibility of metoprolol to be given IV if order is changed.  RN will continue to monitor.   Harriett Sine, RN

## 2021-09-08 ENCOUNTER — Inpatient Hospital Stay (HOSPITAL_COMMUNITY): Payer: Medicaid Other

## 2021-09-08 LAB — CBC
HCT: 41.3 % (ref 39.0–52.0)
Hemoglobin: 13.8 g/dL (ref 13.0–17.0)
MCH: 32.9 pg (ref 26.0–34.0)
MCHC: 33.4 g/dL (ref 30.0–36.0)
MCV: 98.6 fL (ref 80.0–100.0)
Platelets: 118 10*3/uL — ABNORMAL LOW (ref 150–400)
RBC: 4.19 MIL/uL — ABNORMAL LOW (ref 4.22–5.81)
RDW: 14 % (ref 11.5–15.5)
WBC: 5.4 10*3/uL (ref 4.0–10.5)
nRBC: 0 % (ref 0.0–0.2)

## 2021-09-08 LAB — SODIUM
Sodium: 155 mmol/L — ABNORMAL HIGH (ref 135–145)
Sodium: 155 mmol/L — ABNORMAL HIGH (ref 135–145)

## 2021-09-08 LAB — BASIC METABOLIC PANEL
Anion gap: 11 (ref 5–15)
BUN: <5 mg/dL — ABNORMAL LOW (ref 6–20)
CO2: 24 mmol/L (ref 22–32)
Calcium: 9.2 mg/dL (ref 8.9–10.3)
Chloride: 119 mmol/L — ABNORMAL HIGH (ref 98–111)
Creatinine, Ser: 0.97 mg/dL (ref 0.61–1.24)
GFR, Estimated: >60 mL/min (ref >60)
Glucose, Bld: 147 mg/dL — ABNORMAL HIGH (ref 70–99)
Potassium: 3.2 mmol/L — ABNORMAL LOW (ref 3.5–5.1)
Sodium: 154 mmol/L — ABNORMAL HIGH (ref 135–145)

## 2021-09-08 LAB — TRIGLYCERIDES: Triglycerides: 287 mg/dL — ABNORMAL HIGH (ref <150)

## 2021-09-08 LAB — PHOSPHORUS: Phosphorus: 2.6 mg/dL (ref 2.5–4.6)

## 2021-09-08 MED ORDER — POTASSIUM CHLORIDE 10 MEQ/100ML IV SOLN
10.0000 meq | INTRAVENOUS | Status: AC
  Administered 2021-09-08 (×4): 10 meq via INTRAVENOUS
  Filled 2021-09-08 (×4): qty 100

## 2021-09-08 MED ORDER — ACETAMINOPHEN 500 MG PO TABS: 1000.0000 mg | ORAL_TABLET | Freq: Four times a day (QID) | ORAL | Status: DC

## 2021-09-08 MED ORDER — ACETAMINOPHEN 10 MG/ML IV SOLN
1000.0000 mg | Freq: Four times a day (QID) | INTRAVENOUS | Status: DC
  Administered 2021-09-08 (×2): 1000 mg via INTRAVENOUS
  Filled 2021-09-08 (×2): qty 100

## 2021-09-08 MED ORDER — LABETALOL HCL 5 MG/ML IV SOLN
5.0000 mg | INTRAVENOUS | Status: AC
Start: 2021-09-08 — End: 2021-09-08
  Administered 2021-09-08: 5 mg via INTRAVENOUS
  Filled 2021-09-08: qty 4

## 2021-09-08 MED ORDER — ACETAMINOPHEN 160 MG/5ML PO SOLN
1000.0000 mg | Freq: Four times a day (QID) | ORAL | Status: DC
  Administered 2021-09-08: 1000 mg via ORAL
  Filled 2021-09-08: qty 40.6

## 2021-09-08 MED ORDER — PANTOPRAZOLE SODIUM 40 MG PO TBEC
40.0000 mg | DELAYED_RELEASE_TABLET | Freq: Every day | ORAL | Status: DC
Start: 2021-09-08 — End: 2021-09-09

## 2021-09-08 NOTE — Progress Notes (Signed)
This RN spoke with MD Thomasena Edis about previous concerns, and new concern of patient combative, agitated, and pulling at lines.  Orders received:  -Bilateral non-violent wrist restraints -acetaminophen IV 1,000mg  every 6 hours -labetalol injection 5mg    RN will continue to monitor.  , RN

## 2021-09-08 NOTE — Progress Notes (Addendum)
STROKE TEAM PROGRESS NOTE   INTERVAL HISTORY Patient continues to be drowsy after lorazepam administration overnight.  He was febrile overnight, and blood cultures were drawn and tylenol added.  Chest x-ray reveled no consolidation or infiltrates.  Blood pressure controlled well with Cleviprex.  Will insert NG tube to allow for PO blood pressure medications.  Vitals:   09/08/21 1130 09/08/21 1145 09/08/21 1200 09/08/21 1215  BP: 136/87 138/82 (!) 141/129 136/84  Pulse: 91 95 (!) 109 91  Resp:      Temp:   (!) 97.5 F (36.4 C)   TempSrc:   Oral   SpO2: 96% 96% 94% 93%  Weight:      Height:       CBC:  Recent Labs  Lab 09/05/21 1820 09/05/21 1904 09/07/21 0751 09/08/21 0615  WBC 4.3  --  5.0 5.4  NEUTROABS 1.7  --   --   --   HGB 12.9*   < > 13.5 13.8  HCT 38.0*   < > 38.8* 41.3  MCV 95.5  --  97.2 98.6  PLT 112*  --  121* 118*   < > = values in this interval not displayed.    Basic Metabolic Panel:  Recent Labs  Lab 09/07/21 0751 09/07/21 1250 09/08/21 0615 09/08/21 1132  NA 145   < > 154* 155*  K 3.3*  --  3.2*  --   CL 110  --  119*  --   CO2 26  --  24  --   GLUCOSE 142*  --  147*  --   BUN 6  --  <5*  --   CREATININE 0.87  --  0.97  --   CALCIUM 8.8*  --  9.2  --   MG 2.2  --   --   --   PHOS 2.4*  --  2.6  --    < > = values in this interval not displayed.    Lipid Panel:  Recent Labs  Lab 09/07/21 0100 09/08/21 0615  CHOL 255*  --   TRIG 678* 287*  HDL 34*  --   CHOLHDL 7.5  --   VLDL UNABLE TO CALCULATE IF TRIGLYCERIDE OVER 400 mg/dL  --   LDLCALC UNABLE TO CALCULATE IF TRIGLYCERIDE OVER 400 mg/dL  --     HgbA1c:  Recent Labs  Lab 09/07/21 0100  HGBA1C 5.3    Urine Drug Screen:  Recent Labs  Lab 09/05/21 1820  LABOPIA NONE DETECTED  COCAINSCRNUR NONE DETECTED  LABBENZ NONE DETECTED  AMPHETMU NONE DETECTED  THCU NONE DETECTED  LABBARB NONE DETECTED     Alcohol Level  Recent Labs  Lab 09/05/21 1755  ETH 206*     IMAGING  past 24 hours DG Abd 1 View  Result Date: 09/08/2021 CLINICAL DATA:  Enteric tube placement. EXAM: ABDOMEN - 1 VIEW COMPARISON:  None. FINDINGS: Enteric tube appropriately positioned within the stomach. The bowel gas pattern is normal. No radio-opaque calculi or other significant radiographic abnormality are seen. IMPRESSION: 1. Enteric tube within the stomach. Electronically Signed   By: Titus Dubin M.D.   On: 09/08/2021 10:39   CT HEAD WO CONTRAST (5MM)  Result Date: 09/07/2021 CLINICAL DATA:  Hemorrhage follow-up EXAM: CT HEAD WITHOUT CONTRAST TECHNIQUE: Contiguous axial images were obtained from the base of the skull through the vertex without intravenous contrast. RADIATION DOSE REDUCTION: This exam was performed according to the departmental dose-optimization program which includes automated exposure control, adjustment of the  mA and/or kV according to patient size and/or use of iterative reconstruction technique. COMPARISON:  None. FINDINGS: Brain: Unchanged size of large intraparenchymal hematoma centered in the left basal ganglia. Trace rightward midline shift is unchanged. There is persistent mass effect on the left lateral ventricle without hydrocephalus. Vascular: No abnormal hyperdensity of the major intracranial arteries or dural venous sinuses. No intracranial atherosclerosis. Skull: The visualized skull base, calvarium and extracranial soft tissues are normal. Sinuses/Orbits: No fluid levels or advanced mucosal thickening of the visualized paranasal sinuses. No mastoid or middle ear effusion. The orbits are normal. IMPRESSION: Unchanged size of large intraparenchymal hematoma centered in the left basal ganglia with unchanged trace rightward midline shift. Electronically Signed   By: Ulyses Jarred M.D.   On: 09/07/2021 19:02   DG CHEST PORT 1 VIEW  Result Date: 09/08/2021 CLINICAL DATA:  Aspiration. EXAM: PORTABLE CHEST 1 VIEW COMPARISON:  Chest radiograph dated 09/05/2021. FINDINGS:  Mild cardiomegaly with mild central vascular congestion. No focal consolidation, pleural effusion or pneumothorax. Atherosclerotic calcification of the aorta. Acute osseous pathology. IMPRESSION: Mild cardiomegaly with mild central vascular congestion. No focal consolidation. Electronically Signed   By: Anner Crete M.D.   On: 09/08/2021 02:58    PHYSICAL EXAM  Physical Exam  Constitutional: Appears well-developed and well-nourished.  Cardiovascular: Normal rate and regular rhythm.  Respiratory: Effort normal, non-labored breathing  Neuro: Mental Status: Patient is very drowsy, did not speak during exam. Responds to name Not following commands today but will move all extremities to noxious stimuli Cranial Nerves: II:  Pupils are equal, round, and reactive to light.   III,IV, VI: EOMI without ptosis or diploplia.  XI: Head is midline XII: Does not protrude tongue Motor: Tone is normal. Bulk is normal.  LUE, LLE 4/5 RUE, RLE 0/5  Right hemiplegia, Left antigravity Sensory: Sensation is symmetric to light touch in the arms and legs. No extinction to DSS present.  Cerebellar: Tremor noted bilaterally   ASSESSMENT/PLAN Joshua Price is a 61 y.o. male with history of HTN, smoking, esophageal varcies presenting after he was found down on the concrete porch of his home.  He has been on the phone speaking with a relative and she heard him fall and he was able to state that he was on the ground and could not get up family member stated that this happened at 1645 on 2/25 and the son was able to get there within 5 minutes, he then called EMS.  On arrival he had decreased alertness, right hemineglect, dysarthric speech.  Hypertonic saline initiated for cerebral edema with mass effect. Hypertonic saline increased to 38ml/hr, most recent sodium 155. Repeat head CT performed yesterday afternoon reveals unchanged IPH.  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 Mild mass effect. Repeat CT- Interval increase in the size of the intraparenchymal hematoma centered in the left basal ganglia, with increased associated edema and 4 mm of left-to-right midline shift. MRI  Redemonstrated intraparenchymal hematoma centered in the left basal ganglia, which appears to have increased in size compared to the study  6 p.m. on 09/05/2021. There is slightly increasing mass effect on the left lateral ventricle and 4 mm of left-to-right midline shift. Repeat head CT 2/27 stable IPH and stable midline shift 2D Echo EF [id number], grade 1 diastolic dysfunction VTE prophylaxis -SCDs No antithrombotic prior to admission, now on No antithrombotic. IPH Therapy recommendations:  CIR Disposition:  Pending  Cerebral edema with brain compression increased mass effect on left lateral ventricle Hypertonic saline increased to 5ml/hr Na- 138-> 140 -> 145 ->155  Hypertension Home meds:  Norvasc 5mg , increased Norvasc 10mg  Stable Keep SBP <160 Long-term BP goal normotensive  Other Stroke Risk Factors ETOH use, alcohol level 206, advised to drink no more than 2 drink(s) a day Cigarette smoker, advised to quit smoking .25ppd  Other Active Problems Alcohol withdrawal - CIWA protocol initiated  Ativan 1-2mg  prn  Fever Febrile overnight to 100.7 CXR shows no new infiltrates or consolidation Blood cultures drawn  Hospital day # 3  Patient seen and examined by NP/APP with MD. MD to update note as needed.   [place] , MSN, AGACNP-BC [facility] See Amion for schedule and pager information 09/08/2021 1:33 PM  ATTENDING ATTESTATION: 61 year old gentleman history hypertension smoking, alcohol use presented to the ED he was found down on the concrete porch.  Found to have left basal ganglia hemorrhage on CT with right-sided  weakness.  EtOH level is 206 on admission.  U tox negative.  On examination he is sedated, does not follow commands. no speech output.   on hypertonic saline we will start to wean.  On CIWA protocol with Ativan for his agitation.  Repeat head CT from yesterday shows stable hematoma.  NG tube and start oral medications for blood pressure.  Febrile overnight blood cultures ordered and pending.  We will follow    Dr. Reeves Forth evaluated pt independently, reviewed imaging, chart, labs. Discussed and formulated plan with the APP. Please see APP note above for details.      This patient is critically ill due to respiratory distress, intracranial hemorrhage with edema on hypertonic saline and at significant risk of neurological worsening, death form heart failure, respiratory failure, recurrent stroke, bleeding from [facility], seizure, sepsis. This patient's care requires constant monitoring of vital signs, hemodynamics, respiratory and cardiac monitoring, review of multiple databases, neurological assessment, discussion with family, other specialists and medical decision making of high complexity. I spent 35 minutes of neurocritical care time in the care of this patient.    Vaidehi Braddy,MD    To contact Stroke Continuity provider, please refer to [email or web address] After hours, contact General Neurology

## 2021-09-08 NOTE — Progress Notes (Signed)
Physical Therapy Treatment Patient Details Name: Joshua Price MRN: [id number] DOB: 26-Sep-1960 Today's Date: 09/08/2021   History of Present Illness 61 yo male presents to [facility] on 2/25 with fall at home, + ETOH, BP 218/134. CT head reveals acute parenchymal hemorrhage involving left lentiform nucleus and  surrounding white matter; repeat CT shows interval increase in side of intraparenchymal hematoma centered in L BG, increased edema and 56mm L-to-R midline shift. PMH includes HTN, smoker, esophageal varices.    PT Comments    Pt was not responsive to questions and followed commands inconsistently. He was able to get to EOB with with total assist. With sitting balance he was able to occasionally self correct and needed up to Max assist. Pt fatigued easily with sitting balance and 2 attempts at standing. During treatment session patient showed deficits in strength, endurance, activity tolerance, and cognition. Recommending therapy services at acute inpatient rehab to address the previously stated deficits. Will continue to follow acutely to maximize functional mobility, independence, and safety.     Recommendations for follow up therapy are one component of a multi-disciplinary discharge planning process, led by the attending physician.  Recommendations may be updated based on patient status, additional functional criteria and insurance authorization.  Follow Up Recommendations  Acute inpatient rehab (3hours/day)     Assistance Recommended at Discharge Frequent or constant Supervision/Assistance  Patient can return home with the following Two people to help with walking and/or transfers;Two people to help with bathing/dressing/bathroom;Direct supervision/assist for medications management;Direct supervision/assist for financial management;Assist for transportation;Help with stairs or ramp for entrance;Assistance with cooking/housework;Assistance with feeding   Equipment Recommendations  Other  (comment) (TBD as pt progresses)    Recommendations for Other Services       Precautions / Restrictions Precautions Precautions: Fall Precaution Comments: "pusher" to right at EOB Restrictions Weight Bearing Restrictions: No     Mobility  Bed Mobility Overal bed mobility: Needs Assistance Bed Mobility: Rolling, Supine to Sit, Sit to Supine Rolling: Total assist   Supine to sit: Total assist, +2 for physical assistance, HOB elevated Sit to supine: +2 for physical assistance, Total assist   General bed mobility comments: Pt was resisting rolling to the left. He was unable to assist with bed mobility.    Transfers Overall transfer level: Needs assistance Equipment used: 2 person hand held assist Transfers: Sit to/from Stand Sit to Stand: Max assist, +2 physical assistance           General transfer comment: Pt was able to weightbear through his feet and get up off the bed but was unable to stand up straight. 2 STS atempts were made.    Ambulation/Gait                   Stairs             Wheelchair Mobility    Modified Rankin (Stroke Patients Only)       Balance Overall balance assessment: Needs assistance Sitting-balance support: Single extremity supported, Feet supported Sitting balance-Leahy Scale: Zero Sitting balance - Comments: Heavy pushing towards R, was able to bear weight through his R elbow. Pt was able to self correct occasionally with multimodal cuing and occasionally without. When support was provided pt would occasionlly lean harder into the support. Postural control: Posterior lean, Right lateral lean Standing balance support: Bilateral upper extremity supported Standing balance-Leahy Scale: Zero Standing balance comment: Max +2 sit to stand from bed x2 (this did not help with his pushing when he sat  back down). He was unable to stand all the way up with his knees straight and his torso upright even with assistance.                             Cognition Arousal/Alertness: Lethargic Behavior During Therapy: Flat affect Overall Cognitive Status: Difficult to assess Area of Impairment: Following commands, Safety/judgement, Awareness, Problem solving, Attention                       Following Commands: Follows one step commands inconsistently Safety/Judgement: Decreased awareness of safety, Decreased awareness of deficits Awareness: Intellectual Problem Solving: Slow processing, Decreased initiation, Difficulty sequencing, Requires verbal cues, Requires tactile cues General Comments: Cognition was difficult to assess due to aphasia. Pt has possible apraxia, difficult to assess.        Exercises      General Comments        Pertinent Vitals/Pain Pain Assessment Pain Assessment: CPOT Facial Expression: Relaxed, neutral Body Movements: Restlessness Muscle Tension: Tense, rigid Compliance with ventilator (intubated pts.): N/A Vocalization (extubated pts.): N/A CPOT Total: 3 Pain Location: Pt was non verbal during todays session.    Home Living                          Prior Function            PT Goals (current goals can now be found in the care plan section)      Frequency    Min 4X/week      PT Plan Current plan remains appropriate    Co-evaluation              AM-PAC PT "6 Clicks" Mobility   Outcome Measure  Help needed turning from your back to your side while in a flat bed without using bedrails?: Total Help needed moving from lying on your back to sitting on the side of a flat bed without using bedrails?: Total Help needed moving to and from a bed to a chair (including a wheelchair)?: Total Help needed standing up from a chair using your arms (e.g., wheelchair or bedside chair)?: Total Help needed to walk in hospital room?: Total Help needed climbing 3-5 steps with a railing? : Total 6 Click Score: 6    End of Session Equipment Utilized During  Treatment: Gait belt Activity Tolerance: Patient limited by fatigue Patient left: in bed;with call bell/phone within reach;with bed alarm set;with nursing/sitter in room Nurse Communication: Mobility status PT Visit Diagnosis: Other abnormalities of gait and mobility (R26.89);Muscle weakness (generalized) (M62.81);Other symptoms and signs involving the nervous system (R29.898);Hemiplegia and hemiparesis Hemiplegia - Right/Left: Right Hemiplegia - dominant/non-dominant: Dominant Hemiplegia - caused by: Nontraumatic intracerebral hemorrhage     Time: [id number] PT Time Calculation (min) (ACUTE ONLY): 37 min  Charges:  $Therapeutic Activity: 23-37 mins                     Joshua Price, SPT    Joshua Price 09/08/2021, 5:57 PM

## 2021-09-08 NOTE — Plan of Care (Signed)
RN Tami Lin reported vascular team unable to gain consent from family members, and patient unable to give consent due to disorientation.  Discussed with RN Tami Lin that since she reported a serum Na of 149 and follow up CT 09/07/2021 19:02 only showed trace midline shift and was stable compared to earlier CT we will continue 3% at 75cc/hr until the morning.   Electronically signed by:  Marisue Humble, MD Page: [phone] 09/07/2021, 20:47

## 2021-09-08 NOTE — Progress Notes (Signed)
Attempted to reach MD [facility] again x1. Have not received call back.

## 2021-09-08 NOTE — Progress Notes (Signed)
Still unable to get consent for PICC placement. Primary RN made aware and will update IV team when necessary.

## 2021-09-08 NOTE — Progress Notes (Signed)
Inpatient Rehab Admissions Coordinator:   I spoke with Pt.'s mother regarding CIR admission for pt. She states that she and her husband are elderly and cannot provide care for Pt. When he leaves rehab; however, he lives with his son who may be able to provide care (though son currently works full time. I called son to discuss and left a VM.  Megan Salon, MS, CCC-SLP Rehab Admissions Coordinator  [phone] (celll) [phone] (office)

## 2021-09-08 NOTE — Progress Notes (Addendum)
Paged Neuro MD Crist Infante 3 about patients medications due at 2200 as well as tachycardia. Pt pulled NG out. Able to get ahold of Dr. Thomasena Edis at [phone] by cellphone. He is okay with holding 2200 medications and allowing cleviprex up to 32mg /hr if needed.  ?

## 2021-09-08 NOTE — Progress Notes (Addendum)
Speech Language Pathology Treatment: Dysphagia  Patient Details Name: Joshua Price MRN: [id number] DOB: [birth year] Today's Date: 09/08/2021 Time: 6237-6283 SLP Time Calculation (min) (ACUTE ONLY): 9 min  Assessment / Plan / Recommendation Clinical Impression  Pt presents with moderate to severe risk of aspiration in setting of recent LBG ICH.  Pt with worsening clinical presentation with POs overnight and SLP received request for repeat swallow evaluation.  Pt was lethargic on SLP arrival but able to rouse.  Pt lethargic and noted to fall asleep intermittently during session.  SLP provided oral care.  Dried brown secretions in oral cavity, possible bleeding.  Front tooth very loose to brushing with oral swab.  Please continue thorough oral care.  With trials of thin liquid by both cup and straw there was anterior spillage of moderate amount of bolus.  There was forceful expulsion of thin liquid during swallow as well as passive loss prior to swallow.  Pt required multiple swallows per bolus.  There was immediate throat clear on 1 of 2 trials of thin liquid.  With puree pt required multiple swallows.  Spoke with RN who reports plan for NGT for nutrition in part 2/2 level of arousal and worsening clinical presentation with POs.  Given oral containment issues, level of arousal, and plans for AMN, recommend holding further evaluation until pt is more able to participate. Recommend instrumental assessment FEES or MBSS prior to initiation of PO diet given location of ICH and increased risk for silent aspiration.  Recommend NPO with short term alternate means of nutrition, hydration, and medication.    HPI HPI: Patient is a 61 y.o. male with PMH: HTN, tobacco use, esophageal varices who presented to ED via EMS on 2/25 as a code stroke after being found down and unable to get up on concrete porch of his home. EMS noted right sided weakness and incoherent speech. In ED, stat CT revealed acute left basal ganglia  hemorrhage. on 2/25, MRI head revealed appearance of increasing size of left basal ganglia intraparenchymal hematoma and slightly increasing mass effect on left lateral ventricle and 61mm of left to right midline shift.      SLP Plan  Continue with current plan of care;MBS      Recommendations for follow up therapy are one component of a multi-disciplinary discharge planning process, led by the attending physician.  Recommendations may be updated based on patient status, additional functional criteria and insurance authorization.    Recommendations  Diet recommendations: NPO Medication Administration: Via alternative means                Oral Care Recommendations: Oral care QID;Staff/trained caregiver to provide oral care Follow Up Recommendations:  (TBD) Assistance recommended at discharge: Frequent or constant Supervision/Assistance SLP Visit Diagnosis: Dysphagia, unspecified (R13.10) Plan: Continue with current plan of care;MBS           Kerrie Pleasure, MA, CCC-SLP Acute Rehabilitation Services Office: [phone]  09/08/2021, 10:06 AM

## 2021-09-08 NOTE — Progress Notes (Signed)
RN Tami Lin relayed patient not able to tolerate PO and he had scheduled metoprolol PO. He was still on clevidipine infusion and his blood pressures were controlled at the time and there was plenty of room to increase. When asked if he needed a PRN labetalol she again stated his pressures were controlled and at the time was not necessary and the beta blocker was actually scheduled. Since he is NPO for now and his pressures are controlled and he has labetalol PRN will hold off until morning.   RN [facility] added patient had increasing temperature 100.7 and ordered Ofirmev 1,000mg  q6r PRN with chest x ray and blood cultures x2

## 2021-09-09 ENCOUNTER — Inpatient Hospital Stay (HOSPITAL_COMMUNITY): Payer: Medicaid Other

## 2021-09-09 DIAGNOSIS — E440 Moderate protein-calorie malnutrition: Secondary | ICD-10-CM | POA: Insufficient documentation

## 2021-09-09 LAB — BASIC METABOLIC PANEL
Anion gap: 11 (ref 5–15)
BUN: 6 mg/dL (ref 6–20)
CO2: 22 mmol/L (ref 22–32)
Calcium: 9.2 mg/dL (ref 8.9–10.3)
Chloride: 128 mmol/L — ABNORMAL HIGH (ref 98–111)
Creatinine, Ser: 1 mg/dL (ref 0.61–1.24)
GFR, Estimated: >60 mL/min (ref >60)
Glucose, Bld: 125 mg/dL — ABNORMAL HIGH (ref 70–99)
Potassium: 3 mmol/L — ABNORMAL LOW (ref 3.5–5.1)
Sodium: 161 mmol/L (ref 135–145)

## 2021-09-09 LAB — CBC
HCT: 42.2 % (ref 39.0–52.0)
Hemoglobin: 14.9 g/dL (ref 13.0–17.0)
MCH: 35.4 pg — ABNORMAL HIGH (ref 26.0–34.0)
MCHC: 35.3 g/dL (ref 30.0–36.0)
MCV: 100.2 fL — ABNORMAL HIGH (ref 80.0–100.0)
Platelets: 121 10*3/uL — ABNORMAL LOW (ref 150–400)
RBC: 4.21 MIL/uL — ABNORMAL LOW (ref 4.22–5.81)
RDW: 14.3 % (ref 11.5–15.5)
WBC: 5.8 10*3/uL (ref 4.0–10.5)
nRBC: 0 % (ref 0.0–0.2)

## 2021-09-09 LAB — FOLATE: Folate: 22.5 ng/mL (ref >5.9)

## 2021-09-09 LAB — VITAMIN B12: Vitamin B-12: 1384 pg/mL — ABNORMAL HIGH (ref 180–914)

## 2021-09-09 LAB — PHOSPHORUS: Phosphorus: 4.3 mg/dL (ref 2.5–4.6)

## 2021-09-09 LAB — SODIUM
Sodium: 159 mmol/L — ABNORMAL HIGH (ref 135–145)
Sodium: 160 mmol/L — ABNORMAL HIGH (ref 135–145)
Sodium: 160 mmol/L — ABNORMAL HIGH (ref 135–145)

## 2021-09-09 LAB — GLUCOSE, CAPILLARY
Glucose-Capillary: 136 mg/dL — ABNORMAL HIGH (ref 70–99)
Glucose-Capillary: 162 mg/dL — ABNORMAL HIGH (ref 70–99)

## 2021-09-09 LAB — MAGNESIUM: Magnesium: 1.9 mg/dL (ref 1.7–2.4)

## 2021-09-09 MED ORDER — SENNOSIDES-DOCUSATE SODIUM 8.6-50 MG PO TABS
1.0000 | ORAL_TABLET | Freq: Two times a day (BID) | ORAL | Status: DC
  Administered 2021-09-11 – 2021-09-13 (×6): 1
  Filled 2021-09-09 (×8): qty 1

## 2021-09-09 MED ORDER — METOPROLOL TARTRATE 25 MG/10 ML ORAL SUSPENSION
25.0000 mg | Freq: Two times a day (BID) | ORAL | Status: DC
  Administered 2021-09-09 – 2021-09-19 (×20): 25 mg
  Filled 2021-09-09 (×21): qty 10

## 2021-09-09 MED ORDER — THIAMINE HCL 100 MG PO TABS
100.0000 mg | ORAL_TABLET | Freq: Every day | ORAL | Status: DC
  Administered 2021-09-09 – 2021-09-10 (×2): 100 mg
  Filled 2021-09-09 (×2): qty 1

## 2021-09-09 MED ORDER — HYDRALAZINE HCL 20 MG/ML IJ SOLN
20.0000 mg | Freq: Four times a day (QID) | INTRAMUSCULAR | Status: DC | PRN
  Administered 2021-09-09 – 2021-09-11 (×3): 20 mg via INTRAVENOUS
  Filled 2021-09-09 (×3): qty 1

## 2021-09-09 MED ORDER — PROSOURCE TF PO LIQD
45.0000 mL | Freq: Every day | ORAL | Status: DC
  Administered 2021-09-09 – 2021-09-15 (×7): 45 mL
  Filled 2021-09-09 (×6): qty 45

## 2021-09-09 MED ORDER — POLYETHYLENE GLYCOL 3350 17 G PO PACK
17.0000 g | PACK | Freq: Every day | ORAL | Status: DC
  Administered 2021-09-09: 17 g via ORAL

## 2021-09-09 MED ORDER — POTASSIUM CHLORIDE 20 MEQ PO PACK
40.0000 meq | PACK | ORAL | Status: AC
  Administered 2021-09-09 (×2): 40 meq
  Filled 2021-09-09 (×2): qty 2

## 2021-09-09 MED ORDER — PANTOPRAZOLE 2 MG/ML SUSPENSION
40.0000 mg | Freq: Every day | ORAL | Status: DC
  Administered 2021-09-09 – 2021-09-18 (×10): 40 mg
  Filled 2021-09-09 (×10): qty 20

## 2021-09-09 MED ORDER — ACETAMINOPHEN 650 MG RE SUPP
975.0000 mg | Freq: Four times a day (QID) | RECTAL | Status: DC
  Administered 2021-09-09: 975 mg via RECTAL
  Filled 2021-09-09: qty 2

## 2021-09-09 MED ORDER — AMLODIPINE BESYLATE 10 MG PO TABS
10.0000 mg | ORAL_TABLET | Freq: Every day | ORAL | Status: DC
  Administered 2021-09-10 – 2021-09-19 (×10): 10 mg
  Filled 2021-09-09 (×10): qty 1

## 2021-09-09 MED ORDER — POLYETHYLENE GLYCOL 3350 17 G PO PACK
17.0000 g | PACK | Freq: Every day | ORAL | Status: DC
  Administered 2021-09-11 – 2021-09-13 (×3): 17 g
  Filled 2021-09-09 (×4): qty 1

## 2021-09-09 MED ORDER — VITAL 1.5 CAL PO LIQD
1000.0000 mL | ORAL | Status: DC
  Administered 2021-09-09 – 2021-09-14 (×3): 1000 mL
  Filled 2021-09-09 (×6): qty 1000

## 2021-09-09 MED ORDER — ADULT MULTIVITAMIN W/MINERALS CH
1.0000 | ORAL_TABLET | Freq: Every day | ORAL | Status: DC
  Administered 2021-09-09 – 2021-09-19 (×11): 1
  Filled 2021-09-09 (×11): qty 1

## 2021-09-09 MED ORDER — FOLIC ACID 1 MG PO TABS
1.0000 mg | ORAL_TABLET | Freq: Every day | ORAL | Status: DC
  Administered 2021-09-09 – 2021-09-19 (×11): 1 mg
  Filled 2021-09-09 (×11): qty 1

## 2021-09-09 NOTE — Progress Notes (Signed)
Inpatient Rehab Admissions Coordinator:  ? ?I have spoken to Pt.'s son who reports family is not able to provide 24/7 mod assist which Pt. Would likely need following CIR. Pt. Likely to need long term care. 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 CIR will sign off at this time.  ? ?Clemens Catholic, MS, CCC-SLP ?Rehab Admissions Coordinator  ?[phone] (celll) ?[phone] (office) ? ?

## 2021-09-09 NOTE — Progress Notes (Addendum)
Speech Language Pathology Treatment: Dysphagia;Cognitive-Linquistic  ?Patient Details ?Name: Chadric Wade ?MRN: [id number] ?DOB: 18-Nov-1960 ?Today's Date: 09/09/2021 ?Time: 0940-1000 ?SLP Time Calculation (min) (ACUTE ONLY): 20 min ? ?Assessment / Plan / Recommendation ?Clinical Impression ? Pt fully alert for SLP, though he has been observed to be intermittently lethargic. SLP and RN completed oral care; pt has continual bleeding around his teeth and gums. After oral care pt able to accept ice chips, cup sips of water and bites of puree without cueing needed to attend. He did have poor labial seal on the right due to CNV weakness but also dentition impeding labial seal. Pt had intermittent coughing with thin liquids, but tolerated purees and ice well. Recommend MBS for instrumental assessment of swallowing. MBS will be completed at 1330 today. Pt may need alternate method of nutrition over the short term for consistent administration of medication given waxing and waning arousal. Pt today able to say his name when asked, but spelled his last name despite model to repeat. Pt is dysarthric. He was also able to read numbers 1-6 with dysarthria. Otherwise pt demonstrated severe language impairment with no further comprehension of language tasks despite visual cues and modeling. 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 Will continue efforts.   ?HPI HPI: Patient is a 61 y.o. male with PMH: HTN, tobacco use, esophageal varices who presented to ED via EMS on 2/25 as a code stroke after being found down and unable to get up on concrete porch of his home. EMS noted right sided weakness and incoherent speech. In ED, stat CT revealed acute left basal ganglia hemorrhage. on 2/25, MRI head revealed appearance of increasing size of left basal ganglia intraparenchymal hematoma and slightly increasing mass effect on left lateral ventricle and 47mm of left to right midline shift. ?  ?   ?SLP Plan ?  MBS ? ?  ?  ?Recommendations for follow up therapy are one component of a multi-disciplinary discharge planning process, led by the attending physician.  Recommendations may be updated based on patient status, additional functional criteria and insurance authorization. ?  ? ?Recommendations  ?   ?   ?    ?   ? ? ? ? Plan: MBS ? ? ? ? ?  ?  ? ? ?Xayvion Shirah, Katherene Ponto ? ?09/09/2021, 10:21 AM ?

## 2021-09-09 NOTE — Progress Notes (Addendum)
Initial Nutrition Assessment ? ?DOCUMENTATION CODES:  ?Non-severe (moderate) malnutrition in context of social or environmental circumstances ? ?INTERVENTION:  ?Pt at moderate risk for refeeding due to moderate malnutrition - monitor labs ?Initiate tube feeding via cortrak tube, once in place: ?Vital 1.5 at 60 ml/h (1440 ml per day). Start at 47mL/h and advance by 22mL q6h to goal rate. ?Prosource TF 45 ml 1x/d to provide 40kcal and 11g of protein ?Standard free water flush of 62mL q4h to maintain tube patency  ?Regimen provides 2200 kcal, 108 gm protein, 1280 ml free water daily (TF+flush) ?MVI, thiamine, and folic acid daily for hx of EtOH abuse ?Assess nutrition labs to screen for deficiencies (Vitamin A, Vitamin D, thiamin, folate, vitamin b12, Vitamin C, and zinc) ? ?NUTRITION DIAGNOSIS:  ?Moderate Malnutrition related to social / environmental circumstances (EtOH abuse) as evidenced by mild fat depletion, moderate fat depletion, mild muscle depletion, severe muscle depletion. ? ?GOAL:  ?Patient will meet greater than or equal to 90% of their needs ? ?MONITOR:  ?Diet advancement, TF tolerance, Labs ? ?REASON FOR ASSESSMENT:  ?Other (Comment) (new cortrak) ?  ? ?ASSESSMENT:  ?Pt with a history of HTN, tobacco abuse (.25 pack/d), EtOH abuse, and esophageal varices presented to ED as a code stroke after being found down at home. In ED, pt in hypertensive crisis and imaging showing an acute left basal ganglia hemorrhage. ? ?Hx of EtOH abuse noted in 2016 by PCP and ethanol level 206 on admission. Pt showing signs of withdrawal prompting CIWA initiation 2/25. ? ?Pt resting in bed at the time of assessment. Pt drowsy, did not answer questions and only briefly opened eyes during exam. No enteral access at this time, cortrak to be placed today. Pt currently receiving cleviprex for blood pressure control but medication will be adjusted to PO regimen once enteral access is available. ? ?SLP advanced diet to DYS2 on 2/27,  but after pt showed signs of aspiration and coughing with RN, was placed back to NPO later that day. No diet since that time. Spoke with SLP in hallway, plans to evaluate today.  ? ?Muscle and fat deficits seen throughout body on exam suggestive of prolonged inadequate nutrition. Limited weight hx available to determine if recent weight loss has occurred. ? ? ?Intake/Output Summary (Last 24 hours) at 09/09/2021 1029 ?Last data filed at 09/09/2021 0600 ?Gross per 24 hour  ?Intake 2578.21 ml  ?Output 2000 ml  ?Net 578.21 ml  ?Net IO Since Admission: -1,469.15 mL [09/09/21 1029] ? ?Average Meal Intake: ?2/27: 75% intake x 1 recorded meal prior to being made NPO again ? ?Nutritionally Relevant Medications: ?Scheduled Meds: ? pantoprazole  40 mg Oral QHS  ? senna-docusate  1 tablet Oral BID  ?Continuous Infusions: ? clevidipine 8 mg/hr (09/09/21 0714)  ? ?Labs Reviewed: ?Sodium 161 ?Potassium 3.0 ? ?NUTRITION - FOCUSED PHYSICAL EXAM: ?Flowsheet Row Most Recent Value  ?Orbital Region Mild depletion  ?Upper Arm Region Moderate depletion  ?Thoracic and Lumbar Region Mild depletion  ?Buccal Region Mild depletion  ?Temple Region Mild depletion  ?Clavicle Bone Region Mild depletion  ?Clavicle and Acromion Bone Region Mild depletion  ?Scapular Bone Region No depletion  ?Dorsal Hand No depletion  ?Patellar Region Severe depletion  ?Anterior Thigh Region Severe depletion  ?Posterior Calf Region Moderate depletion  ?Edema (RD Assessment) None  ?Hair Reviewed  ?Eyes Reviewed  ?Mouth Reviewed  [poor dentition, lip trauma]  ?Skin Reviewed  ?Nails Reviewed  ? ?Diet Order:   ?Diet Order   ? ?  None  ? ?  ? ? ?EDUCATION NEEDS:  ?Not appropriate for education at this time ? ?Skin:  Skin Assessment: Reviewed RN Assessment (ecchymosis to the right shoulder) ? ?Last BM:  prior to admission ? ?Height:  ?Ht Readings from Last 1 Encounters:  ?09/05/21 5\' 6"  (1.676 m)  ? ? ?Weight:  ?Wt Readings from Last 1 Encounters:  ?09/09/21 69 kg  ? ? ?Ideal  Body Weight:  64.5 kg ? ?BMI:  Body mass index is 24.55 kg/m?. ? ?Estimated Nutritional Needs:  ?Kcal:  2100-2300 kcal/d ?Protein:  105-120 g/d ?Fluid:  2.1-2.3 L/d ? ? ?Ranell Patrick, RD, LDN ?Clinical Dietitian ?RD pager # available in [place]  ?After hours/weekend pager # available in [place] ?

## 2021-09-09 NOTE — Progress Notes (Signed)
CSW updated Joaine with [facility] that patient will need Medicaid for SNF placement since CIR is unable to accept the patient. Patient not found in the [facility] Medicaid system.  ? ?Cristobal Goldmann ?LCSW, MSW, MHA ?  ?

## 2021-09-09 NOTE — Progress Notes (Signed)
Inpatient Rehab Admissions Coordinator:  ? ?Pt. More alert today, family visiting.  So far, family has not confirmed they can take Pt. Home and provide care after CIR. Son that he lives with was not present and I have not yet been able to reach him. Family that was present report they think that pt. 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 ? ?Megan Salon, MS, CCC-SLP ?Rehab Admissions Coordinator  ?[phone] (celll) ?[phone] (office) ? ?

## 2021-09-09 NOTE — Progress Notes (Addendum)
Physical Therapy Treatment ?Patient Details ?Name: Joshua Price ?MRN: [id number] ?DOB: [birth year] ?Today's Date: 09/09/2021 ? ? ?History of Present Illness 60 yo male presents to [facility] on 2/25 with fall at home, + ETOH, BP 218/134. CT head reveals acute parenchymal hemorrhage involving left lentiform nucleus and  surrounding white matter; repeat CT shows interval increase in side of intraparenchymal hematoma centered in L BG, increased edema and 64mm L-to-R midline shift. PMH includes HTN, smoker, esophageal varices. ? ?  ?PT Comments  ? ? Pt was more awake today but still non-verbal. He continues to demonstrate significant pushing to the right and is inconsistently able overcome pushing. Pt stood 3 times with max +2 and seemed motivated to continue attempts at standing. During treatment session patient showed deficits in strength, endurance, activity tolerance, and cognition. Recommending therapy services at acute inpatient rehab to address the previously stated deficits. Will continue to follow acutely to maximize functional mobility, independence, and safety. ?  ?Recommendations for follow up therapy are one component of a multi-disciplinary discharge planning process, led by the attending physician.  Recommendations may be updated based on patient status, additional functional criteria and insurance authorization. ? ?Follow Up Recommendations ? Acute inpatient rehab (3hours/day) ?  ?  ?Assistance Recommended at Discharge Frequent or constant Supervision/Assistance  ?Patient can return home with the following Two people to help with walking and/or transfers;Two people to help with bathing/dressing/bathroom;Direct supervision/assist for medications management;Direct supervision/assist for financial management;Assist for transportation;Help with stairs or ramp for entrance;Assistance with cooking/housework;Assistance with feeding ?  ?Equipment Recommendations ? Other (comment) (TBD as pt progresses)  ?  ?Recommendations  for Other Services   ? ? ?  ?Precautions / Restrictions Precautions ?Precautions: Fall ?Precaution Comments: "pusher" to right at EOB ?Restrictions ?Weight Bearing Restrictions: No  ?  ? ?Mobility ? Bed Mobility ?Overal bed mobility: Needs Assistance ?Bed Mobility: Rolling, Supine to Sit, Sit to Supine ?Rolling: Total assist ?  ?Supine to sit: +2 for physical assistance, Max assist ?Sit to supine: +2 for physical assistance, Mod assist ?  ?General bed mobility comments: Pt was able to participate in bed mobility more today. ?  ? ?Transfers ?Overall transfer level: Needs assistance ?Equipment used: 2 person hand held assist ?Transfers: Sit to/from Stand ?Sit to Stand: Max assist, +2 physical assistance ?  ?  ?  ?  ?  ?General transfer comment: Pt was able to weightbear through his feet and get up off the bed. 3 attempts at standing. ?  ? ?Ambulation/Gait ?  ?  ?  ?  ?  ?  ?  ?  ? ? ?Stairs ?  ?  ?  ?  ?  ? ? ?Wheelchair Mobility ?  ? ?Modified Rankin (Stroke Patients Only) ?Modified Rankin (Stroke Patients Only) ?Pre-Morbid Rankin Score: No significant disability ?Modified Rankin: Severe disability ? ? ?  ?Balance Overall balance assessment: Needs assistance ?Sitting-balance support: Single extremity supported, Feet supported ?Sitting balance-Leahy Scale: Zero ?Sitting balance - Comments: Heavy pushing towards R, was able to bear weight through his R elbow. Pt was able to self correct occasionally with multimodal cuing and occasionally without. When support was provided pt would occasionlly lean harder into the support. Pt was able to progress to inconsistently sitting with supervision assist at times throughout session. ?Postural control: Posterior lean, Right lateral lean ?Standing balance support: Bilateral upper extremity supported ?Standing balance-Leahy Scale: Zero ?Standing balance comment: Max +2 sit to stand from bed x2 (this did not help with his pushing when he  sat back down). Able to stand more upright  with assistance. ?  ?  ?  ?  ?  ?  ?  ?  ?  ?  ?  ?  ? ?  ?Cognition Arousal/Alertness: Lethargic ?Behavior During Therapy: Flat affect ?Overall Cognitive Status: Difficult to assess ?Area of Impairment: Following commands, Safety/judgement, Awareness, Problem solving, Attention ?  ?  ?  ?  ?  ?  ?  ?  ?  ?Current Attention Level: Focused ?  ?Following Commands: Follows one step commands inconsistently ?Safety/Judgement: Decreased awareness of safety, Decreased awareness of deficits ?Awareness: Intellectual ?Problem Solving: Slow processing, Decreased initiation, Difficulty sequencing, Requires verbal cues, Requires tactile cues ?General Comments: Cognition was difficult to assess due to aphasia. Pt has possible apraxia, difficult to assess. Pt was asked about days of the week and could not come up with the day when showing him a sign of the day of the week. ?  ?  ? ?  ?Exercises   ? ?  ?General Comments General comments (skin integrity, edema, etc.): Pt's vitals were monitored during session and BP was less than 160 ?  ?  ? ?Pertinent Vitals/Pain Pain Assessment ?Pain Assessment: No/denies pain ?Pain Location: Pt was non verbal during todays session but he shook his head "no" when asked about pain.  ? ? ?Home Living   ?  ?  ?  ?  ?  ?  ?  ?  ?  ?   ?  ?Prior Function    ?  ?  ?   ? ?PT Goals (current goals can now be found in the care plan section)   ? ?  ?Frequency ? ? ? Min 4X/week ? ? ? ?  ?PT Plan Current plan remains appropriate  ? ? ?Co-evaluation   ?  ?  ?  ?  ? ?  ?AM-PAC PT "6 Clicks" Mobility   ?Outcome Measure ? Help needed turning from your back to your side while in a flat bed without using bedrails?: A Lot ?Help needed moving from lying on your back to sitting on the side of a flat bed without using bedrails?: A Lot ?Help needed moving to and from a bed to a chair (including a wheelchair)?: A Lot ?Help needed standing up from a chair using your arms (e.g., wheelchair or bedside chair)?: A Lot ?Help  needed to walk in hospital room?: Total ?Help needed climbing 3-5 steps with a railing? : Total ?6 Click Score: 10 ? ?  ?End of Session Equipment Utilized During Treatment: Gait belt ?Activity Tolerance: Patient limited by fatigue ?Patient left: in bed;with call bell/phone within reach;with bed alarm set;with restraints reapplied ?Nurse Communication: Mobility status ?PT Visit Diagnosis: Other abnormalities of gait and mobility (R26.89);Muscle weakness (generalized) (M62.81);Other symptoms and signs involving the nervous system (R29.898);Hemiplegia and hemiparesis ?Hemiplegia - Right/Left: Right ?Hemiplegia - dominant/non-dominant: Dominant ?Hemiplegia - caused by: Nontraumatic intracerebral hemorrhage ?  ? ? ?Time: 5374-8270 ?PT Time Calculation (min) (ACUTE ONLY): 25 min ? ?Charges:  $Therapeutic Activity: 8-22 mins ?$Neuromuscular Re-education: 8-22 mins          ?          ? ?Armanda Heritage, SPT ? ? ? ?Armanda Heritage ?09/09/2021, 5:57 PM ? ?

## 2021-09-09 NOTE — Progress Notes (Signed)
Non violent restraint order for bilateral wrists. Patient only in left wrist restraint all night. Right wrist restraint not needed.  ?

## 2021-09-09 NOTE — Progress Notes (Addendum)
STROKE TEAM PROGRESS NOTE  ? ?INTERVAL HISTORY ?Not answering questions appropriately, nods yes to everything. PRN hydralazine added. Agitation overnight, pulled out NG tube, cortrak to be placed today.  P.o. meds given late due to lack of oral access. ? ?Vitals:  ? 09/09/21 0930 09/09/21 1000 09/09/21 1030 09/09/21 1147  ?BP: (!) 146/89 (!) 148/94 (!) 134/94   ?Pulse: 93 97 92   ?Resp: 15  17   ?Temp:    98.2 ?F (36.8 ?C)  ?TempSrc:    Axillary  ?SpO2: 96% 96% 94%   ?Weight:      ?Height:      ? ?CBC:  ?Recent Labs  ?Lab 09/05/21 ?1820 09/05/21 ?1904 09/08/21 ?0615 09/09/21 ?0730  ?WBC 4.3   < > 5.4 5.8  ?NEUTROABS 1.7  --   --   --   ?HGB 12.9*   < > 13.8 14.9  ?HCT 38.0*   < > 41.3 42.2  ?MCV 95.5   < > 98.6 100.2*  ?PLT 112*   < > 118* 121*  ? < > = values in this interval not displayed.  ? ?Basic Metabolic Panel:  ?Recent Labs  ?Lab 09/07/21 ?0751 09/07/21 ?1250 09/08/21 ?0615 09/08/21 ?1132 09/09/21 ?0730 09/09/21 ?1150  ?NA 145   < > 154*   < > 161* 160*  ?K 3.3*  --  3.2*  --  3.0*  --   ?CL 110  --  119*  --  128*  --   ?CO2 26  --  24  --  22  --   ?GLUCOSE 142*  --  147*  --  125*  --   ?BUN 6  --  <5*  --  6  --   ?CREATININE 0.87  --  0.97  --  1.00  --   ?CALCIUM 8.8*  --  9.2  --  9.2  --   ?MG 2.2  --   --   --   --   --   ?PHOS 2.4*  --  2.6  --   --   --   ? < > = values in this interval not displayed.  ? ?Lipid Panel:  ?Recent Labs  ?Lab 09/07/21 ?0100 09/08/21 ?0615  ?CHOL 255*  --   ?TRIG 678* 287*  ?HDL 34*  --   ?CHOLHDL 7.5  --   ?VLDL UNABLE TO CALCULATE IF TRIGLYCERIDE OVER 400 mg/dL  --   ?LDLCALC UNABLE TO CALCULATE IF TRIGLYCERIDE OVER 400 mg/dL  --   ? ?HgbA1c:  ?Recent Labs  ?Lab 09/07/21 ?0100  ?HGBA1C 5.3  ? ?Urine Drug Screen:  ?Recent Labs  ?Lab 09/05/21 ?1820  ?LABOPIA NONE DETECTED  ?COCAINSCRNUR NONE DETECTED  ?LABBENZ NONE DETECTED  ?AMPHETMU NONE DETECTED  ?THCU NONE DETECTED  ?LABBARB NONE DETECTED  ?  ?Alcohol Level  ?Recent Labs  ?Lab 09/05/21 ?1755  ?ETH 206*  ? ? ?IMAGING  past 24 hours ?DG Abd Portable 1V ? ?Result Date: 09/09/2021 ?CLINICAL DATA:  Feeding tube placement. EXAM: PORTABLE ABDOMEN - 1 VIEW COMPARISON:  09/08/2021 FINDINGS: The previous enteric tube has been removed. A feeding tube has been placed with tip projecting in the right upper quadrant in the region of the gastric pylorus. No dilated loops of bowel are seen to suggest obstruction. No acute osseous abnormality is seen. IMPRESSION: Feeding tube terminating over the distal stomach. Electronically Signed   By: Logan Bores M.D.   On: 09/09/2021 12:50   ? ?PHYSICAL EXAM ? ?Physical  Exam  ?Constitutional: Appears well-developed and well-nourished.  ?Cardiovascular: Normal rate and regular rhythm.  ?Respiratory: Effort normal, non-labored breathing ? ?Abdomen is distended. ? ?Neuro: ?Mental Status: ?Patient is very drowsy, did not speak during exam. Responds to name ?Not following commands today but will move all extremities to noxious stimuli ?Cranial Nerves: ?II:  Pupils are equal, round, and reactive to light.   ?III,IV, VI: EOMI without ptosis or diploplia.  ?XI: Head is midline ?XII: Does not protrude tongue ?Motor: ?Tone is normal. Bulk is normal.  ?LUE, LLE 4/5 ?RUE, RLE 0/5  ?Right hemiplegia, Left antigravity ?Sensory: ?Sensation is symmetric to light touch in the arms and legs. No extinction to DSS present.  ?Cerebellar: ?Tremor noted bilaterally  ? ?ASSESSMENT/PLAN ?[name] is a 61 y.o. male with history of HTN, smoking, esophageal varcies presenting after he was found down on the concrete porch of his home.  He has been on the phone speaking with a relative and she heard him fall and he was able to state that he was on the ground and could not get up family member stated that this happened at 1645 on 2/25 and the son was able to get there within 5 minutes, he then called EMS.  On arrival he had decreased alertness, right hemineglect, dysarthric speech.  Hypertonic saline initiated for cerebral edema  with mass effect. Hypertonic saline increased to 72ml/hr, most recent sodium 155. Repeat head CT performed yesterday afternoon reveals unchanged IPH.  CIR signed off.  Will need long-term care placement. ? 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 Mild mass effect. ?Repeat CT- Interval increase in the size of the intraparenchymal hematoma centered in the left basal ganglia, with increased associated edema and 4 mm of left-to-right midline shift. ?MRI  Redemonstrated intraparenchymal hematoma centered in the left basal ganglia, which appears to have increased in size compared to the study  6 p.m. on 09/05/2021. There is slightly increasing mass effect on the left lateral ventricle and 4 mm of left-to-right midline shift. ?Repeat head CT 2/27 stable IPH and stable midline shift ?2D Echo EF [id number], grade 1 diastolic dysfunction ?VTE prophylaxis -SCDs ?No antithrombotic prior to admission, now on No antithrombotic. IPH ?Therapy recommendations:  SNF ?Disposition:  Pending ? ?Cerebral edema with brain compression ?increased mass effect on left lateral ventricle ?Hypertonic saline increased to 54ml/hr ?Na- 138-> 140 -> 145 ->155 -> 159 ? ?Hypertension ?Home meds:  Norvasc 5mg , increased Norvasc 10mg  ?Stable ?Keep SBP <160 ?Long-term BP goal normotensive ? ?Other Stroke Risk Factors ?ETOH use, alcohol level 206, advised to drink no more than 2 drink(s) a day ?Cigarette smoker, advised to quit smoking ?.25ppd ? ?Other Active Problems ?Alcohol withdrawal - CIWA protocol initiated  ?Ativan 1-2mg  prn ?Hypokalemia. K3.0.  Replaced this am. ? ?Fever ?Febrile overnight to 100.7 ?CXR shows no new infiltrates or consolidation ?Blood cultures drawn ? ?Hospital day # 4 ? ?Patient seen and examined by NP/APP with MD. MD to update note as needed.  ? ?Janine Ores, DNP,  FNP-BC ?[facility] ?Pager: [phone] ? ?ATTENDING ATTESTATION: ?61 year old gentleman history hypertension smoking, alcohol use  found down with  left basal ganglia hemorrhage   On CIWA protocol with Ativan for his agitation.   He continues to be poorly responsive and not following commands.  He attempts to speak but it is unintelligible.  Abdomen slightly distended.  NG pulled  out yesterday core track replaced today.  MiraLAX to help with abdominal distention.  No built bowel movement in the past 4 days.  Patient will need CIR and long-term care.  Family is not able to provide care at home.  Sodium 161 titrate down hypertonic saline.  No more fevers.  Blood cultures with no growth. ?  ?Dr. Reeves Forth evaluated pt independently, reviewed imaging, chart, labs. Discussed and formulated plan with the APP. Please see APP note above for details.    ?  ?This patient is critically ill due to respiratory distress, intracranial hemorrhage with edema on hypertonic saline and at significant risk of neurological worsening, death form heart failure, respiratory failure, recurrent stroke, bleeding from [facility], seizure, sepsis. This patient's care requires constant monitoring of vital signs, hemodynamics, respiratory and cardiac monitoring, review of multiple databases, neurological assessment, discussion with family, other specialists and medical decision making of high complexity. I spent 35 minutes of neurocritical care time in the care of this patient.  ?  ?Aliceson Dolbow,MD  ? ? ?To contact Stroke Continuity provider, please refer to [email or web address] ?After hours, contact General Neurology ? ?

## 2021-09-09 NOTE — Progress Notes (Signed)
Modified Barium Swallow Progress Note ? ?Patient Details  ?Name: Joshua Price ?MRN: [id number] ?Date of Birth: [birth year] ? ?Today's Date: 09/09/2021 ? ?Modified Barium Swallow completed.  Full report located under Chart Review in the Imaging Section. ? ?Brief recommendations include the following: ? ?Clinical Impression ? The patient was seen for inpatient MBS. Patient presents with a primarily moderate oropharyngeal dysphagia with a moderate risk for aspiration. Bolus preparation and mastication significantly impaired possibly 2/2 inadeaute/missing dentition. Patient could not chew or lateralize regular textures for MBS. Oral phase of swallowing impaired and characterized by decreased labial seal, premature oral spillage, open-mouth posture, poor lateralization/propulsion of bolus, and decreased BOT to palate contact. Initiation of pharyngeal swallow occurred at the level of the pyriform sinuses with thin liquids indicating a delay in swallow initiation. NTL showed slight improvement in patient's ability to control flow rate and initiate pharyngeal swallow sequence. Epiglottic inversion and laryngeal vestibule closure present. Neither penetration or aspiration noted during today's study, however patient's cognitive deficits, impulsivity, and poor awareness place him at a moderate aspiration risk. SLP recommends the patient receive Dysphagia 1 diet with NTL. Medications may be crushed in puree. Continue to incorporate oral care regimen. ?  ?Swallow Evaluation Recommendations ? ?   ? ? SLP Diet Recommendations: Dysphagia 1 (Puree) solids;Nectar thick liquid ? ? Liquid Administration via: Cup ? ? Medication Administration: Crushed with puree ? ? Supervision: Full assist for feeding ? ? Compensations: Minimize environmental distractions;Slow rate;Small sips/bites;Follow solids with liquid ? ? Postural Changes: Remain semi-upright after after feeds/meals (Comment);Seated upright at 90 degrees ? ? Oral Care  Recommendations: Oral care QID ? ?   ? ? ? ?Ezekiel Slocumb ?09/09/2021,2:30 PM ?

## 2021-09-09 NOTE — Procedures (Signed)
Cortrak ? ?Tube Type:  Cortrak - 43 inches ?Tube Location:  Right nare ?Initial Placement:  Stomach ?Secured by: Dorann Lodge ?Technique Used to Measure Tube Placement:  Marking at nare/corner of mouth ?Cortrak Secured At:  68 cm ? ?Cortrak Tube Team Note: ? ?Consult received to place a Cortrak feeding tube.  ? ?X-ray is required, abdominal x-ray has been ordered by the Cortrak team. Please confirm tube placement before using the Cortrak tube.  ? ?If the tube becomes dislodged please keep the tube and contact the Cortrak team at [email or web address] (password [email or web address]) for replacement.  ?If after hours and replacement cannot be delayed, place a NG tube and confirm placement with an abdominal x-ray.  ? ? ?Koleen Distance MS, RD, LDN ?Please refer to [facility] for RD and/or RD on-call/weekend/after hours pager ? ? ?

## 2021-09-10 ENCOUNTER — Inpatient Hospital Stay (HOSPITAL_COMMUNITY): Payer: Medicaid Other

## 2021-09-10 LAB — GLUCOSE, CAPILLARY
Glucose-Capillary: 124 mg/dL — ABNORMAL HIGH (ref 70–99)
Glucose-Capillary: 136 mg/dL — ABNORMAL HIGH (ref 70–99)
Glucose-Capillary: 168 mg/dL — ABNORMAL HIGH (ref 70–99)
Glucose-Capillary: 115 mg/dL — ABNORMAL HIGH (ref 70–99)
Glucose-Capillary: 204 mg/dL — ABNORMAL HIGH (ref 70–99)
Glucose-Capillary: 135 mg/dL — ABNORMAL HIGH (ref 70–99)

## 2021-09-10 LAB — BASIC METABOLIC PANEL
Anion gap: 10 (ref 5–15)
BUN: 13 mg/dL (ref 6–20)
CO2: 23 mmol/L (ref 22–32)
Calcium: 9.5 mg/dL (ref 8.9–10.3)
Chloride: 130 mmol/L — ABNORMAL HIGH (ref 98–111)
Creatinine, Ser: 1.33 mg/dL — ABNORMAL HIGH (ref 0.61–1.24)
GFR, Estimated: >60 mL/min (ref >60)
Glucose, Bld: 169 mg/dL — ABNORMAL HIGH (ref 70–99)
Potassium: 3.2 mmol/L — ABNORMAL LOW (ref 3.5–5.1)
Sodium: 163 mmol/L (ref 135–145)

## 2021-09-10 LAB — SODIUM
Sodium: 162 mmol/L (ref 135–145)
Sodium: 166 mmol/L (ref 135–145)

## 2021-09-10 LAB — PHOSPHORUS
Phosphorus: 4.2 mg/dL (ref 2.5–4.6)
Phosphorus: 3.7 mg/dL (ref 2.5–4.6)

## 2021-09-10 LAB — VITAMIN D 25 HYDROXY (VIT D DEFICIENCY, FRACTURES)

## 2021-09-10 LAB — MAGNESIUM
Magnesium: 2.2 mg/dL (ref 1.7–2.4)
Magnesium: 2.1 mg/dL (ref 1.7–2.4)

## 2021-09-10 LAB — ZINC: Zinc: 60 ug/dL (ref 44–115)

## 2021-09-10 MED ORDER — VITAMIN D (ERGOCALCIFEROL) 1.25 MG (50000 UNIT) PO CAPS: 50000.0000 [IU] | ORAL_CAPSULE | Freq: Every day | ORAL | Status: DC

## 2021-09-10 MED ORDER — VITAMIN D (ERGOCALCIFEROL) 1.25 MG (50000 UNIT) PO CAPS
50000.0000 [IU] | ORAL_CAPSULE | ORAL | Status: DC
  Filled 2021-09-10: qty 1

## 2021-09-10 MED ORDER — INSULIN ASPART 100 UNIT/ML IJ SOLN
0.0000 [IU] | INTRAMUSCULAR | Status: DC
  Administered 2021-09-10: 2 [IU] via SUBCUTANEOUS
  Administered 2021-09-10: 3 [IU] via SUBCUTANEOUS
  Administered 2021-09-10: 2 [IU] via SUBCUTANEOUS
  Administered 2021-09-10: 5 [IU] via SUBCUTANEOUS
  Administered 2021-09-10: 3 [IU] via SUBCUTANEOUS
  Administered 2021-09-10 – 2021-09-11 (×2): 2 [IU] via SUBCUTANEOUS
  Administered 2021-09-11 (×4): 3 [IU] via SUBCUTANEOUS
  Administered 2021-09-12: 5 [IU] via SUBCUTANEOUS
  Administered 2021-09-12: 2 [IU] via SUBCUTANEOUS
  Administered 2021-09-12 (×2): 3 [IU] via SUBCUTANEOUS
  Administered 2021-09-13: 8 [IU] via SUBCUTANEOUS
  Administered 2021-09-13 (×2): 5 [IU] via SUBCUTANEOUS
  Administered 2021-09-13 (×2): 3 [IU] via SUBCUTANEOUS
  Administered 2021-09-14 (×2): 2 [IU] via SUBCUTANEOUS
  Administered 2021-09-14 (×2): 5 [IU] via SUBCUTANEOUS
  Administered 2021-09-14: 2 [IU] via SUBCUTANEOUS
  Administered 2021-09-14 – 2021-09-15 (×3): 5 [IU] via SUBCUTANEOUS
  Administered 2021-09-15: 8 [IU] via SUBCUTANEOUS
  Administered 2021-09-15 (×2): 5 [IU] via SUBCUTANEOUS
  Administered 2021-09-16: 11 [IU] via SUBCUTANEOUS
  Administered 2021-09-16: 8 [IU] via SUBCUTANEOUS
  Administered 2021-09-16 (×2): 11 [IU] via SUBCUTANEOUS
  Administered 2021-09-16 – 2021-09-17 (×3): 15 [IU] via SUBCUTANEOUS
  Administered 2021-09-17 (×2): 11 [IU] via SUBCUTANEOUS
  Administered 2021-09-17: 18:00:00 15 [IU] via SUBCUTANEOUS
  Administered 2021-09-17: 09:00:00 5 [IU] via SUBCUTANEOUS
  Administered 2021-09-17: 23:00:00 11 [IU] via SUBCUTANEOUS
  Administered 2021-09-18: 3 [IU] via SUBCUTANEOUS

## 2021-09-10 MED ORDER — THIAMINE HCL 100 MG PO TABS
500.0000 mg | ORAL_TABLET | Freq: Three times a day (TID) | ORAL | Status: AC
  Administered 2021-09-10 – 2021-09-13 (×6): 500 mg
  Filled 2021-09-10 (×6): qty 5

## 2021-09-10 MED ORDER — INSULIN ASPART 100 UNIT/ML IJ SOLN: 0.0000 [IU] | Freq: Three times a day (TID) | INTRAMUSCULAR | Status: DC

## 2021-09-10 MED ORDER — FREE WATER
500.0000 mL | Status: DC
  Administered 2021-09-10 – 2021-09-11 (×4): 500 mL

## 2021-09-10 MED ORDER — INSULIN ASPART 100 UNIT/ML IJ SOLN: 0.0000 [IU] | INTRAMUSCULAR | Status: DC

## 2021-09-10 MED ORDER — FREE WATER
200.0000 mL | Status: DC
  Administered 2021-09-10 (×2): 200 mL

## 2021-09-10 MED ORDER — SODIUM CHLORIDE 0.9 % IV SOLN: INTRAVENOUS | Status: DC

## 2021-09-10 MED ORDER — POTASSIUM CHLORIDE 10 MEQ/100ML IV SOLN
10.0000 meq | INTRAVENOUS | Status: AC
  Administered 2021-09-10 (×2): 10 meq via INTRAVENOUS
  Filled 2021-09-10: qty 100

## 2021-09-10 MED ORDER — THIAMINE HCL 100 MG PO TABS
500.0000 mg | ORAL_TABLET | Freq: Three times a day (TID) | ORAL | Status: DC
Start: 2021-09-10 — End: 2021-09-10
  Administered 2021-09-10: 500 mg via ORAL
  Filled 2021-09-10: qty 5

## 2021-09-10 MED ORDER — POTASSIUM CHLORIDE 20 MEQ PO PACK
20.0000 meq | PACK | Freq: Two times a day (BID) | ORAL | Status: DC
Start: 2021-09-10 — End: 2021-09-11
  Administered 2021-09-10 (×2): 20 meq via ORAL
  Filled 2021-09-10 (×2): qty 1

## 2021-09-10 NOTE — Progress Notes (Signed)
Date and time results received: 09/10/21 0635 ?(use smartphrase ".now" to insert current time) ? ?Test: Sodium ?Critical Value: 162 ? ?Name of Provider Notified: Dr. Theda Sers ? ?Orders Received? Or Actions Taken?:  ?See new orders.  ?

## 2021-09-10 NOTE — Progress Notes (Signed)
Physical Therapy Treatment ?Patient Details ?Name: Joshua Price ?MRN: [id number] ?DOB: 1961-01-20 ?Today's Date: 09/10/2021 ? ? ?History of Present Illness 61 yo male presents to [facility] on 2/25 with fall at home, + ETOH, BP 218/134. CT head reveals acute parenchymal hemorrhage involving left lentiform nucleus and  surrounding white matter; repeat CT shows interval increase in side of intraparenchymal hematoma centered in L BG, increased edema and 31mm L-to-R midline shift. PMH includes HTN, smoker, esophageal varices. ? ?  ?PT Comments  ? ? Pt demonstrates the ability to follow commands with left side but requires significant assistance to mobilize due to flaccid R side and impaired core strength. Pt appears frustrated during session, with decreased interest in participation with PT after initial attempt at standing. PT provides encouragement for continued attempts at producing AROM of R side with visual attention to the desired limb of mobility. PT will continue to follow in an effort to progress mobility and reduce falls risk.   ?Recommendations for follow up therapy are one component of a multi-disciplinary discharge planning process, led by the attending physician.  Recommendations may be updated based on patient status, additional functional criteria and insurance authorization. ? ?Follow Up Recommendations ? Acute inpatient rehab (3hours/day) ?  ?  ?Assistance Recommended at Discharge Frequent or constant Supervision/Assistance  ?Patient can return home with the following Two people to help with walking and/or transfers;Two people to help with bathing/dressing/bathroom;Direct supervision/assist for medications management;Direct supervision/assist for financial management;Assist for transportation;Help with stairs or ramp for entrance;Assistance with cooking/housework;Assistance with feeding ?  ?Equipment Recommendations ? Wheelchair (measurements PT);Wheelchair cushion (measurements PT);BSC/3in1;Other (comment)  (hemi-walker)  ?  ?Recommendations for Other Services   ? ? ?  ?Precautions / Restrictions Precautions ?Precautions: Fall ?Precaution Comments: no pushing noted this session ?Restrictions ?Weight Bearing Restrictions: No  ?  ? ?Mobility ? Bed Mobility ?Overal bed mobility: Needs Assistance ?Bed Mobility: Supine to Sit, Sit to Supine, Rolling ?Rolling: Mod assist ?  ?Supine to sit: Mod assist ?Sit to supine: Mod assist ?  ?General bed mobility comments: assist with RLE and hand hold to pull into upright. Pt lays trunk down impulsively to return to supine, PT assists in repositioning LEs ?  ? ?Transfers ?Overall transfer level: Needs assistance ?Equipment used: 1 person hand held assist ?Transfers: Sit to/from Stand ?Sit to Stand: Max assist ?  ?  ?  ?  ?  ?General transfer comment: pt holding onto railing on left despite PT cues for hand hold. Pt with left foot sliding laterally, returned to sitting position. Pt refuses further attempts at standing ?  ? ?Ambulation/Gait ?  ?  ?  ?  ?  ?  ?  ?  ? ? ?Stairs ?  ?  ?  ?  ?  ? ? ?Wheelchair Mobility ?  ? ?Modified Rankin (Stroke Patients Only) ?Modified Rankin (Stroke Patients Only) ?Pre-Morbid Rankin Score: No significant disability ?Modified Rankin: Severe disability ? ? ?  ?Balance Overall balance assessment: Needs assistance ?Sitting-balance support: Single extremity supported, No upper extremity supported, Feet supported ?Sitting balance-Leahy Scale: Poor ?Sitting balance - Comments: minA, brief periods of modA ?Postural control: Posterior lean ?Standing balance support: Single extremity supported ?Standing balance-Leahy Scale: Zero ?Standing balance comment: maxA ?  ?  ?  ?  ?  ?  ?  ?  ?  ?  ?  ?  ? ?  ?Cognition Arousal/Alertness: Awake/alert ?Behavior During Therapy: Flat affect (appears frustrated during session) ?Overall Cognitive Status: Difficult to assess ?Area of  Impairment: Attention, Following commands ?  ?  ?  ?  ?  ?  ?  ?  ?  ?Current Attention Level:  Focused ?  ?Following Commands: Follows one step commands inconsistently (follows commands well with L side initially, seems to lose interest in therapy near end of session) ?  ?  ?  ?  ?  ?  ? ?  ?Exercises Other Exercises ?Other Exercises: PROM R ankle, knee, hip flexion/extension as well as R hip abduction/adduction. 10 reps. ?Other Exercises: PROM R shoulder flexion/extension, abduction. Elbow flex/ext. 10 reps. ? ?  ?General Comments General comments (skin integrity, edema, etc.): VSS on RA ?  ?  ? ?Pertinent Vitals/Pain Pain Assessment ?Pain Assessment: No/denies pain  ? ? ?Home Living   ?  ?  ?  ?  ?  ?  ?  ?  ?  ?   ?  ?Prior Function    ?  ?  ?   ? ?PT Goals (current goals can now be found in the care plan section) Acute Rehab PT Goals ?Patient Stated Goal: PT unable to understand pt speech ?Progress towards PT goals: Progressing toward goals ? ?  ?Frequency ? ? ? Min 4X/week ? ? ? ?  ?PT Plan Current plan remains appropriate  ? ? ?Co-evaluation   ?  ?  ?  ?  ? ?  ?AM-PAC PT "6 Clicks" Mobility   ?Outcome Measure ? Help needed turning from your back to your side while in a flat bed without using bedrails?: A Lot ?Help needed moving from lying on your back to sitting on the side of a flat bed without using bedrails?: A Lot ?Help needed moving to and from a bed to a chair (including a wheelchair)?: Total ?Help needed standing up from a chair using your arms (e.g., wheelchair or bedside chair)?: A Lot ?Help needed to walk in hospital room?: Total ?Help needed climbing 3-5 steps with a railing? : Total ?6 Click Score: 9 ? ?  ?End of Session   ?Activity Tolerance: Other (comment) (pt seems to be limited by frustation, difficult to assess due to communication barriers) ?Patient left: in bed;with call bell/phone within reach;with bed alarm set ?Nurse Communication: Mobility status ?PT Visit Diagnosis: Other abnormalities of gait and mobility (R26.89);Muscle weakness (generalized) (M62.81);Other symptoms and signs  involving the nervous system (R29.898);Hemiplegia and hemiparesis ?Hemiplegia - Right/Left: Right ?Hemiplegia - dominant/non-dominant: Dominant ?Hemiplegia - caused by: Nontraumatic intracerebral hemorrhage ?  ? ? ?Time: 2979-8921 ?PT Time Calculation (min) (ACUTE ONLY): 24 min ? ?Charges:  $Therapeutic Exercise: 8-22 mins ?$Therapeutic Activity: 8-22 mins          ?          ? ?Arlyss Gandy, PT, DPT ?Acute Rehabilitation ?Pager: [phone] ?Office [phone] ? ? ? ?Arlyss Gandy ?09/10/2021, 3:54 PM ? ?

## 2021-09-10 NOTE — Progress Notes (Signed)
Nutrition Follow-up ? ?DOCUMENTATION CODES:  ? ?Non-severe (moderate) malnutrition in context of social or environmental circumstances ? ?INTERVENTION:  ? ?Vital 1.5 at 60 ml/h (1440 ml per day). Continue to advance by 69m q6h to goal rate. ?Prosource TF 45 ml 1x/d to provide 40kcal and 11g of protein ? ?Regimen provides 2200 kcal, 108 gm protein, 1280 ml free water daily (TF+flush) ? ?200 ml free water every 4 hours  ?Total free water: 2480 ml  ? ?MVI with minerals daily  ?Refeeding labs being monitored.  ? ?Vitamin D 6.9 - Recommend 50,000 IU weekly x 8 weeks messaged NP - ok to order ? ?NUTRITION DIAGNOSIS:  ? ?Moderate Malnutrition related to social / environmental circumstances (EtOH abuse) as evidenced by mild fat depletion, moderate fat depletion, mild muscle depletion, severe muscle depletion. ?Ongoing.  ? ?GOAL:  ? ?Patient will meet greater than or equal to 90% of their needs ?Met with TF at goal.  ? ?MONITOR:  ? ?PO intake, TF tolerance, Labs ? ?REASON FOR ASSESSMENT:  ? ?Other (Comment) (new cortrak) ?  ? ?ASSESSMENT:  ? ?Pt with a history of HTN, tobacco abuse (.25 pack/d), EtOH abuse, and esophageal varices presented to ED as a code stroke after being found down at home. In ED, pt in hypertensive crisis and imaging showing an acute left basal ganglia hemorrhage. ? ?Pt discussed during ICU rounds and with RN.  ?Pt opens eyes but nonverbal. ?Per RN pt ate 5 bites of food for breakfast.  ?Cortrak tube remains in place to meet nutrition needs.  ? ?Medications reviewed and include: folic acid, SSI, MVI with minerals, protonix, miralax, KCl 20 mEq BID, senokot-s, thiamine  ?NS @ 75 ml/hr ?Cleviprex off ?IV KCl x 2 ? ?Labs reviewed: Na 163 (hypertonic saline), K: 3.2 ?Folate: 22.5 ?Vitamin B12: 11552?Vitamin D: 6.9 ?CBG's: 124-168 ? ? ?Diet Order:   ?Diet Order   ? ?       ?  DIET - DYS 1 Room service appropriate? Yes; Fluid consistency: Nectar Thick  Diet effective now       ?  ? ?  ?  ? ?  ? ? ?EDUCATION  NEEDS:  ? ?Not appropriate for education at this time ? ?Skin:  Skin Assessment: Reviewed RN Assessment (ecchymosis to the right shoulder) ? ?Last BM:  3/1 medium ? ?Height:  ? ?Ht Readings from Last 1 Encounters:  ?09/05/21 5' 6"  (1.676 m)  ? ? ?Weight:  ? ?Wt Readings from Last 1 Encounters:  ?09/10/21 70.9 kg  ? ? ?Ideal Body Weight:  64.5 kg ? ?BMI:  Body mass index is 25.23 kg/m?. ? ?Estimated Nutritional Needs:  ? ?Kcal:  2100-2300 kcal/d ? ?Protein:  105-120 g/d ? ?Fluid:  2.1-2.3 L/d ? ?HLockie Pares, RD, LDN, CNSC ?See AMiON for contact information  ? ?

## 2021-09-10 NOTE — Progress Notes (Addendum)
STROKE TEAM PROGRESS NOTE   INTERVAL HISTORY Cortrak placed yesterday, opens eyes to voice. Ate a couple bites of food. Plan to transfer out of ICU after follow up scan.  Free water 200ml q4hr and NS 4275ml/hr Add vit D per dietary recommendation  Vitals:   09/10/21 0700 09/10/21 0800 09/10/21 0900 09/10/21 1000  BP: 137/82 (!) 146/96 (!) 143/83 (!) 151/95  Pulse: 74 83 81 89  Resp: 14 16 18 17   Temp:  98.1 F (36.7 C)    TempSrc:  Oral    SpO2: 96% 94% 96% 94%  Weight:      Height:       CBC:  Recent Labs  Lab 09/05/21 1820 09/05/21 1904 09/08/21 0615 09/09/21 0730  WBC 4.3   < > 5.4 5.8  NEUTROABS 1.7  --   --   --   HGB 12.9*   < > 13.8 14.9  HCT 38.0*   < > 41.3 42.2  MCV 95.5   < > 98.6 100.2*  PLT 112*   < > 118* 121*   < > = values in this interval not displayed.    Basic Metabolic Panel:  Recent Labs  Lab 09/08/21 0615 09/08/21 1132 09/09/21 0730 09/09/21 1150 09/09/21 1828 09/10/21 0511  NA 154*   < > 161*   < > 160* 162*  K 3.2*  --  3.0*  --   --   --   CL 119*  --  128*  --   --   --   CO2 24  --  22  --   --   --   GLUCOSE 147*  --  125*  --   --   --   BUN <5*  --  6  --   --   --   CREATININE 0.97  --  1.00  --   --   --   CALCIUM 9.2  --  9.2  --   --   --   MG  --   --   --   --  1.9 2.2  PHOS 2.6  --   --   --  4.3 4.2   < > = values in this interval not displayed.    Lipid Panel:  Recent Labs  Lab 09/07/21 0100 09/08/21 0615  CHOL 255*  --   TRIG 678* 287*  HDL 34*  --   CHOLHDL 7.5  --   VLDL UNABLE TO CALCULATE IF TRIGLYCERIDE OVER 400 mg/dL  --   LDLCALC UNABLE TO CALCULATE IF TRIGLYCERIDE OVER 400 mg/dL  --     [id number]:  Recent Labs  Lab 09/07/21 0100  HGBA1C 5.3    Urine Drug Screen:  Recent Labs  Lab 09/05/21 1820  LABOPIA NONE DETECTED  COCAINSCRNUR NONE DETECTED  LABBENZ NONE DETECTED  AMPHETMU NONE DETECTED  THCU NONE DETECTED  LABBARB NONE DETECTED     Alcohol Level  Recent Labs  Lab 09/05/21 1755   ETH 206*     IMAGING past 24 hours DG Abd Portable 1V  Result Date: 09/09/2021 CLINICAL DATA:  Feeding tube placement. EXAM: PORTABLE ABDOMEN - 1 VIEW COMPARISON:  09/08/2021 FINDINGS: The previous enteric tube has been removed. A feeding tube has been placed with tip projecting in the right upper quadrant in the region of the gastric pylorus. No dilated loops of bowel are seen to suggest obstruction. No acute osseous abnormality is seen. IMPRESSION: Feeding tube terminating over the distal stomach. Electronically  Signed   By: Sebastian Ache M.D.   On: 09/09/2021 12:50   DG Swallowing Func-Speech Pathology  Result Date: 09/09/2021 Table formatting from the original result was not included. Objective Swallowing Evaluation: Type of Study: MBS-Modified Barium Swallow Study  Completed and documented by Ezekiel Slocumb, SLP Student Supervised and reviewed by Harlon Ditty MA CCC-SLP Patient Details Name: Berlie Gayle MRN: [id number] Date of Birth: [birth year] Today's Date: 09/09/2021 Time: SLP Start Time (ACUTE ONLY): 1340 -SLP Stop Time (ACUTE ONLY): 1400 SLP Time Calculation (min) (ACUTE ONLY): 20 min Past Medical History: Past Medical History: Diagnosis Date  Esophageal varices (HCC)  Past Surgical History: No past surgical history on file. HPI: Patient is a 61 y.o. male with PMH: HTN, tobacco use, esophageal varices who presented to ED via EMS on 2/25 as a code stroke after being found down and unable to get up on concrete porch of his home. EMS noted right sided weakness and incoherent speech. In ED, stat CT revealed acute left basal ganglia hemorrhage. on 2/25, MRI head revealed appearance of increasing size of left basal ganglia intraparenchymal hematoma and slightly increasing mass effect on left lateral ventricle and 49mm of left to right midline shift.  Subjective: lethargic but able to maintain brief periods of alertness  Recommendations for follow up therapy are one component of a multi-disciplinary  discharge planning process, led by the attending physician.  Recommendations may be updated based on patient status, additional functional criteria and insurance authorization. Assessment / Plan / Recommendation Clinical Impressions 09/09/2021 Clinical Impression The patient was seen for inpatient MBS. Patient presents with a primarily moderate oropharyngeal dysphagia with a moderate risk for aspiration. Bolus preparation and mastication significantly impaired possibly 2/2 inadeaute/missing dentition. Patient could not chew or lateralize regular textures for MBS. Oral phase of swallowing impaired and characterized by decreased labial seal, premature oral spillage, open-mouth posture, poor lateralization/propulsion of bolus, and decreased BOT to palate contact. Initiation of pharyngeal swallow occurred at the level of the pyriform sinuses with thin liquids indicating a delay in swallow initiation. NTL showed slight improvement in patient's ability to control flow rate and initiate pharyngeal swallow sequence. Epiglottic inversion and laryngeal vestibule closure present. Neither penetration or aspiration noted during today's study, however patient's cognitive deficits, impulsivity, and poor awareness place him at a moderate aspiration risk. SLP recommends the patient receive Dysphagia 1 diet with NTL. Medications may be crushed in puree. Continue to incorporate oral care regimen. SLP Visit Diagnosis Dysphagia, oropharyngeal phase (R13.12) Attention and concentration deficit following -- Frontal lobe and executive function deficit following -- Impact on safety and function Moderate aspiration risk   Treatment Recommendations 09/09/2021 Treatment Recommendations Therapy as outlined in treatment plan below   Prognosis 09/09/2021 Prognosis for Safe Diet Advancement Good Barriers to Reach Goals Cognitive deficits;Language deficits Barriers/Prognosis Comment -- Diet Recommendations 09/09/2021 SLP Diet Recommendations Dysphagia 1  (Puree) solids;Nectar thick liquid Liquid Administration via Cup Medication Administration Crushed with puree Compensations Minimize environmental distractions;Slow rate;Small sips/bites;Follow solids with liquid Postural Changes Remain semi-upright after after feeds/meals (Comment);Seated upright at 90 degrees   Other Recommendations 09/09/2021 Recommended Consults -- Oral Care Recommendations Oral care QID Other Recommendations -- Follow Up Recommendations Acute inpatient rehab (3hours/day) Assistance recommended at discharge Frequent or constant Supervision/Assistance Functional Status Assessment Patient has had a recent decline in their functional status and demonstrates the ability to make significant improvements in function in a reasonable and predictable amount of time. Frequency and Duration  09/09/2021 Speech Therapy Frequency (ACUTE ONLY) min  2x/week Treatment Duration 2 weeks   Oral Phase 09/09/2021 Oral Phase Impaired Oral - Pudding Teaspoon -- Oral - Pudding Cup -- Oral - Honey Teaspoon -- Oral - Honey Cup -- Oral - Nectar Teaspoon NT Oral - Nectar Cup Weak lingual manipulation;Lingual/palatal residue;Premature spillage;Delayed oral transit Oral - Nectar Straw NT Oral - Thin Teaspoon Weak lingual manipulation;Incomplete tongue to palate contact;Lingual/palatal residue;Delayed oral transit;Decreased bolus cohesion;Premature spillage Oral - Thin Cup Weak lingual manipulation;Incomplete tongue to palate contact;Lingual/palatal residue;Delayed oral transit;Decreased bolus cohesion;Premature spillage Oral - Thin Straw NT Oral - Puree Impaired mastication;Weak lingual manipulation;Lingual/palatal residue Oral - Mech Soft NT Oral - Regular Impaired mastication;Weak lingual manipulation;Premature spillage;Decreased bolus cohesion Oral - Multi-Consistency NT Oral - Pill NT Oral Phase - Comment --  Pharyngeal Phase 09/09/2021 Pharyngeal Phase Impaired Pharyngeal- Pudding Teaspoon -- Pharyngeal -- Pharyngeal- Pudding Cup  -- Pharyngeal -- Pharyngeal- Honey Teaspoon -- Pharyngeal -- Pharyngeal- Honey Cup -- Pharyngeal -- Pharyngeal- Nectar Teaspoon NT Pharyngeal -- Pharyngeal- Nectar Cup Delayed swallow initiation-vallecula;Pharyngeal residue - valleculae Pharyngeal Material does not enter airway Pharyngeal- Nectar Straw NT Pharyngeal -- Pharyngeal- Thin Teaspoon Delayed swallow initiation-vallecula;Reduced tongue base retraction;Pharyngeal residue - valleculae Pharyngeal Material does not enter airway Pharyngeal- Thin Cup Delayed swallow initiation-pyriform sinuses;Reduced tongue base retraction;Pharyngeal residue - valleculae;Pharyngeal residue - pyriform Pharyngeal Material does not enter airway Pharyngeal- Thin Straw NT Pharyngeal -- Pharyngeal- Puree Delayed swallow initiation-vallecula;Pharyngeal residue - valleculae Pharyngeal Material does not enter airway Pharyngeal- Mechanical Soft NT Pharyngeal -- Pharyngeal- Regular NT Pharyngeal Material does not enter airway Pharyngeal- Multi-consistency NT Pharyngeal -- Pharyngeal- Pill NT Pharyngeal -- Pharyngeal Comment --  No flowsheet data found. DeBlois, Riley Nearing 09/09/2021, 2:28 PM                      PHYSICAL EXAM  Physical Exam  Constitutional: Appears well-developed and well-nourished.  Cardiovascular: Normal rate and regular rhythm.  Respiratory: Effort normal, non-labored breathing Abdomen is distended.  Neuro: Mental Status: Patient is very drowsy, did not speak during exam. Responds to name Nonsensical speech, does not follow commands consistently and mimics occasionally. Right weaker than left, holds left antigravity Cranial Nerves: II:  Pupils are equal, round, and reactive to light.   III,IV, VI: EOMI without ptosis or diploplia.  XI: Head is midline XII: Does not protrude tongue Motor: Tone is normal. Bulk is normal.  LUE, LLE 4/5 RUE, RLE 0/5  Right hemiplegia, Left antigravity Sensory: Sensation is symmetric to light touch in the arms  and legs. No extinction to DSS present.  Cerebellar: Tremor noted bilaterally   ASSESSMENT/PLAN Mr. Hoke Baer is a 61 y.o. male with history of HTN, smoking, esophageal varcies presenting after he was found down on the concrete porch of his home.  He has been on the phone speaking with a relative and she heard him fall and he was able to state that he was on the ground and could not get up family member stated that this happened at 1645 on 2/25 and the son was able to get there within 5 minutes, he then called EMS.  On arrival he had decreased alertness, right hemineglect, dysarthric speech.  Hypertonic saline initiated for cerebral edema with mass effect. Hypertonic saline increased to 26ml/hr, most recent sodium 155. Repeat head CT performed yesterday afternoon reveals unchanged IPH.  CIR signed off.  Will need long-term care placement. Transfer out of ICU if repeat scan is stable.   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 Mild mass effect. Repeat CT- Interval increase in the size of the intraparenchymal hematoma centered in the left basal ganglia, with increased associated edema and 4 mm of left-to-right midline shift. MRI  Redemonstrated intraparenchymal hematoma centered in the left basal ganglia, which appears to have increased in size compared to the study  6 p.m. on 09/05/2021. There is slightly increasing mass effect on the left lateral ventricle and 4 mm of left-to-right midline shift. Repeat head CT 2/27 stable IPH and stable midline shift 2D Echo EF 70-75%, grade 1 diastolic dysfunction VTE prophylaxis -SCDs No antithrombotic prior to admission, now on No antithrombotic. IPH Therapy recommendations:  SNF Disposition:  Pending  Cerebral edema with brain compression increased mass effect on left lateral ventricle Hypertonic  saline increased to 49ml/hr- d/c'd Na- 138-> 140 -> 145 ->155 -> 159 -> 160 -> 162 -> 163 Add FW 200q4 and NS 72ml/hr Continue sodium checks  Hypertension Home meds:  Norvasc 5mg , increased Norvasc 10mg  Stable Keep SBP <160 Long-term BP goal normotensive  Other Stroke Risk Factors ETOH use, alcohol level 206, advised to drink no more than 2 drink(s) a day Cigarette smoker, advised to quit smoking .25ppd  Other Active Problems Alcohol withdrawal - CIWA protocol initiated  Ativan 1-2mg  prn Hypokalemia K 3.2->  replaced 20IV and 20PO Fever  No further fevers. CXR shows no new infiltrates or consolidation Blood cultures drawn no growth Vitamin D deficiency 50,000 IU per dietitian recommendation  Hospital day # 5  Patient seen and examined by NP/APP with MD. MD to update note as needed.   , DNP, FNP-BC [facility] Pager: [phone]    ATTENDING ATTESTATION: 61 year old gentleman history hypertension smoking, alcohol use  found down with  left basal ganglia hemorrhage   On CIWA protocol with Ativan for his agitation.   He continues to be poorly responsive. Will follow intermittent commands to grip/moving leg on left side but will perseverate on this for awhile. Speech is dysarthric and makes no sense. Na still elevated espite hypertonic being off. Add free water today TFs. NS at 150 this am (increased by overnight team), will stop since FW is added.  No more fevers.  Blood cultures with no growth. CT today shows stable hematoma. Concern for delirium vs wernicke. Transfer to the floor now that hematoma is stable. Add thiamine 500mg  TID. Consider librium is needed for agitation. Stopped prn ativan.   Dr. [phone] evaluated pt independently, reviewed imaging, chart, labs. Discussed and formulated plan with the APP. Please see APP note above for details.      This patient is critically ill due to respiratory distress, intracranial hemorrhage with edema on  hypertonic saline and at significant risk of neurological worsening, death form heart failure, respiratory failure, recurrent stroke, bleeding from [facility], seizure, sepsis. This patient's care requires constant monitoring of vital signs, hemodynamics, respiratory and cardiac monitoring, review of multiple databases, neurological assessment, discussion with family, other specialists and medical decision making of high complexity. I spent 35 minutes of neurocritical care time in the care of this patient.    Emillio Ngo,MD    To contact Stroke Continuity provider, please refer to . After hours, contact General Neurology

## 2021-09-10 NOTE — Progress Notes (Signed)
SLP Cancellation Note ? ?Patient Details ?Name: Joshua Price ?MRN: [id number] ?DOB: 1960/12/16 ? ? ?Cancelled treatment:       Reason Eval/Treat Not Completed: Patient at procedure or test/unavailable. Attempted this am, pt too lethargic, this pm, pt out of room. Will f/u tomorrow.  ? ? ?Naithen Rivenburg, Riley Nearing ?09/10/2021, 1:17 PM ?

## 2021-09-10 NOTE — Progress Notes (Signed)
Patient ID: Joshua Price, male   DOB: 30-Jun-1961, 61 y.o.   MRN: [id number] ? ? ?Na elevated to 166. Discussed with ICU nurse to increase FW to 500cc q 4hrs via TF. Con't to monitor Na. ?

## 2021-09-11 DIAGNOSIS — G936 Cerebral edema: Secondary | ICD-10-CM | POA: Insufficient documentation

## 2021-09-11 DIAGNOSIS — E876 Hypokalemia: Secondary | ICD-10-CM

## 2021-09-11 DIAGNOSIS — I10 Essential (primary) hypertension: Secondary | ICD-10-CM

## 2021-09-11 DIAGNOSIS — E870 Hyperosmolality and hypernatremia: Secondary | ICD-10-CM

## 2021-09-11 DIAGNOSIS — R1312 Dysphagia, oropharyngeal phase: Secondary | ICD-10-CM | POA: Diagnosis present

## 2021-09-11 DIAGNOSIS — F10939 Alcohol use, unspecified with withdrawal, unspecified: Secondary | ICD-10-CM | POA: Diagnosis present

## 2021-09-11 LAB — CBC
HCT: 42.8 % (ref 39.0–52.0)
Hemoglobin: 14.4 g/dL (ref 13.0–17.0)
MCH: 33.7 pg (ref 26.0–34.0)
MCHC: 33.6 g/dL (ref 30.0–36.0)
MCV: 100.2 fL — ABNORMAL HIGH (ref 80.0–100.0)
Platelets: 128 10*3/uL — ABNORMAL LOW (ref 150–400)
RBC: 4.27 MIL/uL (ref 4.22–5.81)
RDW: 14.2 % (ref 11.5–15.5)
WBC: 7.1 10*3/uL (ref 4.0–10.5)
nRBC: 0.3 % — ABNORMAL HIGH (ref 0.0–0.2)

## 2021-09-11 LAB — VITAMIN B1: Vitamin B1 (Thiamine): 87.4 nmol/L (ref 66.5–200.0)

## 2021-09-11 LAB — BASIC METABOLIC PANEL
Anion gap: 9 (ref 5–15)
BUN: 12 mg/dL (ref 6–20)
CO2: 23 mmol/L (ref 22–32)
Calcium: 9.4 mg/dL (ref 8.9–10.3)
Chloride: 122 mmol/L — ABNORMAL HIGH (ref 98–111)
Creatinine, Ser: 1.19 mg/dL (ref 0.61–1.24)
GFR, Estimated: >60 mL/min (ref >60)
Glucose, Bld: 166 mg/dL — ABNORMAL HIGH (ref 70–99)
Potassium: 3.2 mmol/L — ABNORMAL LOW (ref 3.5–5.1)
Sodium: 154 mmol/L — ABNORMAL HIGH (ref 135–145)

## 2021-09-11 LAB — GLUCOSE, CAPILLARY
Glucose-Capillary: 116 mg/dL — ABNORMAL HIGH (ref 70–99)
Glucose-Capillary: 137 mg/dL — ABNORMAL HIGH (ref 70–99)
Glucose-Capillary: 155 mg/dL — ABNORMAL HIGH (ref 70–99)
Glucose-Capillary: 163 mg/dL — ABNORMAL HIGH (ref 70–99)
Glucose-Capillary: 171 mg/dL — ABNORMAL HIGH (ref 70–99)
Glucose-Capillary: 196 mg/dL — ABNORMAL HIGH (ref 70–99)

## 2021-09-11 LAB — SODIUM
Sodium: 159 mmol/L — ABNORMAL HIGH (ref 135–145)
Sodium: 155 mmol/L — ABNORMAL HIGH (ref 135–145)
Sodium: 154 mmol/L — ABNORMAL HIGH (ref 135–145)

## 2021-09-11 LAB — PHOSPHORUS: Phosphorus: 3.8 mg/dL (ref 2.5–4.6)

## 2021-09-11 LAB — MAGNESIUM: Magnesium: 2 mg/dL (ref 1.7–2.4)

## 2021-09-11 MED ORDER — FREE WATER: 400.0000 mL | Status: DC

## 2021-09-11 MED ORDER — LORAZEPAM 2 MG/ML IJ SOLN
2.0000 mg | Freq: Once | INTRAMUSCULAR | Status: AC
Start: 2021-09-11 — End: 2021-09-11
  Administered 2021-09-11: 2 mg via INTRAVENOUS
  Filled 2021-09-11: qty 1

## 2021-09-11 MED ORDER — FREE WATER
400.0000 mL | Status: DC
  Administered 2021-09-11 – 2021-09-12 (×5): 400 mL

## 2021-09-11 MED ORDER — VITAMIN D 25 MCG (1000 UNIT) PO TABS
1000.0000 [IU] | ORAL_TABLET | Freq: Every day | ORAL | Status: DC
  Administered 2021-09-11 – 2021-09-19 (×9): 1000 [IU]
  Filled 2021-09-11 (×9): qty 1

## 2021-09-11 MED ORDER — FREE WATER
200.0000 mL | Status: DC
  Administered 2021-09-11: 200 mL

## 2021-09-11 MED ORDER — POTASSIUM CHLORIDE 20 MEQ PO PACK
40.0000 meq | PACK | Freq: Every day | ORAL | Status: DC
  Administered 2021-09-11 – 2021-09-19 (×9): 40 meq
  Filled 2021-09-11 (×9): qty 2

## 2021-09-11 NOTE — Assessment & Plan Note (Addendum)
-  s/p CIWA protocol -resolved Noted to be on thiamine multivitamins folic acid.

## 2021-09-11 NOTE — Assessment & Plan Note (Addendum)
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  Cerebral edema was noted on imaging studies. -admitted to the neuro ICU.  Started on hypertonic saline.  CT head was repeated which showed stable findings with stable midline shift. -Echocardiogram shows normal systolic function with grade 1 diastolic dysfunction. -Neurology has signed off. -PT and OT is following.  Apparently not a good candidate for inpatient rehabilitation due to lack of family support.  Will eventually need to go to skilled nursing facility for rehab but currently no payer source -? Prognosis- patient still with functional deficits- discussed with mother on phone for palliative care consult for GOC as unclear how much if any he will recover- appreciate the on-going coversations with palliative care -paperwork left by mother (FMLA?) - filled out and on chart

## 2021-09-11 NOTE — Progress Notes (Signed)
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 No changes in responsiveness or activity after 5 and 10 mins of IV ativan.  ? ?Princess Bruins, DO ?Resident, PGY-1 ?[facility] ?09/11/2021, 1:16 PM  ?

## 2021-09-11 NOTE — Plan of Care (Signed)
?  Problem: Education: ?Goal: Knowledge of disease or condition will improve ?Outcome: Progressing ?Goal: Knowledge of secondary prevention will improve (SELECT ALL) ?Outcome: Progressing ?Goal: Knowledge of patient specific risk factors will improve (INDIVIDUALIZE FOR PATIENT) ?Outcome: Progressing ?Goal: Individualized Educational Video(s) ?Outcome: Progressing ?  ?Problem: Coping: ?Goal: Will identify appropriate support needs ?Outcome: Progressing ?  ?Problem: Health Behavior/Discharge Planning: ?Goal: Ability to manage health-related needs will improve ?Outcome: Progressing ?  ?Problem: Self-Care: ?Goal: Ability to participate in self-care as condition permits will improve ?Outcome: Progressing ?Goal: Verbalization of feelings and concerns over difficulty with self-care will improve ?Outcome: Progressing ?Goal: Ability to communicate needs accurately will improve ?Outcome: Progressing ?  ?Problem: Nutrition: ?Goal: Risk of aspiration will decrease ?Outcome: Progressing ?Goal: Dietary intake will improve ?Outcome: Progressing ?  ?Problem: Intracerebral Hemorrhage Tissue Perfusion: ?Goal: Complications of Intracerebral Hemorrhage will be minimized ?Outcome: Progressing ?  ?Problem: Safety: ?Goal: Non-violent Restraint(s) ?Outcome: Progressing ?  ?

## 2021-09-11 NOTE — Assessment & Plan Note (Addendum)
-  tube feeds d/c'd 3/9 -DYS diet- has to be fed -aspiration precautions

## 2021-09-11 NOTE — Assessment & Plan Note (Addendum)
Likely due to combination of poor oral intake as well as the fact that he was on hypertonic saline.  Sodium level peaked at 166.   -encourage PO water intake -changed IVF to 1/2 NS (had some hypercalcemia and now hypernatremia)

## 2021-09-11 NOTE — Assessment & Plan Note (Addendum)
Presented with hypertensive emergency.  Currently on amlodipine and metoprolol.  Blood pressure has improved.  Continue current treatment. ?

## 2021-09-11 NOTE — Assessment & Plan Note (Addendum)
Likely due to combination of stroke, hyponatremia, alcohol withdrawal.   -not sure how much patient will improve-- attempts to communicate but is unable to get words out and is frustrated.

## 2021-09-11 NOTE — Assessment & Plan Note (Addendum)
Nutrition Status: Nutrition Problem: Moderate Malnutrition Etiology: social / environmental circumstances (EtOH abuse) Signs/Symptoms: mild fat depletion, moderate fat depletion, mild muscle depletion, severe muscle depletion Interventions: Prostat, Tube feeding, MVI Initially required tube feeding but now on oral diet

## 2021-09-11 NOTE — Hospital Course (Addendum)
61 y.o. male with history of HTN, smoking, esophageal varcies presenting after he was found down on the concrete porch of his home.  He has been on the phone speaking with a relative and she heard him fall and he was able to state that he was on the ground and could not get up family member stated that this happened at 1645 on 2/25 and the son was able to get there within 5 minutes, he then called EMS.  On arrival he had decreased alertness, right hemineglect, dysarthric speech.  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  He was admitted to the neuro ICU.  CT scans were repeated which showed stable findings.   Stay complicated by hypernatremia and aspiration pna.  Continues to have significant dysarthria.  Placement issue.

## 2021-09-11 NOTE — Assessment & Plan Note (Addendum)
Continue daily supplementation, monitor levels

## 2021-09-11 NOTE — Progress Notes (Addendum)
STROKE TEAM PROGRESS NOTE  ? ?INTERVAL HISTORY ?Patient is seen in his room with no family at the bedside.  Na down to 154, will decrease free water flushes to 200cc q4h.  He has been hemodynamically stable with no acute events overnight. ? ?Vitals:  ? 09/11/21 0900 09/11/21 1000 09/11/21 1100 09/11/21 1200  ?BP: (!) 147/92 (!) 159/101 125/85 127/88  ?Pulse: 83 (!) 101 80 79  ?Resp: 19 (!) 31 (!) 22 20  ?Temp:    99.4 ?F (37.4 ?C)  ?TempSrc:    Oral  ?SpO2: 95% 95% 94% 95%  ?Weight:      ?Height:      ? ?CBC:  ?Recent Labs  ?Lab 09/05/21 ?1820 09/05/21 ?1904 09/09/21 ?0730 09/11/21 ?[id number]  ?WBC 4.3   < > 5.8 7.1  ?NEUTROABS 1.7  --   --   --   ?HGB 12.9*   < > 14.9 14.4  ?HCT 38.0*   < > 42.2 42.8  ?MCV 95.5   < > 100.2* 100.2*  ?PLT 112*   < > 121* 128*  ? < > = values in this interval not displayed.  ? ? ?Basic Metabolic Panel:  ?Recent Labs  ?Lab 09/10/21 ?1117 09/10/21 ?1632 09/11/21 ?[id number] 09/11/21 ?[id number]  ?NA 163* 166* 159* 154*  ?K 3.2*  --   --  3.2*  ?CL 130*  --   --  122*  ?CO2 23  --   --  23  ?GLUCOSE 169*  --   --  166*  ?BUN 13  --   --  12  ?CREATININE 1.33*  --   --  1.19  ?CALCIUM 9.5  --   --  9.4  ?MG  --  2.1  --  2.0  ?PHOS  --  3.7  --  3.8  ? ? ?Lipid Panel:  ?Recent Labs  ?Lab 09/07/21 ?0100 09/08/21 ?0615  ?CHOL 255*  --   ?TRIG 678* 287*  ?HDL 34*  --   ?CHOLHDL 7.5  --   ?VLDL UNABLE TO CALCULATE IF TRIGLYCERIDE OVER 400 mg/dL  --   ?LDLCALC UNABLE TO CALCULATE IF TRIGLYCERIDE OVER 400 mg/dL  --   ? ? ?HgbA1c:  ?Recent Labs  ?Lab 09/07/21 ?0100  ?HGBA1C 5.3  ? ? ?Urine Drug Screen:  ?Recent Labs  ?Lab 09/05/21 ?1820  ?LABOPIA NONE DETECTED  ?COCAINSCRNUR NONE DETECTED  ?LABBENZ NONE DETECTED  ?AMPHETMU NONE DETECTED  ?THCU NONE DETECTED  ?LABBARB NONE DETECTED  ? ?  ?Alcohol Level  ?Recent Labs  ?Lab 09/05/21 ?1755  ?ETH 206*  ? ? ? ?IMAGING past 24 hours ?CT HEAD WO CONTRAST (5MM) ? ?Result Date: 09/10/2021 ?CLINICAL DATA:  Follow-up intracranial hemorrhage EXAM: CT HEAD WITHOUT CONTRAST  TECHNIQUE: Contiguous axial images were obtained from the base of the skull through the vertex without intravenous contrast. RADIATION DOSE REDUCTION: This exam was performed according to the departmental dose-optimization program which includes automated exposure control, adjustment of the mA and/or kV according to patient size and/or use of iterative reconstruction technique. COMPARISON:  09/07/2021 FINDINGS: Brain: No change in size of the intraparenchymal hemorrhage within the left hemisphere with the epicenter at the basal ganglia/radiating white matter tracts measuring 5.7 x 2.5 x 4.2 cm (volume = 31 cm^3). Mild surrounding edema. Mild mass effect with left-to-right shift of only 1 mm. No sign of new insult. No sign of cortical ischemic infarction. No hydrocephalus or extra-axial collection. Vascular: There is atherosclerotic calcification of the major vessels at the base  of the brain. Skull: Negative Sinuses/Orbits: Clear/normal Other: None IMPRESSION: No change since the study of 09/07/2021. Intraparenchymal hematoma within the left basal ganglia/radiating white matter tract region, estimated volume 31 cc as seen previously. Electronically Signed   By: Nelson Chimes M.D.   On: 09/10/2021 13:23   ? ?PHYSICAL EXAM ? ?Physical Exam  ?Constitutional: Appears well-developed and well-nourished.  ?Cardiovascular: Normal rate and regular rhythm.  ?Respiratory: Effort normal, non-labored breathing ? ?Neuro: ?Mental Status: ?Patient is less drowsy today and will respond to name ?Nonsensical speech, does not follow commands consistently and mimics occasionally. Right weaker than left, holds left antigravity ?Cranial Nerves: ?II:  Pupils are equal, round, and reactive to light.   ?III,IV, VI: EOMI without ptosis or diploplia.  ?XI: Head is midline ?XII: Does not protrude tongue ?Motor: ?Tone is normal. Bulk is normal.  ?LUE, LLE 4/5 ?RUE 0/5, RLE 1/5  ?Right hemiplegia, Left antigravity ?Sensory: ?Sensation is symmetric  to light touch in the arms and legs. No extinction to DSS present.  ? ? ?ASSESSMENT/PLAN ?Mr. Hatim Crounse is a 61 y.o. male with history of HTN, smoking, esophageal varcies presenting after he was found down on the concrete porch of his home.  He has been on the phone speaking with a relative and she heard him fall and he was able to state that he was on the ground and could not get up family member stated that this happened at 1645 on 2/25 and the son was able to get there within 5 minutes, he then called EMS.  On arrival he had decreased alertness, right hemineglect, dysarthric speech.  Hypertonic saline initiated for cerebral edema with mass effect. Hypertonic saline increased to 29ml/hr, most recent sodium 155. Repeat head CT performed yesterday afternoon reveals unchanged IPH.  CIR signed off.  Will need long-term care placement. Transferred out of ICU but no bed available. ? 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 Mild mass effect. ?Repeat CT- Interval increase in the size of the intraparenchymal hematoma centered in the left basal ganglia, with increased associated edema and 4 mm of left-to-right midline shift. ?MRI  Redemonstrated intraparenchymal hematoma centered in the left basal ganglia, which appears to have increased in size compared to the study  6 p.m. on 09/05/2021. There is slightly increasing mass effect on the left lateral ventricle and 4 mm of left-to-right midline shift. ?Repeat head CT 2/27 stable IPH and stable midline shift ?2D Echo EF [id number], grade 1 diastolic dysfunction ?VTE prophylaxis -SCDs ?No antithrombotic prior to admission, now on No antithrombotic. IPH ?Therapy recommendations:  SNF ?Disposition:  Pending ? ?Cerebral edema with brain compression ?increased mass effect on left lateral ventricle ?Hypertonic saline  increased to 65ml/hr- d/c'd ?Na- 138-> 140 -> 145 ->155 -> 159 -> 160 -> 162 -> 163 -> 154 ?Reduce to  FW 200q4 today. ?Continue sodium checks ? ?Hypertension ?Home meds:  Norvasc 5mg , increased Norvasc 10mg  ?Stable ?Keep SBP <160 ?Long-term BP goal normotensive ? ?Other Stroke Risk Factors ?ETOH use, alcohol level 206, advised to drink no more than 2 drink(s) a day ?Cigarette smoker, advised to quit smoking ?.25ppd ? ?Other Active Problems ?Alcohol withdrawal - CIWA protocol initiated  ?Ativan 1-2mg  prn ?Hypokalemia ?K 3.2->  replaced 20IV and 20PO ?Fever ? No further fevers. ?CXR shows no new infiltrates or consolidation ?Blood cultures drawn no growth ?Vitamin D deficiency ?50,000 IU per dietitian  recommendation ? ?Hospital day # 6 ? ?Patient seen and examined by NP/APP with MD. MD to update note as needed.  ? ?[place] , MSN, AGACNP-BC ?[facility] ?See Amion for schedule and pager information ?09/11/2021 12:24 PM ? ?ATTENDING ATTESTATION: ?61 year old gentleman history hypertension smoking, alcohol use  found down with  left basal ganglia hemorrhage   he is awake but did not follow commands consistently.  Speech is dysarthric and nonsensical.   ?Sodium was elevated yesterday Free water is increased to 400 every 4 hours.  It improved today Free water was reduced down to 200 every 4 hours need to continue to follow sodium.  Concern for delirium vs wernicke.  On high-dose thiamine 500mg  TID. Consider librium is needed for agitation.  Psych resident recommended trial of Ativan to see if catatonia improves but he did not. ? ?Neurology will continue to follow until he is stabilized  ?  ?Dr. Reeves Forth evaluated pt independently, reviewed imaging, chart, labs. Discussed and formulated plan with the APP. Please see APP note above for details.    ?  ? ?Total of 35 mins spent reviewing chart, discussion with patient and family on prognosis, Dx and plan. Discussed case with patient's nurse. Reviewed  Imaging personally.  ?  ?Colletta Spillers,MD  ? ?To contact Stroke Continuity provider, please refer to [email or web address] ?After hours, contact General Neurology ? ?

## 2021-09-11 NOTE — Progress Notes (Signed)
[facility] PROGRESS NOTE   Joshua Price XNT:[id number] DOB: 1961/04/08 DOA: 09/05/2021  6 DOS: the patient was seen and examined on 09/11/2021  PCP: Dartha Lodge, FNP  Brief History and Hospital Course:   61 y.o. male with history of HTN, smoking, esophageal varcies presenting after he was found down on the concrete porch of his home.  He has been on the phone speaking with a relative and she heard him fall and he was able to state that he was on the ground and could not get up family member stated that this happened at 1645 on 2/25 and the son was able to get there within 5 minutes, he then called EMS.  On arrival he had decreased alertness, right hemineglect, dysarthric speech.  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  He was admitted to the neuro ICU.  Started on hypertonic saline which was subsequently discontinued.  CT scans were repeated which showed stable findings.  Developed hypernatremia but seems to be improving.  Continues to have significant dysarthria as well as dysphagia.  Consultants: Neurology.  Procedures: Transthoracic echocardiogram.  Cortrack feeding tube placement    Subjective: Patient responds to voice.  Follow certain commands.  Somewhat distracted.  Quite dysarthric.    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 Patient was admitted to the neuro ICU.  Started on hypertonic saline.  CT head was repeated which showed stable findings with stable midline shift. Patient was also found to have cerebral edema. Echocardiogram shows normal systolic function with grade 1 diastolic dysfunction. Mentation appears to be gradually improving.  He has dense right hemiparesis. PT and OT following.  Apparently not a good candidate for inpatient rehabilitation. Neurology to continue to follow for  now.  Hypernatremia Likely due to combination of poor oral intake as well as the fact that he was on hypertonic saline.  Sodium level peaked at 166.  His normal saline infusion was discontinued yesterday.  Free water was started through the feeding tube.  Sodium levels appear to be improving.  Continue to monitor every 6 hours for now.  Essential hypertension Initially presented with hypertensive emergency.  Currently on amlodipine and metoprolol.  Blood pressure goal is less than 160 systolic.  Continue to monitor.  Oropharyngeal dysphagia Speech therapy is following.  Currently getting tube feedings.  Alcohol withdrawal (HCC) Started on CIWA protocol.  No active withdrawal symptoms noted currently.  Noted to be on thiamine multivitamins folic acid.  Malnutrition of moderate degree On tube feedings.  Hypokalemia Replace.  Magnesium 2.0.  Altered mental status Likely due to combination of stroke, hyponatremia, alcohol withdrawal.  Seems to be gradually improving.    DVT Prophylaxis: SCDs only for now Code Status: Full code Family Communication: No family at bedside Disposition Plan: Most likely will need to go to skilled nursing facility  Status is: Inpatient Remains inpatient appropriate because: Acute intracranial hemorrhage      Medications: Scheduled:  amLODipine  10 mg Per Tube Daily   chlorhexidine  15 mL Mouth Rinse BID   Chlorhexidine Gluconate Cloth  6 each Topical Daily   feeding supplement (PROSource TF)  45 mL Per Tube Daily   folic acid  1 mg Per Tube Daily   free water  400 mL Per Tube Q4H   insulin aspart  0-15 Units Subcutaneous Q4H   mouth rinse  15 mL Mouth Rinse q12n4p  metoprolol tartrate  25 mg Per Tube BID   multivitamin with minerals  1 tablet Per Tube Daily   pantoprazole sodium  40 mg Per Tube QHS   polyethylene glycol  17 g Per Tube Daily   potassium chloride  40 mEq Per Tube Daily   senna-docusate  1 tablet Per Tube BID   thiamine  500  mg Per Tube TID   Vitamin D (Ergocalciferol)  50,000 Units Oral Q7 days   Continuous:  feeding supplement (VITAL 1.5 CAL) 1,000 mL (09/11/21 0400)   ZOX:[id number] **OR** acetaminophen (TYLENOL) oral liquid 160 mg/5 mL **OR** acetaminophen, hydrALAZINE, labetalol  Antibiotics: Anti-infectives (From admission, onward)    None       Objective:  Vital Signs  Vitals:   09/11/21 0500 09/11/21 0600 09/11/21 0700 09/11/21 0800  BP: (!) 165/113 (!) 141/83 (!) 140/91   Pulse: 90 81 79   Resp: (!) 25 (!) 22 16   Temp:    98.9 F (37.2 C)  TempSrc:    Oral  SpO2: 97% 97% 95%   Weight:      Height:        Intake/Output Summary (Last 24 hours) at 09/11/2021 0839 Last data filed at 09/11/2021 0600 Gross per 24 hour  Intake 2844.97 ml  Output 1350 ml  Net 1494.97 ml   Filed Weights   09/05/21 1751 09/09/21 0630 09/10/21 0500  Weight: 65.8 kg 69 kg 70.9 kg    General appearance: Awake alert.  In no distress.  Distracted. Resp: Clear to auscultation bilaterally.  Normal effort Cardio: S1-S2 is normal regular.  No S3-S4.  No rubs murmurs or bruit GI: Abdomen is soft.  Nontender nondistended.  Bowel sounds are present normal.  No masses organomegaly Extremities: No edema.   Neurologic: Right hemiparesis noted   Lab Results:  Data Reviewed: I have personally reviewed labs and imaging study reports  CBC: Recent Labs  Lab 09/05/21 1820 09/05/21 1904 09/05/21 2140 09/07/21 0751 09/08/21 0615 09/09/21 0730 09/11/21 0637  WBC 4.3  --   --  5.0 5.4 5.8 7.1  NEUTROABS 1.7  --   --   --   --   --   --   HGB 12.9*   < > 14.3 13.5 13.8 14.9 14.4  HCT 38.0*   < > 42.0 38.8* 41.3 42.2 42.8  MCV 95.5  --   --  97.2 98.6 100.2* 100.2*  PLT 112*  --   --  121* 118* 121* 128*   < > = values in this interval not displayed.    Basic Metabolic Panel: Recent Labs  Lab 09/07/21 0751 09/07/21 1250 09/08/21 0615 09/08/21 1132 09/09/21 0730 09/09/21 1150 09/09/21 1828  09/10/21 0511 09/10/21 1117 09/10/21 1632 09/11/21 0039 09/11/21 0637  NA 145   < > 154*   < > 161*   < > 160* 162* 163* 166* 159* 154*  K 3.3*  --  3.2*  --  3.0*  --   --   --  3.2*  --   --  3.2*  CL 110  --  119*  --  128*  --   --   --  130*  --   --  122*  CO2 26  --  24  --  22  --   --   --  23  --   --  23  GLUCOSE 142*  --  147*  --  125*  --   --   --  169*  --   --  166*  BUN 6  --  <5*  --  6  --   --   --  13  --   --  12  CREATININE 0.87  --  0.97  --  1.00  --   --   --  1.33*  --   --  1.19  CALCIUM 8.8*  --  9.2  --  9.2  --   --   --  9.5  --   --  9.4  MG 2.2  --   --   --   --   --  1.9 2.2  --  2.1  --  2.0  PHOS 2.4*  --  2.6  --   --   --  4.3 4.2  --  3.7  --  3.8   < > = values in this interval not displayed.    GFR: Estimated Creatinine Clearance: 59.6 mL/min (by C-G formula based on SCr of 1.19 mg/dL).  Liver Function Tests: Recent Labs  Lab 09/05/21 1755  AST 93*  ALT 35  ALKPHOS 107  BILITOT 1.1  PROT 8.2*  ALBUMIN 3.5     Coagulation Profile: Recent Labs  Lab 09/05/21 1820  INR 1.1     CBG: Recent Labs  Lab 09/10/21 1510 09/10/21 1913 09/10/21 2320 09/11/21 0259 09/11/21 0732  GLUCAP 115* 204* 135* 116* 155*     Anemia Panel: Recent Labs    09/09/21 1316  VITAMINB12 1,384*  FOLATE 22.5    Recent Results (from the past 240 hour(s))  Resp Panel by RT-PCR (Flu A&B, Covid) Nasopharyngeal Swab     Status: None   Collection Time: 09/05/21  6:59 PM   Specimen: Nasopharyngeal Swab; Nasopharyngeal(NP) swabs in vial transport medium  Result Value Ref Range Status   SARS Coronavirus 2 by RT PCR NEGATIVE NEGATIVE Final    Comment: (NOTE) SARS-CoV-2 target nucleic acids are NOT DETECTED.  The SARS-CoV-2 RNA is generally detectable in upper respiratory specimens during the acute phase of infection. The lowest concentration of SARS-CoV-2 viral copies this assay can detect is 138 copies/mL. A negative result does not preclude  SARS-Cov-2 infection and should not be used as the sole basis for treatment or other patient management decisions. A negative result may occur with  improper specimen collection/handling, submission of specimen other than nasopharyngeal swab, presence of viral mutation(s) within the areas targeted by this assay, and inadequate number of viral copies(<138 copies/mL). A negative result must be combined with clinical observations, patient history, and epidemiological information. The expected result is Negative.  Fact Sheet for Patients:  BloggerCourse.comhttps://www.fda.gov/media/152166/download  Fact Sheet for Healthcare Providers:  SeriousBroker.ithttps://www.fda.gov/media/152162/download  This test is no t yet approved or cleared by the [place] FDA and  has been authorized for detection and/or diagnosis of SARS-CoV-2 by FDA under an Emergency Use Authorization (EUA). This EUA will remain  in effect (meaning this test can be used) for the duration of the COVID-19 declaration under Section 564(b)(1) of the Act, 21 U.S.C.section 360bbb-3(b)(1), unless the authorization is terminated  or revoked sooner.       Influenza A by PCR NEGATIVE NEGATIVE Final   Influenza B by PCR NEGATIVE NEGATIVE Final    Comment: (NOTE) The Xpert Xpress SARS-CoV-2/FLU/RSV plus assay is intended as an aid in the diagnosis of influenza from Nasopharyngeal swab specimens and should not be used as a sole basis for treatment. Nasal washings and aspirates are unacceptable for Xpert  Xpress SARS-CoV-2/FLU/RSV testing.  Fact Sheet for Patients: BloggerCourse.com  Fact Sheet for Healthcare Providers: SeriousBroker.it  This test is not yet approved or cleared by the [place] FDA and has been authorized for detection and/or diagnosis of SARS-CoV-2 by FDA under an Emergency Use Authorization (EUA). This EUA will remain in effect (meaning this test can be used) for the duration of  the COVID-19 declaration under Section 564(b)(1) of the Act, 21 U.S.C. section 360bbb-3(b)(1), unless the authorization is terminated or revoked.  Performed at [facility], [address], [place], [place] [address]   MRSA Next Gen by PCR, Nasal     Status: None   Collection Time: 09/05/21 10:06 PM   Specimen: Nasal Mucosa; Nasal Swab  Result Value Ref Range Status   MRSA by PCR Next Gen NOT DETECTED NOT DETECTED Final    Comment: (NOTE) The GeneXpert MRSA Assay (FDA approved for NASAL specimens only), is one component of a comprehensive MRSA colonization surveillance program. It is not intended to diagnose MRSA infection nor to guide or monitor treatment for MRSA infections. Test performance is not FDA approved in patients less than 14 years old. Performed at [facility], [address] [place], [place] [address]   Culture, blood (routine x 2)     Status: None (Preliminary result)   Collection Time: 09/08/21  6:05 AM   Specimen: BLOOD RIGHT HAND  Result Value Ref Range Status   Specimen Description BLOOD RIGHT HAND  Final   Special Requests   Final    BOTTLES DRAWN AEROBIC AND ANAEROBIC Blood Culture results may not be optimal due to an excessive volume of blood received in culture bottles   Culture   Final    NO GROWTH 3 DAYS Performed at [facility], [address], [place], [place] [address]    Report Status PENDING  Incomplete  Culture, blood (routine x 2)     Status: None (Preliminary result)   Collection Time: 09/08/21  6:14 AM   Specimen: BLOOD LEFT HAND  Result Value Ref Range Status   Specimen Description BLOOD LEFT HAND  Final   Special Requests   Final    BOTTLES DRAWN AEROBIC AND ANAEROBIC Blood Culture adequate volume   Culture   Final    NO GROWTH 3 DAYS Performed at [facility], [address], [place], [place] [address]    Report Status PENDING  Incomplete      Radiology Studies: CT HEAD WO CONTRAST ( )  Result Date:  09/10/2021 CLINICAL DATA:  Follow-up intracranial hemorrhage EXAM: CT HEAD WITHOUT CONTRAST TECHNIQUE: Contiguous axial images were obtained from the base of the skull through the vertex without intravenous contrast. RADIATION DOSE REDUCTION: This exam was performed according to the departmental dose-optimization program which includes automated exposure control, adjustment of the mA and/or kV according to patient size and/or use of iterative reconstruction technique. COMPARISON:  09/07/2021 FINDINGS: Brain: No change in size of the intraparenchymal hemorrhage within the left hemisphere with the epicenter at the basal ganglia/radiating white matter tracts measuring 5.7 x 2.5 x 4.2 cm (volume = 31 cm^3). Mild surrounding edema. Mild mass effect with left-to-right shift of only 1 mm. No sign of new insult. No sign of cortical ischemic infarction. No hydrocephalus or extra-axial collection. Vascular: There is atherosclerotic calcification of the major vessels at the base of the brain. Skull: Negative Sinuses/Orbits: Clear/normal Other: None IMPRESSION: No change since the study of 09/07/2021. Intraparenchymal hematoma within the  left basal ganglia/radiating white matter tract region, estimated volume 31 cc as seen previously. Electronically Signed   By: Paulina Fusi M.D.   On: 09/10/2021 13:23   DG Abd Portable 1V  Result Date: 09/09/2021 CLINICAL DATA:  Feeding tube placement. EXAM: PORTABLE ABDOMEN - 1 VIEW COMPARISON:  09/08/2021 FINDINGS: The previous enteric tube has been removed. A feeding tube has been placed with tip projecting in the right upper quadrant in the region of the gastric pylorus. No dilated loops of bowel are seen to suggest obstruction. No acute osseous abnormality is seen. IMPRESSION: Feeding tube terminating over the distal stomach. Electronically Signed   By: Sebastian Ache M.D.   On: 09/09/2021 12:50   DG Swallowing Func-Speech Pathology  Result Date: 09/09/2021 Table formatting from the  original result was not included. Objective Swallowing Evaluation: Type of Study: MBS-Modified Barium Swallow Study  Completed and documented by Ezekiel Slocumb, SLP Student Supervised and reviewed by Harlon Ditty MA CCC-SLP Patient Details Name: Joshua Price MRN: [id number] Date of Birth: April 12, 1961 Today's Date: 09/09/2021 Time: SLP Start Time (ACUTE ONLY): 1340 -SLP Stop Time (ACUTE ONLY): 1400 SLP Time Calculation (min) (ACUTE ONLY): 20 min Past Medical History: Past Medical History: Diagnosis Date  Esophageal varices (HCC)  Past Surgical History: No past surgical history on file. HPI: Patient is a 61 y.o. male with PMH: HTN, tobacco use, esophageal varices who presented to ED via EMS on 2/25 as a code stroke after being found down and unable to get up on concrete porch of his home. EMS noted right sided weakness and incoherent speech. In ED, stat CT revealed acute left basal ganglia hemorrhage. on 2/25, MRI head revealed appearance of increasing size of left basal ganglia intraparenchymal hematoma and slightly increasing mass effect on left lateral ventricle and 16mm of left to right midline shift.  Subjective: lethargic but able to maintain brief periods of alertness  Recommendations for follow up therapy are one component of a multi-disciplinary discharge planning process, led by the attending physician.  Recommendations may be updated based on patient status, additional functional criteria and insurance authorization. Assessment / Plan / Recommendation Clinical Impressions 09/09/2021 Clinical Impression The patient was seen for inpatient MBS. Patient presents with a primarily moderate oropharyngeal dysphagia with a moderate risk for aspiration. Bolus preparation and mastication significantly impaired possibly 2/2 inadeaute/missing dentition. Patient could not chew or lateralize regular textures for MBS. Oral phase of swallowing impaired and characterized by decreased labial seal, premature oral spillage,  open-mouth posture, poor lateralization/propulsion of bolus, and decreased BOT to palate contact. Initiation of pharyngeal swallow occurred at the level of the pyriform sinuses with thin liquids indicating a delay in swallow initiation. NTL showed slight improvement in patient's ability to control flow rate and initiate pharyngeal swallow sequence. Epiglottic inversion and laryngeal vestibule closure present. Neither penetration or aspiration noted during today's study, however patient's cognitive deficits, impulsivity, and poor awareness place him at a moderate aspiration risk. SLP recommends the patient receive Dysphagia 1 diet with NTL. Medications may be crushed in puree. Continue to incorporate oral care regimen. SLP Visit Diagnosis Dysphagia, oropharyngeal phase (R13.12) Attention and concentration deficit following -- Frontal lobe and executive function deficit following -- Impact on safety and function Moderate aspiration risk   Treatment Recommendations 09/09/2021 Treatment Recommendations Therapy as outlined in treatment plan below   Prognosis 09/09/2021 Prognosis for Safe Diet Advancement Good Barriers to Reach Goals Cognitive deficits;Language deficits Barriers/Prognosis Comment -- Diet Recommendations 09/09/2021 SLP Diet Recommendations Dysphagia 1 (Puree)  solids;Nectar thick liquid Liquid Administration via Cup Medication Administration Crushed with puree Compensations Minimize environmental distractions;Slow rate;Small sips/bites;Follow solids with liquid Postural Changes Remain semi-upright after after feeds/meals (Comment);Seated upright at 90 degrees   Other Recommendations 09/09/2021 Recommended Consults -- Oral Care Recommendations Oral care QID Other Recommendations -- Follow Up Recommendations Acute inpatient rehab (3hours/day) Assistance recommended at discharge Frequent or constant Supervision/Assistance Functional Status Assessment Patient has had a recent decline in their functional status and  demonstrates the ability to make significant improvements in function in a reasonable and predictable amount of time. Frequency and Duration  09/09/2021 Speech Therapy Frequency (ACUTE ONLY) min 2x/week Treatment Duration 2 weeks   Oral Phase 09/09/2021 Oral Phase Impaired Oral - Pudding Teaspoon -- Oral - Pudding Cup -- Oral - Honey Teaspoon -- Oral - Honey Cup -- Oral - Nectar Teaspoon NT Oral - Nectar Cup Weak lingual manipulation;Lingual/palatal residue;Premature spillage;Delayed oral transit Oral - Nectar Straw NT Oral - Thin Teaspoon Weak lingual manipulation;Incomplete tongue to palate contact;Lingual/palatal residue;Delayed oral transit;Decreased bolus cohesion;Premature spillage Oral - Thin Cup Weak lingual manipulation;Incomplete tongue to palate contact;Lingual/palatal residue;Delayed oral transit;Decreased bolus cohesion;Premature spillage Oral - Thin Straw NT Oral - Puree Impaired mastication;Weak lingual manipulation;Lingual/palatal residue Oral - Mech Soft NT Oral - Regular Impaired mastication;Weak lingual manipulation;Premature spillage;Decreased bolus cohesion Oral - Multi-Consistency NT Oral - Pill NT Oral Phase - Comment --  Pharyngeal Phase 09/09/2021 Pharyngeal Phase Impaired Pharyngeal- Pudding Teaspoon -- Pharyngeal -- Pharyngeal- Pudding Cup -- Pharyngeal -- Pharyngeal- Honey Teaspoon -- Pharyngeal -- Pharyngeal- Honey Cup -- Pharyngeal -- Pharyngeal- Nectar Teaspoon NT Pharyngeal -- Pharyngeal- Nectar Cup Delayed swallow initiation-vallecula;Pharyngeal residue - valleculae Pharyngeal Material does not enter airway Pharyngeal- Nectar Straw NT Pharyngeal -- Pharyngeal- Thin Teaspoon Delayed swallow initiation-vallecula;Reduced tongue base retraction;Pharyngeal residue - valleculae Pharyngeal Material does not enter airway Pharyngeal- Thin Cup Delayed swallow initiation-pyriform sinuses;Reduced tongue base retraction;Pharyngeal residue - valleculae;Pharyngeal residue - pyriform Pharyngeal Material  does not enter airway Pharyngeal- Thin Straw NT Pharyngeal -- Pharyngeal- Puree Delayed swallow initiation-vallecula;Pharyngeal residue - valleculae Pharyngeal Material does not enter airway Pharyngeal- Mechanical Soft NT Pharyngeal -- Pharyngeal- Regular NT Pharyngeal Material does not enter airway Pharyngeal- Multi-consistency NT Pharyngeal -- Pharyngeal- Pill NT Pharyngeal -- Pharyngeal Comment --  No flowsheet data found. DeBlois, Riley NearingBonnie Caroline 09/09/2021, 2:28 PM                         LOS: 6 days   Yasha Tibbett Rito EhrlichKrishnan  [facility] Pager on [email or web address]  09/11/2021, 8:39 AM

## 2021-09-11 NOTE — Progress Notes (Signed)
Speech Language Pathology Treatment: Dysphagia  ?Patient Details ?Name: Joshua Price ?MRN: [id number] ?DOB: 29-Jul-1960 ?Today's Date: 09/11/2021 ?Time: 2353-6144 ?SLP Time Calculation (min) (ACUTE ONLY): 11 min ? ?Assessment / Plan / Recommendation ?Clinical Impression ? SWALLOWING ?RN reports poor PO intake yesterday during day.  When sister arrived later in the day pt ate around 16oz of applesauce with her.  RN reports possible pt distaste for puree tray. Pt may prefer foods which are typically consumed at puree texture (mashed potatoes, applesauce, yogurt).  SLP provided oral care prior to PO intake.  Dried blood noted in mouth.  With oral care additional bright red blood began to appear around gums as well.  Continue thorough but gentle oral care.  Pt was somewhat resistant to oral care.  Pt recoiled from both apple sauce and nectar thick liquid when presented.  With labial stimulation, pt closed oral cavity, shook his head and pushed spoon away.  A small amount of both puree and NTL was given.  Pt did not have appreciable swallow reflex and SLP removed bolus trials with suction.  RN reports good tolerance of current diet texture when he does accept POS.   ? ?Recommend continuing puree diet with nectar thick liquid. Pt will likely need to continue AMN to meet nutritional needs.   ? ?COMMUNICATION ?Not directly addressed during this session.  RN reports pt clearly stated "No" appropriately yesterday, which is only clear word she has heard.  Today pt used gesture, waving SLP away, guarding face with hands rather than verbalization.  Suspect dysarthria as well global aphasia noted during evaluation. Pt showing good communicative intent, though verbal communication remains at very low level. ? ?  ?HPI HPI: Patient is a 61 y.o. male with PMH: HTN, tobacco use, esophageal varices who presented to ED via EMS on 2/25 as a code stroke after being found down and unable to get up on concrete porch of his home. EMS noted right  sided weakness and incoherent speech. In ED, stat CT revealed acute left basal ganglia hemorrhage. on 2/25, MRI head revealed appearance of increasing size of left basal ganglia intraparenchymal hematoma and slightly increasing mass effect on left lateral ventricle and 42mm of left to right midline shift. ?  ?   ?SLP Plan ? Continue with current plan of care ? ?  ?  ?Recommendations for follow up therapy are one component of a multi-disciplinary discharge planning process, led by the attending physician.  Recommendations may be updated based on patient status, additional functional criteria and insurance authorization. ?  ? ?Recommendations  ?Diet recommendations: Dysphagia 1 (puree);Nectar-thick liquid ?Liquids provided via: Cup ?Medication Administration: Via alternative means (oral meds could be given crushed in puree) ?Supervision: Trained caregiver to feed patient;Staff to assist with self feeding ?Compensations: Minimize environmental distractions;Slow rate;Small sips/bites;Follow solids with liquid ?Postural Changes and/or Swallow Maneuvers: Seated upright 90 degrees  ?   ?    ?   ? ? ? ? Oral Care Recommendations: Oral care QID;Staff/trained caregiver to provide oral care ?Follow Up Recommendations: Acute inpatient rehab (3hours/day) ?Assistance recommended at discharge: Frequent or constant Supervision/Assistance ?SLP Visit Diagnosis: Dysphagia, oropharyngeal phase (R13.12) ?Plan: Continue with current plan of care ? ? ? ? ?  ?  ? ? ?Abie Cheek E Abriel Geesey , MA, CCC-SLP ?Acute Rehabilitation Services ?Office: [phone] ? ?09/11/2021, 10:19 AM ?

## 2021-09-11 NOTE — Progress Notes (Signed)
Attempted to call patient's mother to inform her of the transfer, but there was no answer, no other numbers available on the chart.  ?

## 2021-09-12 ENCOUNTER — Inpatient Hospital Stay (HOSPITAL_COMMUNITY): Payer: Medicaid Other

## 2021-09-12 DIAGNOSIS — I161 Hypertensive emergency: Secondary | ICD-10-CM

## 2021-09-12 DIAGNOSIS — R509 Fever, unspecified: Secondary | ICD-10-CM

## 2021-09-12 LAB — URINALYSIS, ROUTINE W REFLEX MICROSCOPIC
Bilirubin Urine: NEGATIVE
Glucose, UA: NEGATIVE mg/dL
Hgb urine dipstick: NEGATIVE
Ketones, ur: NEGATIVE mg/dL
Leukocytes,Ua: NEGATIVE
Nitrite: POSITIVE — AB
Protein, ur: 30 mg/dL — AB
Specific Gravity, Urine: 1.02 (ref 1.005–1.030)
pH: 6 (ref 5.0–8.0)

## 2021-09-12 LAB — BASIC METABOLIC PANEL
Anion gap: 11 (ref 5–15)
BUN: 17 mg/dL (ref 6–20)
CO2: 24 mmol/L (ref 22–32)
Calcium: 9.2 mg/dL (ref 8.9–10.3)
Chloride: 115 mmol/L — ABNORMAL HIGH (ref 98–111)
Creatinine, Ser: 1.11 mg/dL (ref 0.61–1.24)
GFR, Estimated: >60 mL/min (ref >60)
Glucose, Bld: 156 mg/dL — ABNORMAL HIGH (ref 70–99)
Potassium: 3.2 mmol/L — ABNORMAL LOW (ref 3.5–5.1)
Sodium: 150 mmol/L — ABNORMAL HIGH (ref 135–145)

## 2021-09-12 LAB — CBC
HCT: 39 % (ref 39.0–52.0)
Hemoglobin: 12.9 g/dL — ABNORMAL LOW (ref 13.0–17.0)
MCH: 32.5 pg (ref 26.0–34.0)
MCHC: 33.1 g/dL (ref 30.0–36.0)
MCV: 98.2 fL (ref 80.0–100.0)
Platelets: 128 10*3/uL — ABNORMAL LOW (ref 150–400)
RBC: 3.97 MIL/uL — ABNORMAL LOW (ref 4.22–5.81)
RDW: 13.7 % (ref 11.5–15.5)
WBC: 6.7 10*3/uL (ref 4.0–10.5)
nRBC: 0.3 % — ABNORMAL HIGH (ref 0.0–0.2)

## 2021-09-12 LAB — GLUCOSE, CAPILLARY
Glucose-Capillary: 142 mg/dL — ABNORMAL HIGH (ref 70–99)
Glucose-Capillary: 164 mg/dL — ABNORMAL HIGH (ref 70–99)
Glucose-Capillary: 169 mg/dL — ABNORMAL HIGH (ref 70–99)
Glucose-Capillary: 127 mg/dL — ABNORMAL HIGH (ref 70–99)
Glucose-Capillary: 198 mg/dL — ABNORMAL HIGH (ref 70–99)
Glucose-Capillary: 208 mg/dL — ABNORMAL HIGH (ref 70–99)

## 2021-09-12 LAB — SODIUM
Sodium: 150 mmol/L — ABNORMAL HIGH (ref 135–145)
Sodium: 151 mmol/L — ABNORMAL HIGH (ref 135–145)

## 2021-09-12 MED ORDER — FREE WATER
100.0000 mL | Status: DC
  Administered 2021-09-12 – 2021-09-17 (×30): 100 mL

## 2021-09-12 MED ORDER — AMOXICILLIN-POT CLAVULANATE 400-57 MG/5ML PO SUSR
875.0000 mg | Freq: Two times a day (BID) | ORAL | Status: AC
  Administered 2021-09-12 – 2021-09-16 (×10): 875 mg
  Filled 2021-09-12 (×8): qty 10.9
  Filled 2021-09-12: qty 10.94
  Filled 2021-09-12: qty 10.9

## 2021-09-12 MED ORDER — IOHEXOL 350 MG/ML SOLN
75.0000 mL | Freq: Once | INTRAVENOUS | Status: AC | PRN
  Administered 2021-09-12: 75 mL via INTRAVENOUS

## 2021-09-12 MED ORDER — POTASSIUM CHLORIDE 20 MEQ PO PACK
40.0000 meq | PACK | Freq: Once | ORAL | Status: AC
  Administered 2021-09-12: 40 meq via ORAL
  Filled 2021-09-12: qty 2

## 2021-09-12 MED ORDER — HEPARIN SODIUM (PORCINE) 5000 UNIT/ML IJ SOLN
5000.0000 [IU] | Freq: Three times a day (TID) | INTRAMUSCULAR | Status: DC
  Administered 2021-09-12 – 2021-10-01 (×53): 5000 [IU] via SUBCUTANEOUS
  Filled 2021-09-12 (×53): qty 1

## 2021-09-12 NOTE — Progress Notes (Addendum)
STROKE TEAM PROGRESS NOTE  ? ?INTERVAL HISTORY ?Patient is seen in his room with his brother at the bedside.  HE has been hemodynamically stable overnight but has been febrile.  His neurological exam is stable and his Na is slowly decreasing. ? ?Vitals:  ? 09/12/21 0031 09/12/21 0300 09/12/21 0731 09/12/21 1112  ?BP:  134/90 138/79 109/81  ?Pulse: 88 87 91 90  ?Resp: 18 20 18 19   ?Temp: 99.9 ?F (37.7 ?C) 99.3 ?F (37.4 ?C) 99 ?F (37.2 ?C) 98.7 ?F (37.1 ?C)  ?TempSrc: Oral Oral Oral Oral  ?SpO2: 95% 95% 94% 94%  ?Weight:      ?Height:      ? ?CBC:  ?Recent Labs  ?Lab 09/05/21 ?1820 09/05/21 ?1904 09/11/21 ?[id number] 09/12/21 ?[id number]  ?WBC 4.3   < > 7.1 6.7  ?NEUTROABS 1.7  --   --   --   ?HGB 12.9*   < > 14.4 12.9*  ?HCT 38.0*   < > 42.8 39.0  ?MCV 95.5   < > 100.2* 98.2  ?PLT 112*   < > 128* 128*  ? < > = values in this interval not displayed.  ? ? ?Basic Metabolic Panel:  ?Recent Labs  ?Lab 09/10/21 ?1632 09/11/21 ?[id number] 09/11/21 ?[id number] 09/11/21 ?1203 09/11/21 ?2336 09/12/21 ?[id number]  ?NA 166*   < > 154*   < > 151* 150*  ?K  --   --  3.2*  --   --  3.2*  ?CL  --   --  122*  --   --  115*  ?CO2  --   --  23  --   --  24  ?GLUCOSE  --   --  166*  --   --  156*  ?BUN  --   --  12  --   --  17  ?CREATININE  --   --  1.19  --   --  1.11  ?CALCIUM  --   --  9.4  --   --  9.2  ?MG 2.1  --  2.0  --   --   --   ?PHOS 3.7  --  3.8  --   --   --   ? < > = values in this interval not displayed.  ? ? ?Lipid Panel:  ?Recent Labs  ?Lab 09/07/21 ?0100 09/08/21 ?0615  ?CHOL 255*  --   ?TRIG 678* 287*  ?HDL 34*  --   ?CHOLHDL 7.5  --   ?VLDL UNABLE TO CALCULATE IF TRIGLYCERIDE OVER 400 mg/dL  --   ?LDLCALC UNABLE TO CALCULATE IF TRIGLYCERIDE OVER 400 mg/dL  --   ? ? ?HgbA1c:  ?Recent Labs  ?Lab 09/07/21 ?0100  ?HGBA1C 5.3  ? ? ?Urine Drug Screen:  ?Recent Labs  ?Lab 09/05/21 ?1820  ?LABOPIA NONE DETECTED  ?COCAINSCRNUR NONE DETECTED  ?LABBENZ NONE DETECTED  ?AMPHETMU NONE DETECTED  ?THCU NONE DETECTED  ?LABBARB NONE DETECTED  ? ?  ?Alcohol Level   ?Recent Labs  ?Lab 09/05/21 ?1755  ?ETH 206*  ? ? ? ?IMAGING past 24 hours ?DG CHEST PORT 1 VIEW ? ?Result Date: 09/12/2021 ?CLINICAL DATA:  Intracranial hematoma.  Fevers. EXAM: PORTABLE CHEST 1 VIEW COMPARISON:  09/08/2021 FINDINGS: Feeding tube tip is well below the level of the hemidiaphragms. Heart size is stable. No pleural effusion or edema identified. Mild patchy opacity within the left lower lobe is new compared with previous exam. Right lung is clear. IMPRESSION: New patchy opacity within the left  lower lobe which may represent atelectasis or pneumonia. Electronically Signed   By: Kerby Moors M.D.   On: 09/12/2021 10:38   ? ?PHYSICAL EXAM ? ?Physical Exam  ?Constitutional: Appears well-developed and well-nourished.  ?Cardiovascular: Normal rate and regular rhythm.  ?Respiratory: Effort normal, non-labored breathing ? ?Neuro: ?Mental Status: ?Patient is less drowsy today and will respond to name ?Nonsensical speech, does not follow commands consistently and mimics occasionally. Right weaker than left, holds left antigravity ?Cranial Nerves: ?II:  Pupils are equal, round, and reactive to light.   ?III,IV, VI: EOMI without ptosis or diploplia.  ?XI: Head is midline ?XII: Does not protrude tongue ?Motor: ?Tone is normal. Bulk is normal.  ?LUE, LLE 4/5 ?RUE 0/5, RLE 1/5  ?Right hemiplegia, Left antigravity ?Sensory: ?Sensation is symmetric to light touch in the arms and legs. No extinction to DSS present.  ? ? ?ASSESSMENT/PLAN ?[name] is a 61 y.o. male with history of HTN, smoking, esophageal varcies presenting after he was found down on the concrete porch of his home.  He has been on the phone speaking with a relative and she heard him fall and he was able to state that he was on the ground and could not get up family member stated that this happened at 1645 on 2/25 and the son was able to get there within 5 minutes, he then called EMS.  On arrival he had decreased alertness, right hemineglect,  dysarthric speech.  Hypertonic saline initiated for cerebral edema with mass effect. Hypertonic saline increased to 21ml/hr, most recent sodium 155. Repeat head CT performed 3/2 reveals unchanged IPH.  CIR signed off.  Will need long-term care placement. Transferred out of ICU. ? ?ICH:  Left BG ICH, likely secondary hypertension ?Head CT- Acute parenchymal hemorrhage involving left lentiform nucleus and surrounding white matter. Mild mass effect. ?Repeat CT- Interval increase in the size of the intraparenchymal hematoma centered in the left basal ganglia, with increased associated edema and 4 mm of left-to-right midline shift. ?MRI  Redemonstrated intraparenchymal hematoma centered in the left basal ganglia, which appears to have increased in size compared to the study  6 p.m. on 09/05/2021. There is slightly increasing mass effect on the left lateral ventricle and 4 mm of left-to-right midline shift. ?Repeat head CT 2/27 stable IPH and stable midline shift ?2D Echo EF [id number], grade 1 diastolic dysfunction ?LDL 94.3 ?A1c 5.3 ?UDS negative ?VTE prophylaxis -heparin subcu ?No antithrombotic prior to admission, now on No antithrombotic due to IPH ?Therapy recommendations:  SNF ?Disposition:  Pending ? ?Cerebral edema  ?increased mass effect on left lateral ventricle ?Hypertonic saline 5ml/hr- d/c'd ?Na- 138-> 140 -> 145 ->155 -> 159 -> 160 -> 162 -> 163 -> 154 -> 150 ?Reduce to  FW 100q4 today. ?Continue sodium checks, allow Na trending down spontaneously ? ?Hypertension ?Home meds:  Norvasc 5mg , increased Norvasc 10mg  ?Stable ?Keep SBP <160 ?Long-term BP goal normotensive ? ?Alcohol abuse ?Alcohol level 206 on presentation ?CIWA protocol initiated ?Folic acid, B1 and multivitamin ?Will advise to drink no more than 2 drink(s) a day ? ?Tobacco abuse ?Current smoker, 0.25ppd ?Smoking cessation counseling will be provided ? ?Other Stroke Risk Factors ? ? ?Other Active Problems ?Hypokalemia ?K 3.2->  replaced 20IV and  20PO ?Fever ? No further fevers. ?CXR shows no new infiltrates or consolidation ?Blood cultures drawn no growth ?Vitamin D deficiency ?50,000 IU per dietitian recommendation ? ?Hospital day # 7 ? ?Patient seen and examined by NP/APP with MD. MD to  update note as needed.  ? ?Joshua Price , MSN, AGACNP-BC ?[facility] ?See Amion for schedule and pager information ?09/12/2021 1:09 PM ? ?ATTENDING NOTE: ?I reviewed above note and agree with the assessment and plan. Pt was seen and examined.  ? ?61 year old male with history of hypertension, smoker, alcohol abuse and esophageal varices admitted for found down at home, right-sided neglect, slurred speech and altered mental status.  CT showed left BG ICH.  MRI and repeat CT stable with 4 mm midline shift.  EF 70 to 75%.  LDL 94.3, A1c 5.3.  UDS negative.  UA negative.  CT head and neck pending. ? ?On exam, brother at bedside, patient awake, alert, eyes open, global aphasia and not following commands. However, able to have sounds out but not make sense.  No gaze palsy, able to track bilaterally to sounds but not follow commands, not blinking to visual threat bilaterally, PERRL. Right facial droop. Tongue protrusion not cooperative. RUE and RLE flaccid and no movement on pain stimulation. LUE and LLE at least 4/5 with spontaneous movement. Sensation, coordination not cooperative and gait not tested. ? ?Etiology for patient ICH likely due to uncontrolled risk factors including hypertension, smoking and alcohol abuse.  Patient off 3% saline, sodium trending down with free water.  This morning sodium 150.  Decrease free water to 100 every 4, allow sodium trending down spontaneously.  BP under control, continue Norvasc metoprolol, long-term BP goal normotensive.  Continue tube feeding for now, speech on board, currently on dysphagia 1 and nectar thick liquid. ? ?For detailed assessment and plan, please refer to above as I have made changes wherever  appropriate.  ? ?Joshua Hawking, MD PhD ?Stroke Neurology ?09/12/2021 ?5:45 PM ? ? ? ?To contact Stroke Continuity provider, please refer to [email or web address] ?After hours, contact General Neurology ? ?

## 2021-09-12 NOTE — Assessment & Plan Note (Deleted)
Patient noted to be febrile overnight with a temperature of 101.4 ?F.  WBC is normal.  Not noted to be dyspneic on exam.  Abdomen is soft. ?Chest x-ray, UA and will do blood cultures if he spikes again. ?

## 2021-09-12 NOTE — Progress Notes (Signed)
[facility] PROGRESS NOTE   Joshua Price ZOX:[id number] DOB: 1960-08-09 DOA: 09/05/2021  7 DOS: the patient was seen and examined on 09/12/2021  PCP: Dartha Lodge, FNP  Brief History and Hospital Course:   61 y.o. male with history of HTN, smoking, esophageal varcies presenting after he was found down on the concrete porch of his home.  He has been on the phone speaking with a relative and she heard him fall and he was able to state that he was on the ground and could not get up family member stated that this happened at 1645 on 2/25 and the son was able to get there within 5 minutes, he then called EMS.  On arrival he had decreased alertness, right hemineglect, dysarthric speech.  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  He was admitted to the neuro ICU.  Started on hypertonic saline which was subsequently discontinued.  CT scans were repeated which showed stable findings.  Developed hypernatremia but seems to be improving.  Continues to have significant dysarthria as well as dysphagia.  Consultants: Neurology.  Procedures: Transthoracic echocardiogram.  Cortrack feeding tube placement    Subjective: Lying in the bed with eyes open.  Follows certain commands but mostly not very responsive.  Still quite dysarthric.     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 Patient was admitted to the neuro ICU.  Started on hypertonic saline.  CT head was repeated which showed stable findings with stable midline shift. Patient was also found to have cerebral edema. Echocardiogram shows normal systolic function with grade 1 diastolic dysfunction. Neurological status is stable.  Continues to have dense right hemiparesis.  Significant dysphagia.  PT and OT is following.  Plan is for skilled nursing facility placement when medically stable for  discharge.  Apparently not a good candidate for inpatient rehabilitation due to lack of family support.  Fever Patient noted to be febrile overnight with a temperature of 101.4 F.  WBC is normal.  Not noted to be dyspneic on exam.  Abdomen is soft. Chest x-ray, UA and will do blood cultures if he spikes again.  Hypernatremia Likely due to combination of poor oral intake as well as the fact that he was on hypertonic saline.  Sodium level peaked at 166.   Patient was started on free water.  Normal saline infusion was discontinued.  Sodium levels appear to be improving.  Continue free water for now.  No need to check labs every 6 hours.  We will recheck this evening and tomorrow morning.    Essential hypertension Initially presented with hypertensive emergency.  Currently on amlodipine and metoprolol.   Blood pressure seems to be better controlled in the last 48 hours.  Continue to monitor.  Oropharyngeal dysphagia Speech therapy is following.  Currently getting tube feedings.  Alcohol withdrawal (HCC) Started on CIWA protocol.  No active withdrawal symptoms noted currently.  Noted to be on thiamine multivitamins folic acid.  Malnutrition of moderate degree On tube feedings.  Hypokalemia Continue to replete.  Magnesium was 2.0.  Will give additional dose of potassium this afternoon.  Altered mental status Likely due to combination of stroke, hyponatremia, alcohol withdrawal.  Seems to be gradually improving.    DVT Prophylaxis: SCDs only for now Code Status: Full code Family Communication: No family at bedside.  We will try to reach his mother later today. Disposition Plan: Most likely  will need to go to skilled nursing facility  Status is: Inpatient Remains inpatient appropriate because: Acute intracranial hemorrhage      Medications: Scheduled:  amLODipine  10 mg Per Tube Daily   chlorhexidine  15 mL Mouth Rinse BID   Chlorhexidine Gluconate Cloth  6 each Topical Daily    cholecalciferol  1,000 Units Per Tube Daily   feeding supplement (PROSource TF)  45 mL Per Tube Daily   folic acid  1 mg Per Tube Daily   free water  400 mL Per Tube Q4H   insulin aspart  0-15 Units Subcutaneous Q4H   mouth rinse  15 mL Mouth Rinse q12n4p   metoprolol tartrate  25 mg Per Tube BID   multivitamin with minerals  1 tablet Per Tube Daily   pantoprazole sodium  40 mg Per Tube QHS   polyethylene glycol  17 g Per Tube Daily   potassium chloride  40 mEq Per Tube Daily   potassium chloride  40 mEq Oral Once   senna-docusate  1 tablet Per Tube BID   thiamine  500 mg Per Tube TID   Continuous:  feeding supplement (VITAL 1.5 CAL) 1,000 mL (09/11/21 0400)   ZOX:[id number]:acetaminophen **OR** acetaminophen (TYLENOL) oral liquid 160 mg/5 mL **OR** acetaminophen, hydrALAZINE, labetalol  Antibiotics: Anti-infectives (From admission, onward)    None       Objective:  Vital Signs  Vitals:   09/11/21 2311 09/12/21 0031 09/12/21 0300 09/12/21 0731  BP: 107/71  134/90 138/79  Pulse: 82 88 87 91  Resp: 20 18 20 18   Temp:  99.9 F (37.7 C) 99.3 F (37.4 C) 99 F (37.2 C)  TempSrc:  Oral Oral Oral  SpO2: 96% 95% 95% 94%  Weight:      Height:        Intake/Output Summary (Last 24 hours) at 09/12/2021 0844 Last data filed at 09/12/2021 0400 Gross per 24 hour  Intake 3350 ml  Output 1275 ml  Net 2075 ml    Filed Weights   09/09/21 0630 09/10/21 0500 09/11/21 2112  Weight: 69 kg 70.9 kg 72.2 kg    General appearance: Awake alert.  In no distress.  Distracted Resp: Clear to auscultation bilaterally.  Normal effort Cardio: S1-S2 is normal regular.  No S3-S4.  No rubs murmurs or bruit GI: Abdomen is soft.  Nontender nondistended.  Bowel sounds are present normal.  No masses organomegaly Extremities: No edema.   Neurologic: Right hemiparesis    Lab Results:  Data Reviewed: I have personally reviewed labs and imaging study reports  CBC: Recent Labs  Lab 09/05/21 1820  09/05/21 1904 09/07/21 0751 09/08/21 0615 09/09/21 0730 09/11/21 0637 09/12/21 0558  WBC 4.3  --  5.0 5.4 5.8 7.1 6.7  NEUTROABS 1.7  --   --   --   --   --   --   HGB 12.9*   < > 13.5 13.8 14.9 14.4 12.9*  HCT 38.0*   < > 38.8* 41.3 42.2 42.8 39.0  MCV 95.5  --  97.2 98.6 100.2* 100.2* 98.2  PLT 112*  --  121* 118* 121* 128* 128*   < > = values in this interval not displayed.     Basic Metabolic Panel: Recent Labs  Lab 09/07/21 0751 09/07/21 1250 09/08/21 0615 09/08/21 1132 09/09/21 0730 09/09/21 1150 09/09/21 1828 09/10/21 0511 09/10/21 1117 09/10/21 1632 09/11/21 0039 09/11/21 [id number] 09/11/21 1203 09/11/21 1736 09/11/21 2336 09/12/21 0558  NA 145   < >  154*   < > 161*   < > 160* 162* 163* 166*   < > 154* 155* 154* 151* 150*  K 3.3*  --  3.2*  --  3.0*  --   --   --  3.2*  --   --  3.2*  --   --   --  3.2*  CL 110  --  119*  --  128*  --   --   --  130*  --   --  122*  --   --   --  115*  CO2 26  --  24  --  22  --   --   --  23  --   --  23  --   --   --  24  GLUCOSE 142*  --  147*  --  125*  --   --   --  169*  --   --  166*  --   --   --  156*  BUN 6  --  <5*  --  6  --   --   --  13  --   --  12  --   --   --  17  CREATININE 0.87  --  0.97  --  1.00  --   --   --  1.33*  --   --  1.19  --   --   --  1.11  CALCIUM 8.8*  --  9.2  --  9.2  --   --   --  9.5  --   --  9.4  --   --   --  9.2  MG 2.2  --   --   --   --   --  1.9 2.2  --  2.1  --  2.0  --   --   --   --   PHOS 2.4*  --  2.6  --   --   --  4.3 4.2  --  3.7  --  3.8  --   --   --   --    < > = values in this interval not displayed.     GFR: Estimated Creatinine Clearance: 63.9 mL/min (by C-G formula based on SCr of 1.11 mg/dL).  Liver Function Tests: Recent Labs  Lab 09/05/21 1755  AST 93*  ALT 35  ALKPHOS 107  BILITOT 1.1  PROT 8.2*  ALBUMIN 3.5      Coagulation Profile: Recent Labs  Lab 09/05/21 1820  INR 1.1      CBG: Recent Labs  Lab 09/11/21 1509 09/11/21 1914  09/11/21 2343 09/12/21 0324 09/12/21 0730  GLUCAP 137* 171* 196* 142* 164*      Anemia Panel: Recent Labs    09/09/21 1316  VITAMINB12 1,384*  FOLATE 22.5     Recent Results (from the past 240 hour(s))  Resp Panel by RT-PCR (Flu A&B, Covid) Nasopharyngeal Swab     Status: None   Collection Time: 09/05/21  6:59 PM   Specimen: Nasopharyngeal Swab; Nasopharyngeal(NP) swabs in vial transport medium  Result Value Ref Range Status   SARS Coronavirus 2 by RT PCR NEGATIVE NEGATIVE Final    Comment: (NOTE) SARS-CoV-2 target nucleic acids are NOT DETECTED.  The SARS-CoV-2 RNA is generally detectable in upper respiratory specimens during the acute phase of infection. The lowest concentration of SARS-CoV-2 viral copies this assay can detect is 138 copies/mL. A negative result does not  preclude SARS-Cov-2 infection and should not be used as the sole basis for treatment or other patient management decisions. A negative result may occur with  improper specimen collection/handling, submission of specimen other than nasopharyngeal swab, presence of viral mutation(s) within the areas targeted by this assay, and inadequate number of viral copies(<138 copies/mL). A negative result must be combined with clinical observations, patient history, and epidemiological information. The expected result is Negative.  Fact Sheet for Patients:  BloggerCourse.com  Fact Sheet for Healthcare Providers:  SeriousBroker.it  This test is no t yet approved or cleared by the [place] FDA and  has been authorized for detection and/or diagnosis of SARS-CoV-2 by FDA under an Emergency Use Authorization (EUA). This EUA will remain  in effect (meaning this test can be used) for the duration of the COVID-19 declaration under Section 564(b)(1) of the Act, 21 U.S.C.section 360bbb-3(b)(1), unless the authorization is terminated  or revoked sooner.        Influenza A by PCR NEGATIVE NEGATIVE Final   Influenza B by PCR NEGATIVE NEGATIVE Final    Comment: (NOTE) The Xpert Xpress SARS-CoV-2/FLU/RSV plus assay is intended as an aid in the diagnosis of influenza from Nasopharyngeal swab specimens and should not be used as a sole basis for treatment. Nasal washings and aspirates are unacceptable for Xpert Xpress SARS-CoV-2/FLU/RSV testing.  Fact Sheet for Patients: BloggerCourse.com  Fact Sheet for Healthcare Providers: SeriousBroker.it  This test is not yet approved or cleared by the [place] FDA and has been authorized for detection and/or diagnosis of SARS-CoV-2 by FDA under an Emergency Use Authorization (EUA). This EUA will remain in effect (meaning this test can be used) for the duration of the COVID-19 declaration under Section 564(b)(1) of the Act, 21 U.S.C. section 360bbb-3(b)(1), unless the authorization is terminated or revoked.  Performed at [facility], [address], [place], [place] [address]   MRSA Next Gen by PCR, Nasal     Status: None   Collection Time: 09/05/21 10:06 PM   Specimen: Nasal Mucosa; Nasal Swab  Result Value Ref Range Status   MRSA by PCR Next Gen NOT DETECTED NOT DETECTED Final    Comment: (NOTE) The GeneXpert MRSA Assay (FDA approved for NASAL specimens only), is one component of a comprehensive MRSA colonization surveillance program. It is not intended to diagnose MRSA infection nor to guide or monitor treatment for MRSA infections. Test performance is not FDA approved in patients less than 32 years old. Performed at [facility], [address] [place], [place] [address]   Culture, blood (routine x 2)     Status: None (Preliminary result)   Collection Time: 09/08/21  6:05 AM   Specimen: BLOOD RIGHT HAND  Result Value Ref Range Status   Specimen Description BLOOD RIGHT HAND  Final   Special Requests   Final    BOTTLES DRAWN  AEROBIC AND ANAEROBIC Blood Culture results may not be optimal due to an excessive volume of blood received in culture bottles   Culture   Final    NO GROWTH 3 DAYS Performed at [facility], [address], [place], [place] [address]    Report Status PENDING  Incomplete  Culture, blood (routine x 2)     Status: None (Preliminary result)   Collection Time: 09/08/21  6:14 AM   Specimen: BLOOD LEFT HAND  Result Value Ref Range Status   Specimen Description BLOOD LEFT HAND  Final   Special Requests  Final    BOTTLES DRAWN AEROBIC AND ANAEROBIC Blood Culture adequate volume   Culture   Final    NO GROWTH 3 DAYS Performed at [facility], [address], [place], [place] [address]    Report Status PENDING  Incomplete       Radiology Studies: CT HEAD WO CONTRAST ( )  Result Date: 09/10/2021 CLINICAL DATA:  Follow-up intracranial hemorrhage EXAM: CT HEAD WITHOUT CONTRAST TECHNIQUE: Contiguous axial images were obtained from the base of the skull through the vertex without intravenous contrast. RADIATION DOSE REDUCTION: This exam was performed according to the departmental dose-optimization program which includes automated exposure control, adjustment of the mA and/or kV according to patient size and/or use of iterative reconstruction technique. COMPARISON:  09/07/2021 FINDINGS: Brain: No change in size of the intraparenchymal hemorrhage within the left hemisphere with the epicenter at the basal ganglia/radiating white matter tracts measuring 5.7 x 2.5 x 4.2 cm (volume = 31 cm^3). Mild surrounding edema. Mild mass effect with left-to-right shift of only 1 mm. No sign of new insult. No sign of cortical ischemic infarction. No hydrocephalus or extra-axial collection. Vascular: There is atherosclerotic calcification of the major vessels at the base of the brain. Skull: Negative Sinuses/Orbits: Clear/normal Other: None IMPRESSION: No change since the study of 09/07/2021. Intraparenchymal  hematoma within the left basal ganglia/radiating white matter tract region, estimated volume 31 cc as seen previously. Electronically Signed   By: Paulina Fusi M.D.   On: 09/10/2021 13:23       LOS: 7 days   Haniyah Maciolek Rito Ehrlich  [facility] Pager on [email or web address]  09/12/2021, 8:44 AM

## 2021-09-13 DIAGNOSIS — J690 Pneumonitis due to inhalation of food and vomit: Secondary | ICD-10-CM | POA: Diagnosis not present

## 2021-09-13 DIAGNOSIS — G936 Cerebral edema: Secondary | ICD-10-CM

## 2021-09-13 LAB — CBC
HCT: 37.9 % — ABNORMAL LOW (ref 39.0–52.0)
Hemoglobin: 12.4 g/dL — ABNORMAL LOW (ref 13.0–17.0)
MCH: 32.7 pg (ref 26.0–34.0)
MCHC: 32.7 g/dL (ref 30.0–36.0)
MCV: 100 fL (ref 80.0–100.0)
Platelets: 136 10*3/uL — ABNORMAL LOW (ref 150–400)
RBC: 3.79 MIL/uL — ABNORMAL LOW (ref 4.22–5.81)
RDW: 13.7 % (ref 11.5–15.5)
WBC: 7.2 10*3/uL (ref 4.0–10.5)
nRBC: 0 % (ref 0.0–0.2)

## 2021-09-13 LAB — BASIC METABOLIC PANEL
Anion gap: 9 (ref 5–15)
BUN: 18 mg/dL (ref 6–20)
CO2: 24 mmol/L (ref 22–32)
Calcium: 9.2 mg/dL (ref 8.9–10.3)
Chloride: 115 mmol/L — ABNORMAL HIGH (ref 98–111)
Creatinine, Ser: 1.21 mg/dL (ref 0.61–1.24)
GFR, Estimated: >60 mL/min (ref >60)
Glucose, Bld: 155 mg/dL — ABNORMAL HIGH (ref 70–99)
Potassium: 3.6 mmol/L (ref 3.5–5.1)
Sodium: 148 mmol/L — ABNORMAL HIGH (ref 135–145)

## 2021-09-13 LAB — CULTURE, BLOOD (ROUTINE X 2)
Culture: NO GROWTH
Culture: NO GROWTH
Special Requests: ADEQUATE

## 2021-09-13 LAB — GLUCOSE, CAPILLARY
Glucose-Capillary: 152 mg/dL — ABNORMAL HIGH (ref 70–99)
Glucose-Capillary: 187 mg/dL — ABNORMAL HIGH (ref 70–99)
Glucose-Capillary: 236 mg/dL — ABNORMAL HIGH (ref 70–99)
Glucose-Capillary: 237 mg/dL — ABNORMAL HIGH (ref 70–99)
Glucose-Capillary: 251 mg/dL — ABNORMAL HIGH (ref 70–99)
Glucose-Capillary: 216 mg/dL — ABNORMAL HIGH (ref 70–99)

## 2021-09-13 MED ORDER — LIP MEDEX EX OINT
TOPICAL_OINTMENT | CUTANEOUS | Status: DC | PRN
  Administered 2021-09-27: 75 via TOPICAL
  Filled 2021-09-13: qty 7

## 2021-09-13 MED ORDER — DIPHENHYDRAMINE HCL 25 MG PO CAPS
25.0000 mg | ORAL_CAPSULE | Freq: Once | ORAL | Status: AC
  Administered 2021-09-13: 25 mg via ORAL
  Filled 2021-09-13: qty 1

## 2021-09-13 MED ORDER — LOPERAMIDE HCL 2 MG PO CAPS: 4.0000 mg | ORAL_CAPSULE | Freq: Three times a day (TID) | ORAL | Status: DC | PRN

## 2021-09-13 NOTE — Assessment & Plan Note (Addendum)
Patient developed fever on 3/3.  UA was unremarkable and not suggestive of infection.  Chest x-ray showed left lower lung infiltrate.  Patient with significant oropharyngeal dysphagia.  Likely aspirated.  Started on Augmentin.   -s/p 5-day course. -may have aspirated again-- may need to resume if spikes temperature

## 2021-09-13 NOTE — Progress Notes (Signed)
[facility] PROGRESS NOTE   Joshua Price XKG:[id number] DOB: [birth year] DOA: 09/05/2021  8 DOS: the patient was seen and examined on 09/13/2021  PCP: Dartha Lodge, FNP  Brief History and Hospital Course:  61 y.o. male with history of HTN, smoking, esophageal varcies presenting after he was found down on the concrete porch of his home.  He has been on the phone speaking with a relative and she heard him fall and he was able to state that he was on the ground and could not get up family member stated that this happened at 1645 on 2/25 and the son was able to get there within 5 minutes, he then called EMS.  On arrival he had decreased alertness, right hemineglect, dysarthric speech.  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  He was admitted to the neuro ICU.  Started on hypertonic saline which was subsequently discontinued.  CT scans were repeated which showed stable findings.  Developed hypernatremia but seems to be improving.  Continues to have significant dysarthria as well as dysphagia.  Consultants: Neurology.  Procedures: Transthoracic echocardiogram.  Cortrack feeding tube placement    Subjective: Patient trying to communicate but due to his significant dysarthria it is ineffective.  Does not appear to be in any discomfort.     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  Cerebral edema was noted on imaging studies. Patient was admitted to the neuro ICU.  Started on hypertonic saline.  CT head was repeated which showed stable findings with stable midline shift. Echocardiogram shows normal systolic function with grade 1 diastolic dysfunction. Neurology continues to follow. Neurological status is stable.  Continues to have dense right hemiparesis and dysarthria. PT and OT is following.  Apparently not a good candidate for inpatient  rehabilitation due to lack of family support.  Will eventually need to go to skilled nursing facility for rehab.  Aspiration pneumonia [facility]) Patient developed fever on 3/3.  UA was unremarkable and not suggestive of infection.  Chest x-ray showed left lower lung infiltrate.  Patient with significant oropharyngeal dysphagia.  Likely aspirated.  Started on Augmentin.  Continue to monitor.  Respiratory status is stable.  Hypernatremia Likely due to combination of poor oral intake as well as the fact that he was on hypertonic saline.  Sodium level peaked at 166.   Patient was started on free water.  Normal saline infusion was discontinued.   Sodium level gradually improving.  Down to 148.  Free water dose has been decreased.    Altered mental status Likely due to combination of stroke, hyponatremia, alcohol withdrawal.  Seems to be gradually improving.  Essential hypertension Initially presented with hypertensive emergency.  Currently on amlodipine and metoprolol.   Blood pressure is better controlled now.  Oropharyngeal dysphagia Speech therapy is following.  Currently getting tube feedings. He is on a dysphagia 1 diet.  Will need to ensure that he is able to take adequate calories by mouth before discontinuing the feeding tube. We will consider initiating calorie count in the next 24 to 48 hours if his diet is advanced further.  Alcohol withdrawal (HCC) He was placed on CIWA protocol.  Does not have any withdrawal symptoms currently.   Noted to be on thiamine multivitamins folic acid.  Malnutrition of moderate degree On tube feedings.  Hypokalemia Potassium has been repleted.  Magnesium was 2.0.    DVT Prophylaxis: SCDs only  for now Code Status: Full code Family Communication: No family at bedside.  Discussed in detail with patient's mother yesterday. Disposition Plan: Most likely will need to go to skilled nursing facility when medically stable  Status is: Inpatient Remains  inpatient appropriate because: Acute intracranial hemorrhage      Medications: Scheduled:  amLODipine  10 mg Per Tube Daily   amoxicillin-clavulanate  875 mg Per Tube Q12H   chlorhexidine  15 mL Mouth Rinse BID   Chlorhexidine Gluconate Cloth  6 each Topical Daily   cholecalciferol  1,000 Units Per Tube Daily   feeding supplement (PROSource TF)  45 mL Per Tube Daily   folic acid  1 mg Per Tube Daily   free water  100 mL Per Tube Q4H   heparin injection (subcutaneous)  5,000 Units Subcutaneous Q8H   insulin aspart  0-15 Units Subcutaneous Q4H   mouth rinse  15 mL Mouth Rinse q12n4p   metoprolol tartrate  25 mg Per Tube BID   multivitamin with minerals  1 tablet Per Tube Daily   pantoprazole sodium  40 mg Per Tube QHS   polyethylene glycol  17 g Per Tube Daily   potassium chloride  40 mEq Per Tube Daily   senna-docusate  1 tablet Per Tube BID   thiamine  500 mg Per Tube TID   Continuous:  feeding supplement (VITAL 1.5 CAL) 1,000 mL (09/11/21 0400)   [id number] **OR** acetaminophen (TYLENOL) oral liquid 160 mg/5 mL **OR** acetaminophen, hydrALAZINE, labetalol  Antibiotics: Anti-infectives (From admission, onward)    Start     Dose/Rate Route Frequency Ordered Stop   09/12/21 1215  amoxicillin-clavulanate (AUGMENTIN) 400-57 MG/5ML suspension 875 mg       Note to Pharmacy: Please adjust dose as indicated. For aspiration pna. thanks   875 mg Per Tube Every 12 hours 09/12/21 1117         Objective:  Vital Signs  Vitals:   09/12/21 2200 09/12/21 2300 09/13/21 0327 09/13/21 0803  BP: 110/79 111/68 113/80 135/82  Pulse: 77 78 72 77  Resp:   20 16  Temp:  (!) 100.5 F (38.1 C) 98.2 F (36.8 C) 99.1 F (37.3 C)  TempSrc:  Oral Axillary Oral  SpO2: 94% 92% 96% 98%  Weight:      Height:        Intake/Output Summary (Last 24 hours) at 09/13/2021 0855 Last data filed at 09/13/2021 0803 Gross per 24 hour  Intake --  Output 1126 ml  Net -1126 ml    Filed Weights    09/09/21 0630 09/10/21 0500 09/11/21 2112  Weight: 69 kg 70.9 kg 72.2 kg    General appearance: Awake alert.  In no distress Resp: Clear to auscultation bilaterally.  Normal effort Cardio: S1-S2 is normal regular.  No S3-S4.  No rubs murmurs or bruit GI: Abdomen is soft.  Nontender nondistended.  Bowel sounds are present normal.  No masses organomegaly Extremities: No edema.   Neurologic: Right hemiparesis   Lab Results:  Data Reviewed: I have personally reviewed labs and imaging study reports  CBC: Recent Labs  Lab 09/08/21 0615 09/09/21 0730 09/11/21 0637 09/12/21 0558 09/13/21 0302  WBC 5.4 5.8 7.1 6.7 7.2  HGB 13.8 14.9 14.4 12.9* 12.4*  HCT 41.3 42.2 42.8 39.0 37.9*  MCV 98.6 100.2* 100.2* 98.2 100.0  PLT 118* 121* 128* 128* 136*     Basic Metabolic Panel: Recent Labs  Lab 09/07/21 0751 09/07/21 1250 09/08/21 0615 09/08/21 1132 09/09/21 0730 09/09/21  1150 09/09/21 1828 09/10/21 0511 09/10/21 1117 09/10/21 1632 09/11/21 0039 09/11/21 1610 09/11/21 1203 09/11/21 1736 09/11/21 2336 09/12/21 0558 09/12/21 1527 09/13/21 0302  NA 145   < > 154*   < > 161*   < > 160* 162* 163* 166*   < > 154*   < > 154* 151* 150* 150* 148*  K 3.3*  --  3.2*  --  3.0*  --   --   --  3.2*  --   --  3.2*  --   --   --  3.2*  --  3.6  CL 110  --  119*  --  128*  --   --   --  130*  --   --  122*  --   --   --  115*  --  115*  CO2 26  --  24  --  22  --   --   --  23  --   --  23  --   --   --  24  --  24  GLUCOSE 142*  --  147*  --  125*  --   --   --  169*  --   --  166*  --   --   --  156*  --  155*  BUN 6  --  <5*  --  6  --   --   --  13  --   --  12  --   --   --  17  --  18  CREATININE 0.87  --  0.97  --  1.00  --   --   --  1.33*  --   --  1.19  --   --   --  1.11  --  1.21  CALCIUM 8.8*  --  9.2  --  9.2  --   --   --  9.5  --   --  9.4  --   --   --  9.2  --  9.2  MG 2.2  --   --   --   --   --  1.9 2.2  --  2.1  --  2.0  --   --   --   --   --   --   PHOS 2.4*  --   2.6  --   --   --  4.3 4.2  --  3.7  --  3.8  --   --   --   --   --   --    < > = values in this interval not displayed.     GFR: Estimated Creatinine Clearance: 58.6 mL/min (by C-G formula based on SCr of 1.21 mg/dL).  Liver Function Tests: No results for input(s): AST, ALT, ALKPHOS, BILITOT, PROT, ALBUMIN in the last 168 hours.    Coagulation Profile: No results for input(s): INR, PROTIME in the last 168 hours.    CBG: Recent Labs  Lab 09/12/21 1528 09/12/21 2001 09/12/21 2314 09/13/21 0324 09/13/21 0751  GLUCAP 127* 198* 208* 152* 187*      Recent Results (from the past 240 hour(s))  Resp Panel by RT-PCR (Flu A&B, Covid) Nasopharyngeal Swab     Status: None   Collection Time: 09/05/21  6:59 PM   Specimen: Nasopharyngeal Swab; Nasopharyngeal(NP) swabs in vial transport medium  Result Value Ref Range Status   SARS Coronavirus 2 by RT PCR NEGATIVE NEGATIVE Final  Comment: (NOTE) SARS-CoV-2 target nucleic acids are NOT DETECTED.  The SARS-CoV-2 RNA is generally detectable in upper respiratory specimens during the acute phase of infection. The lowest concentration of SARS-CoV-2 viral copies this assay can detect is 138 copies/mL. A negative result does not preclude SARS-Cov-2 infection and should not be used as the sole basis for treatment or other patient management decisions. A negative result may occur with  improper specimen collection/handling, submission of specimen other than nasopharyngeal swab, presence of viral mutation(s) within the areas targeted by this assay, and inadequate number of viral copies(<138 copies/mL). A negative result must be combined with clinical observations, patient history, and epidemiological information. The expected result is Negative.  Fact Sheet for Patients:  BloggerCourse.com  Fact Sheet for Healthcare Providers:  SeriousBroker.it  This test is no t yet approved or  cleared by the [place] FDA and  has been authorized for detection and/or diagnosis of SARS-CoV-2 by FDA under an Emergency Use Authorization (EUA). This EUA will remain  in effect (meaning this test can be used) for the duration of the COVID-19 declaration under Section 564(b)(1) of the Act, 21 U.S.C.section 360bbb-3(b)(1), unless the authorization is terminated  or revoked sooner.       Influenza A by PCR NEGATIVE NEGATIVE Final   Influenza B by PCR NEGATIVE NEGATIVE Final    Comment: (NOTE) The Xpert Xpress SARS-CoV-2/FLU/RSV plus assay is intended as an aid in the diagnosis of influenza from Nasopharyngeal swab specimens and should not be used as a sole basis for treatment. Nasal washings and aspirates are unacceptable for Xpert Xpress SARS-CoV-2/FLU/RSV testing.  Fact Sheet for Patients: BloggerCourse.com  Fact Sheet for Healthcare Providers: SeriousBroker.it  This test is not yet approved or cleared by the [place] FDA and has been authorized for detection and/or diagnosis of SARS-CoV-2 by FDA under an Emergency Use Authorization (EUA). This EUA will remain in effect (meaning this test can be used) for the duration of the COVID-19 declaration under Section 564(b)(1) of the Act, 21 U.S.C. section 360bbb-3(b)(1), unless the authorization is terminated or revoked.  Performed at [facility], [address], [place], [place] [address]   MRSA Next Gen by PCR, Nasal     Status: None   Collection Time: 09/05/21 10:06 PM   Specimen: Nasal Mucosa; Nasal Swab  Result Value Ref Range Status   MRSA by PCR Next Gen NOT DETECTED NOT DETECTED Final    Comment: (NOTE) The GeneXpert MRSA Assay (FDA approved for NASAL specimens only), is one component of a comprehensive MRSA colonization surveillance program. It is not intended to diagnose MRSA infection nor to guide or monitor treatment for MRSA infections. Test  performance is not FDA approved in patients less than 56 years old. Performed at [facility], [address] [place], [place] [address]   Culture, blood (routine x 2)     Status: None (Preliminary result)   Collection Time: 09/08/21  6:05 AM   Specimen: BLOOD RIGHT HAND  Result Value Ref Range Status   Specimen Description BLOOD RIGHT HAND  Final   Special Requests   Final    BOTTLES DRAWN AEROBIC AND ANAEROBIC Blood Culture results may not be optimal due to an excessive volume of blood received in culture bottles   Culture   Final    NO GROWTH 4 DAYS Performed at [facility], [address], [place], [place] [address]    Report Status PENDING  Incomplete  Culture, blood (routine  x 2)     Status: None (Preliminary result)   Collection Time: 09/08/21  6:14 AM   Specimen: BLOOD LEFT HAND  Result Value Ref Range Status   Specimen Description BLOOD LEFT HAND  Final   Special Requests   Final    BOTTLES DRAWN AEROBIC AND ANAEROBIC Blood Culture adequate volume   Culture   Final    NO GROWTH 4 DAYS Performed at [facility], [address] [place], [place] [id number]    Report Status PENDING  Incomplete       Radiology Studies: CT ANGIO HEAD NECK W WO CM  Result Date: 09/13/2021 CLINICAL DATA:  Hemorrhagic stroke EXAM: CT ANGIOGRAPHY HEAD AND NECK TECHNIQUE: Multidetector CT imaging of the head and neck was performed using the standard protocol during bolus administration of intravenous contrast. Multiplanar CT image reconstructions and MIPs were obtained to evaluate the vascular anatomy. Carotid stenosis measurements (when applicable) are obtained utilizing NASCET criteria, using the distal internal carotid diameter as the denominator. RADIATION DOSE REDUCTION: This exam was performed according to the departmental dose-optimization program which includes automated exposure control, adjustment of the mA and/or kV according to patient size and/or use of iterative  reconstruction technique. CONTRAST:  75mL OMNIPAQUE IOHEXOL 350 MG/ML SOLN COMPARISON:  None. FINDINGS: CT HEAD FINDINGS Brain: Unchanged size of left basal ganglia intraparenchymal hematoma. No new mass effect. Mild persistent mass effect on the left lateral ventricle without hydrocephalus. The size and configuration of the ventricles and extra-axial CSF spaces are normal. There is no acute or chronic infarction. The brain parenchyma is normal. Skull: The visualized skull base, calvarium and extracranial soft tissues are normal. Sinuses/Orbits: No fluid levels or advanced mucosal thickening of the visualized paranasal sinuses. No mastoid or middle ear effusion. The orbits are normal. CTA NECK FINDINGS SKELETON: There is no bony spinal canal stenosis. No lytic or blastic lesion. OTHER NECK: Normal pharynx, larynx and major salivary glands. No cervical lymphadenopathy. Unremarkable thyroid gland. UPPER CHEST: No pneumothorax or pleural effusion. No nodules or masses. AORTIC ARCH: There is calcific atherosclerosis of the aortic arch. There is no aneurysm, dissection or hemodynamically significant stenosis of the visualized portion of the aorta. Conventional 3 vessel aortic branching pattern. The visualized proximal subclavian arteries are widely patent. RIGHT CAROTID SYSTEM: No dissection, occlusion or aneurysm. Mild atherosclerotic calcification at the carotid bifurcation without hemodynamically significant stenosis. LEFT CAROTID SYSTEM: No dissection, occlusion or aneurysm. Mild atherosclerotic calcification at the carotid bifurcation without hemodynamically significant stenosis. VERTEBRAL ARTERIES: Codominant configuration. Both origins are clearly patent. There is no dissection, occlusion or flow-limiting stenosis to the skull base (V1-V3 segments). CTA HEAD FINDINGS POSTERIOR CIRCULATION: --Vertebral arteries: Normal V4 segments. --Inferior cerebellar arteries: Normal. --Basilar artery: Normal. --Superior  cerebellar arteries: Normal. --Posterior cerebral arteries (PCA): Normal. ANTERIOR CIRCULATION: --Intracranial internal carotid arteries: Normal. --Anterior cerebral arteries (ACA): Normal. Both A1 segments are present. Patent anterior communicating artery (a-comm). --Middle cerebral arteries (MCA): Normal. VENOUS SINUSES: As permitted by contrast timing, patent. ANATOMIC VARIANTS: None Review of the MIP images confirms the above findings. IMPRESSION: 1. 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 2. No emergent large vessel occlusion or hemodynamically significant stenosis of the head or neck. Aortic Atherosclerosis (ICD10-I70.0). Electronically Signed   By: Deatra RobinsonKevin  Herman M.D.   On: 09/13/2021 00:04   DG CHEST PORT 1 VIEW  Result Date: 09/12/2021 CLINICAL DATA:  Intracranial hematoma.  Fevers. EXAM: PORTABLE CHEST 1 VIEW COMPARISON:  09/08/2021 FINDINGS: Feeding tube tip is well  below the level of the hemidiaphragms. Heart size is stable. No pleural effusion or edema identified. Mild patchy opacity within the left lower lobe is new compared with previous exam. Right lung is clear. IMPRESSION: New patchy opacity within the left lower lobe which may represent atelectasis or pneumonia. Electronically Signed   By: Signa Kellaylor  Stroud M.D.   On: 09/12/2021 10:38       LOS: 8 days   Joshua Price  [facility] Pager on [email or web address]  09/13/2021, 8:55 AM

## 2021-09-13 NOTE — Progress Notes (Addendum)
STROKE TEAM PROGRESS NOTE   INTERVAL HISTORY Patient is seen in his room with no family at the bedside.  He has been hemodynamically stable overnight but continues to be febrile.  His neurological exam is stable and his Na is slowly decreasing.  Vitals:   09/12/21 2200 09/12/21 2300 09/13/21 0327 09/13/21 0803  BP: 110/79 111/68 113/80 135/82  Pulse: 77 78 72 77  Resp:   20 16  Temp:  (!) 100.5 F (38.1 C) 98.2 F (36.8 C) 99.1 F (37.3 C)  TempSrc:  Oral Axillary Oral  SpO2: 94% 92% 96% 98%  Weight:      Height:       CBC:  Recent Labs  Lab 09/12/21 0558 09/13/21 0302  WBC 6.7 7.2  HGB 12.9* 12.4*  HCT 39.0 37.9*  MCV 98.2 100.0  PLT 128* 136*    Basic Metabolic Panel:  Recent Labs  Lab 09/10/21 1632 09/11/21 0039 09/11/21 0637 09/11/21 1203 09/12/21 0558 09/12/21 1527 09/13/21 0302  NA 166*   < > 154*   < > 150* 150* 148*  K  --   --  3.2*  --  3.2*  --  3.6  CL  --   --  122*  --  115*  --  115*  CO2  --   --  23  --  24  --  24  GLUCOSE  --   --  166*  --  156*  --  155*  BUN  --   --  12  --  17  --  18  CREATININE  --   --  1.19  --  1.11  --  1.21  CALCIUM  --   --  9.4  --  9.2  --  9.2  MG 2.1  --  2.0  --   --   --   --   PHOS 3.7  --  3.8  --   --   --   --    < > = values in this interval not displayed.    Lipid Panel:  Recent Labs  Lab 09/07/21 0100 09/08/21 0615  CHOL 255*  --   TRIG 678* 287*  HDL 34*  --   CHOLHDL 7.5  --   VLDL UNABLE TO CALCULATE IF TRIGLYCERIDE OVER 400 mg/dL  --   LDLCALC UNABLE TO CALCULATE IF TRIGLYCERIDE OVER 400 mg/dL  --     HgbA1c:  Recent Labs  Lab 09/07/21 0100  HGBA1C 5.3    Urine Drug Screen:  No results for input(s): LABOPIA, COCAINSCRNUR, LABBENZ, AMPHETMU, THCU, LABBARB in the last 168 hours.   Alcohol Level  No results for input(s): ETH in the last 168 hours.   IMAGING past 24 hours CT ANGIO HEAD NECK W WO CM  Result Date: 09/13/2021 CLINICAL DATA:  Hemorrhagic stroke EXAM: CT  ANGIOGRAPHY HEAD AND NECK TECHNIQUE: Multidetector CT imaging of the head and neck was performed using the standard protocol during bolus administration of intravenous contrast. Multiplanar CT image reconstructions and MIPs were obtained to evaluate the vascular anatomy. Carotid stenosis measurements (when applicable) are obtained utilizing NASCET criteria, using the distal internal carotid diameter as the denominator. RADIATION DOSE REDUCTION: This exam was performed according to the departmental dose-optimization program which includes automated exposure control, adjustment of the mA and/or kV according to patient size and/or use of iterative reconstruction technique. CONTRAST:  56mL OMNIPAQUE IOHEXOL 350 MG/ML SOLN COMPARISON:  None. FINDINGS: CT HEAD FINDINGS Brain: Unchanged  size of left basal ganglia intraparenchymal hematoma. No new mass effect. Mild persistent mass effect on the left lateral ventricle without hydrocephalus. The size and configuration of the ventricles and extra-axial CSF spaces are normal. There is no acute or chronic infarction. The brain parenchyma is normal. Skull: The visualized skull base, calvarium and extracranial soft tissues are normal. Sinuses/Orbits: No fluid levels or advanced mucosal thickening of the visualized paranasal sinuses. No mastoid or middle ear effusion. The orbits are normal. CTA NECK FINDINGS SKELETON: There is no bony spinal canal stenosis. No lytic or blastic lesion. OTHER NECK: Normal pharynx, larynx and major salivary glands. No cervical lymphadenopathy. Unremarkable thyroid gland. UPPER CHEST: No pneumothorax or pleural effusion. No nodules or masses. AORTIC ARCH: There is calcific atherosclerosis of the aortic arch. There is no aneurysm, dissection or hemodynamically significant stenosis of the visualized portion of the aorta. Conventional 3 vessel aortic branching pattern. The visualized proximal subclavian arteries are widely patent. RIGHT CAROTID SYSTEM: No  dissection, occlusion or aneurysm. Mild atherosclerotic calcification at the carotid bifurcation without hemodynamically significant stenosis. LEFT CAROTID SYSTEM: No dissection, occlusion or aneurysm. Mild atherosclerotic calcification at the carotid bifurcation without hemodynamically significant stenosis. VERTEBRAL ARTERIES: Codominant configuration. Both origins are clearly patent. There is no dissection, occlusion or flow-limiting stenosis to the skull base (V1-V3 segments). CTA HEAD FINDINGS POSTERIOR CIRCULATION: --Vertebral arteries: Normal V4 segments. --Inferior cerebellar arteries: Normal. --Basilar artery: Normal. --Superior cerebellar arteries: Normal. --Posterior cerebral arteries (PCA): Normal. ANTERIOR CIRCULATION: --Intracranial internal carotid arteries: Normal. --Anterior cerebral arteries (ACA): Normal. Both A1 segments are present. Patent anterior communicating artery (a-comm). --Middle cerebral arteries (MCA): Normal. VENOUS SINUSES: As permitted by contrast timing, patent. ANATOMIC VARIANTS: None Review of the MIP images confirms the above findings. IMPRESSION: 1. 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 2. No emergent large vessel occlusion or hemodynamically significant stenosis of the head or neck. Aortic Atherosclerosis (ICD10-I70.0). Electronically Signed   By: Ulyses Jarred M.D.   On: 09/13/2021 00:04    PHYSICAL EXAM  Physical Exam  Constitutional: Appears well-developed and well-nourished.  Cardiovascular: Normal rate and regular rhythm.  Respiratory: Effort normal, non-labored breathing  Neuro: Mental Status: Patient is less drowsy today and will respond to name Nonsensical speech, does not follow commands consistently and mimics occasionally. Right weaker than left, holds left antigravity Cranial Nerves: II:  Pupils are equal, round, and reactive to light.   III,IV, VI: EOMI without ptosis or diploplia.  XI: Head is midline XII: Does not  protrude tongue Motor: Tone is normal. Bulk is normal.  LUE, LLE 4/5 RUE 0/5, RLE 1/5  Right hemiplegia, Left antigravity Sensory: Sensation is symmetric to light touch in the arms and legs.     ASSESSMENT/PLAN Joshua Price is a 61 y.o. male with history of HTN, smoking, esophageal varcies presenting after he was found down on the concrete porch of his home.  He has been on the phone speaking with a relative and she heard him fall and he was able to state that he was on the ground and could not get up family member stated that this happened at 1645 on 2/25 and the son was able to get there within 5 minutes, he then called EMS.  On arrival he had decreased alertness, right hemineglect, dysarthric speech.  Hypertonic saline initiated for cerebral edema with mass effect. Hypertonic saline increased to 64ml/hr, most recent sodium 155. Repeat head CT performed 3/2 reveals unchanged IPH.  CIR signed off.  Will need long-term  care placement. Transferred out of ICU.  Stroke team will sign off at this time but will remain available for any questions or concerns.  ICH:  Left BG ICH, likely secondary hypertension Head CT- Acute parenchymal hemorrhage involving left lentiform nucleus and surrounding white matter. Mild mass effect. Repeat CT- Interval increase in the size of the intraparenchymal hematoma centered in the left basal ganglia, with increased associated edema and 4 mm of left-to-right midline shift. MRI  Redemonstrated intraparenchymal hematoma centered in the left basal ganglia, which appears to have increased in size compared to the study  6 p.m. on 09/05/2021. There is slightly increasing mass effect on the left lateral ventricle and 4 mm of left-to-right midline shift. Repeat head CT 2/27 stable IPH and stable midline shift CTA 3/4 shows stable ICH, no significant stenosis and no aneurysm 2D Echo EF [id number], grade 1 diastolic dysfunction LDL [id number] A1c 5.3 UDS negative VTE prophylaxis  -heparin subcu No antithrombotic prior to admission, now on No antithrombotic due to IPH Therapy recommendations:  SNF Disposition:  Pending  Cerebral edema  increased mass effect on left lateral ventricle Hypertonic saline 45ml/hr- d/c'd Na- 138-> 140 -> 145 ->155 -> 159 -> 160 -> 162 -> 163 -> 154 -> 150->148 Reduced FW to 100q4  Allow Na trending down spontaneously  Hypertension Home meds:  Norvasc 5mg , increased Norvasc 10mg  Stable Keep SBP <160 Long-term BP goal normotensive  Hyperlipidemia Home meds none LDL 94.3, goal less than 70 May consider low-dose statin on discharge  Alcohol abuse Alcohol level 206 on presentation CIWA protocol initiated Folic acid, B1 and multivitamin Will advise to drink no more than 2 drink(s) a day  Tobacco abuse Current smoker, 0.25ppd Smoking cessation counseling will be provided  Other Stroke Risk Factors   Other Active Problems Hypokalemia K 3.2->  replaced 20IV and 20PO Fever Intermittent low grade fever CXR shows no new infiltrates or consolidation Blood cultures drawn no growth Vitamin D deficiency 50,000 IU per dietitian recommendation  Hospital day # 8  Patient seen and examined by NP/APP with MD. MD to update note as needed.   [place] , MSN, AGACNP-BC [facility] See Amion for schedule and pager information 09/13/2021 11:04 AM  ATTENDING NOTE: I reviewed above note and agree with the assessment and plan. Pt was seen and examined.   No family at bedside.  Patient initially sleeping, however arousable, still has global aphasia, not following commands, right hemiplegia.  CTA head and neck overnight showed no AVM or aneurysm.  Sodium 148, trending down slowly.  Still has intermittent low-grade fever, management per primary team.  BP under control.  PT/OT recommend SNF.  For detailed assessment and plan, please refer to above as I have made changes wherever appropriate.   Neurology will sign  off. Please call with questions. Pt will follow up with stroke clinic NP at [facility] in about 4 weeks. Thanks for the consult.   Rosalin Hawking, MD PhD Stroke Neurology 09/13/2021 4:33 PM     To contact Stroke Continuity provider, please refer to [email or web address] After hours, contact General Neurology

## 2021-09-14 LAB — BASIC METABOLIC PANEL
Anion gap: 9 (ref 5–15)
BUN: 18 mg/dL (ref 6–20)
CO2: 23 mmol/L (ref 22–32)
Calcium: 9.7 mg/dL (ref 8.9–10.3)
Chloride: 115 mmol/L — ABNORMAL HIGH (ref 98–111)
Creatinine, Ser: 1.01 mg/dL (ref 0.61–1.24)
GFR, Estimated: >60 mL/min (ref >60)
Glucose, Bld: 123 mg/dL — ABNORMAL HIGH (ref 70–99)
Potassium: 3.8 mmol/L (ref 3.5–5.1)
Sodium: 147 mmol/L — ABNORMAL HIGH (ref 135–145)

## 2021-09-14 LAB — GLUCOSE, CAPILLARY
Glucose-Capillary: 124 mg/dL — ABNORMAL HIGH (ref 70–99)
Glucose-Capillary: 132 mg/dL — ABNORMAL HIGH (ref 70–99)
Glucose-Capillary: 139 mg/dL — ABNORMAL HIGH (ref 70–99)
Glucose-Capillary: 148 mg/dL — ABNORMAL HIGH (ref 70–99)
Glucose-Capillary: 151 mg/dL — ABNORMAL HIGH (ref 70–99)
Glucose-Capillary: 207 mg/dL — ABNORMAL HIGH (ref 70–99)
Glucose-Capillary: 225 mg/dL — ABNORMAL HIGH (ref 70–99)

## 2021-09-14 LAB — MAGNESIUM: Magnesium: 2.3 mg/dL (ref 1.7–2.4)

## 2021-09-14 LAB — PHOSPHORUS: Phosphorus: 4.3 mg/dL (ref 2.5–4.6)

## 2021-09-14 MED ORDER — POLYETHYLENE GLYCOL 3350 17 G PO PACK
17.0000 g | PACK | Freq: Every day | ORAL | Status: DC | PRN
  Administered 2021-09-28: 17 g
  Filled 2021-09-14: qty 1

## 2021-09-14 NOTE — Progress Notes (Signed)
Calorie Count Note ? ?48 hour calorie count ordered. ? ?Diet: Dysphagia 1 diet with Nectar thickened liquids ?TF: Vital 1.5 @ 60 with [employer] TF daily ? ? ?Spoke with RN who reports that TF had been turned off but unsure when this happened. MD confirmed that he would like TF to resume.  ?Per RN pt only ate bites at Breakfast and Lunch today.  ? ?Plan to initiate 48 hour Calorie count to determine if any changes to TF should be made.  ? ? ?Lockie Pares., RD, LDN, CNSC ?See AMiON for contact information  ? ? ?

## 2021-09-14 NOTE — Progress Notes (Signed)
Speech Language Pathology Treatment: Dysphagia;Cognitive-Linquistic (Aphasia) ?Patient Details ?Name: Joshua Price ?MRN: [id number] ?DOB: [birth year] ?Today's Date: 09/14/2021 ?Time: 1209-1232 ?SLP Time Calculation (min) (ACUTE ONLY): 23 min ? ?Assessment / Plan / Recommendation ?Clinical Impression ? Pt was seen for treatment. He was alert and cooperative during the session without c/o pain. Deanna Artis, RN reported that the pt has been tolerating the current diet without overt s/sx of aspiration. Repetition and phrase completion tasks were completed with 0% accuracy despite prompts and cues.  Pt produced multiple utterances in response to questions, but the majority of them were unintelligible with the exception of "two weeks" and "can't take no more" which he expressed in frustration after automatic sequences were attempted. Pt appeared to demonstrate difficulty demonstrating/maintaining adequate intraoral pressure and anterior spillage was noted with thin liquids. Liquids were sometimes almost blown from the oral cavity due to impaired labial seal and size of boluses. Pt demonstrated impulsive tendencies with p.o. intake. Intake rate was rapid with liquids despite cues. Pt initially consumed six consecutive swallows of thin liquids via cup and exhibited coughing thereafter. With cues, pt consumed a single bolus, but it was likely still too large, and coughing was demonstrated once more. Dysphagia 2/3 bolus manipulation and mastication were inadequate. Whole pieces were left between pt's teeth and on the floor of pt's mouth. Lingual manipulation was limited and these pieces were removed from the oral cavity. Bolus manipulation was more functional with puree. Pt consumed 4oz of nectar thick liquids via straw using consecutive swallows, and no s/sx of aspiration were noted. Pt's current diet of dysphagia 1 and nectar thick liquids will be continued at this time.  ?  ?HPI HPI: Patient is a 61 y.o. male with PMH: HTN, tobacco  use, esophageal varices who presented to ED via EMS on 2/25 as a code stroke after being found down and unable to get up on concrete porch of his home. EMS noted right sided weakness and incoherent speech. In ED, stat CT revealed acute left basal ganglia hemorrhage. on 2/25, MRI head revealed appearance of increasing size of left basal ganglia intraparenchymal hematoma and slightly increasing mass effect on left lateral ventricle and 26mm of left to right midline shift. ?  ?   ?SLP Plan ?   ? ?  ?  ?Recommendations for follow up therapy are one component of a multi-disciplinary discharge planning process, led by the attending physician.  Recommendations may be updated based on patient status, additional functional criteria and insurance authorization. ?  ? ?Recommendations  ?Diet recommendations: Dysphagia 1 (puree);Nectar-thick liquid ?Liquids provided via: Cup;Straw ?Medication Administration: Crushed with puree ?Supervision: Trained caregiver to feed patient;Staff to assist with self feeding ?Compensations: Minimize environmental distractions;Slow rate;Small sips/bites;Follow solids with liquid ?Postural Changes and/or Swallow Maneuvers: Seated upright 90 degrees  ?   ?    ?   ? ? ? ? Oral Care Recommendations: Oral care BID ?Follow Up Recommendations: Skilled nursing-short term rehab (<3 hours/day) ?Assistance recommended at discharge: Frequent or constant Supervision/Assistance ?SLP Visit Diagnosis: Dysphagia, oropharyngeal phase (R13.12) ? ? ? ? ?  ? Marshia Tropea I. Vear Clock, MS, CCC-SLP ?Acute Rehabilitation Services ?Office number [phone] ?Pager [phone] ? ? ? ?[name] ? ?09/14/2021, 1:20 PM ? ? ? ?

## 2021-09-14 NOTE — Progress Notes (Signed)
Occupational Therapy Treatment ?Patient Details ?Name: Joshua Price ?MRN: [id number] ?DOB: 06/07/1961 ?Today's Date: 09/14/2021 ? ? ?History of present illness 61 yo male presents to [facility] on [facility]/25 with fall at home, + ETOH, BP 218/134. CT head reveals acute parenchymal hemorrhage involving left lentiform nucleus and  surrounding white matter; repeat CT shows interval increase in side of intraparenchymal hematoma centered in L BG, increased edema and 11mm L-to-R midline shift. PMH includes HTN, smoker, esophageal varices. ?  ?OT comments ? This 61 yo male admitted with above seen today and focused on bed mobility to get cleaned up from bowel movement(can A readily when rolling to right, but to left), washing face (needs A for thoroughness--verbal and tactile cues), and looking at RUE to see if any change from only PROM and tightness in fingers (PROM remains WNL, no AROM noted, fingers are not tight today). He will continue to benefit from acute OT with follow now changed to SNF (due to lack of 24/7 care post AIR stay). We will continue to follow.  ? ?Recommendations for follow up therapy are one component of a multi-disciplinary discharge planning process, led by the attending physician.  Recommendations may be updated based on patient status, additional functional criteria and insurance authorization. ?   ?Follow Up Recommendations ? Skilled nursing-short term rehab (<3 hours/day)  ?  ?Assistance Recommended at Discharge Frequent or constant Supervision/Assistance  ?Patient can return home with the following ? Two people to help with walking and/or transfers;Two people to help with bathing/dressing/bathroom;Direct supervision/assist for medications management;Direct supervision/assist for financial management;Assistance with cooking/housework;Assist for transportation;Help with stairs or ramp for entrance;Assistance with feeding ?  ?Equipment Recommendations ? Other (comment) (TBD Next venue)  ?  ?   ?Precautions /  Restrictions Precautions ?Precautions: Fall ?Restrictions ?Weight Bearing Restrictions: No  ? ? ?  ? ?Mobility Bed Mobility ?Overal bed mobility: Needs Assistance ?Bed Mobility: Rolling ?Rolling: Min guard, Total assist ?  ?  ?  ?  ?General bed mobility comments: pt can roll right with min guard A and VCs to roll, rolls left with total A with minimal use of LUE to help with this and does not want to stay on his left side for long ?  ? ? ?  ?   ? ?ADL either performed or assessed with clinical judgement  ? ?ADL Overall ADL's : Needs assistance/impaired ?  ?  ?Grooming: Wash/dry face;Minimal assistance ?Grooming Details (indicate cue type and reason): for thoroughness while in chair position of bed ?  ?  ?  ?  ?  ?  ?  ?  ?  ?  ?  ?  ?  ?  ?  ?  ?  ? ?Extremity/Trunk Assessment Upper Extremity Assessment ?RUE Deficits / Details: flaccid--PROM WNL without tightness noted in digits today.Bruise noted at front top of shoulder ?RUE Coordination: decreased fine motor;decreased gross motor ?  ?  ?  ?  ?  ? ?Vision Baseline Vision/History: 1 Wears glasses (per Epic picture) ?Additional Comments: while supine in bed head turned slightly to left of midline and eyes turned left of midline as well ?  ?   ?   ? ?Cognition Arousal/Alertness: Awake/alert ?Behavior During Therapy: Flat affect ?Overall Cognitive Status: Difficult to assess ?Area of Impairment: Following commands, Problem solving ?  ?  ?  ?  ?  ?  ?  ?  ?  ?  ?  ?Following Commands: Follows one step commands inconsistently ?  ?  ?Problem Solving:  Decreased initiation, Difficulty sequencing, Requires verbal cues, Requires tactile cues ?General Comments: frustrated over having had a bowel movement and wasnt able to call for A (had call bell in his hand but did not know how to use it); had partially taken diaper off and had feces all over his gown, hand, bed linens, and call bell) ?  ?  ?   ?   ?   ?   ? ? ?Pertinent Vitals/ Pain       Pain Assessment ?Pain Assessment:  Faces ?Faces Pain Scale: No hurt ? ?   ?   ? ?Frequency ? Min 2X/week  ? ? ? ? ?  ?Progress Toward Goals ? ?OT Goals(current goals can now be found in the care plan section) ? Progress towards OT goals: Progressing toward goals ? ?Acute Rehab OT Goals ?Patient Stated Goal: pt unable to state due to expressive difficulties ?OT Goal Formulation: Patient unable to participate in goal setting ?Time For Goal Achievement: 09/21/21 ?Potential to Achieve Goals: Good  ?Plan Discharge plan needs to be updated   ? ?   ?AM-PAC OT "6 Clicks" Daily Activity     ?Outcome Measure ? ? Help from another person eating meals?: Total ?Help from another person taking care of personal grooming?: A Lot ?Help from another person toileting, which includes using toliet, bedpan, or urinal?: Total ?Help from another person bathing (including washing, rinsing, drying)?: Total ?Help from another person to put on and taking off regular upper body clothing?: Total ?Help from another person to put on and taking off regular lower body clothing?: Total ?6 Click Score: 7 ? ?  ?End of Session   ? ?OT Visit Diagnosis: Unsteadiness on feet (R26.81);Other abnormalities of gait and mobility (R26.89);Muscle weakness (generalized) (M62.81);Low vision, both eyes (H54.2);Other symptoms and signs involving cognitive function;Hemiplegia and hemiparesis;Cognitive communication deficit (R41.841) ?Symptoms and signs involving cognitive functions: Nontraumatic intracerebral hemorrhage ?Hemiplegia - Right/Left: Right ?Hemiplegia - dominant/non-dominant:  (unknown) ?Hemiplegia - caused by: Nontraumatic intracerebral hemorrhage ?  ?Activity Tolerance Patient tolerated treatment well ?  ?Patient Left in bed;with call bell/phone within reach;with bed alarm set (chair position) ?  ?Nurse Communication  (large bowel movement) ?  ? ?   ? ?Time: 4696-2952 ?OT Time Calculation (min): 31 min ? ?Charges: OT General Charges ?$OT Visit: 1 Visit ?OT Treatments ?$Self Care/Home  Management : 23-37 mins ? ?Ignacia Palma, OTR/L ?Acute Rehab Services ?Pager [phone] ?Office [phone] ? ? ? ?Evette Georges ?09/14/2021, 11:59 AM ?

## 2021-09-14 NOTE — Progress Notes (Addendum)
Physical Therapy Treatment ?Patient Details ?Name: Joshua Price ?MRN: [id number] ?DOB: 1961-02-09 ?Today's Date: 09/14/2021 ? ? ?History of Present Illness 61 yo male presents to [facility] on 2/25 with fall at home, + ETOH, BP 218/134. CT head reveals acute parenchymal hemorrhage involving left lentiform nucleus and  surrounding white matter; repeat CT shows interval increase in side of intraparenchymal hematoma centered in L BG, increased edema and 66mm L-to-R midline shift. PMH includes HTN, smoker, esophageal varices. ? ?  ?PT Comments  ? ? Pt was able to stand with max assist and UE support. Pt requires simple cues and does not always respond to cuing. Pt was able to speak more than previous session but was difficult to understand and was often mumbled. He occasionally became frustrated with deficits such as inability to stand independently and word finding. During treatment session patient showed deficits in strength, endurance, activity tolerance. Pt was denied for acute inpatient rehab due to decreased assistance after discharge. Changing recommendation for therapy services to skill nursing facility to address the previously stated deficits. Will continue to follow acutely to maximize functional mobility, independence, and safety. ?   ?Recommendations for follow up therapy are one component of a multi-disciplinary discharge planning process, led by the attending physician.  Recommendations may be updated based on patient status, additional functional criteria and insurance authorization. ? ?Follow Up Recommendations ? Skilled nursing-short term rehab (<3 hours/day) ?  ?  ?Assistance Recommended at Discharge Frequent or constant Supervision/Assistance  ?Patient can return home with the following Two people to help with walking and/or transfers;Two people to help with bathing/dressing/bathroom;Direct supervision/assist for medications management;Direct supervision/assist for financial management;Assist for  transportation;Help with stairs or ramp for entrance;Assistance with cooking/housework;Assistance with feeding ?  ?Equipment Recommendations ? Wheelchair (measurements PT);Wheelchair cushion (measurements PT);BSC/3in1;Other (comment)  ?  ?Recommendations for Other Services   ? ? ?  ?Precautions / Restrictions Precautions ?Precautions: Fall ?Precaution Comments: Pt pushes to the R ?Restrictions ?Weight Bearing Restrictions: No  ?  ? ?Mobility ? Bed Mobility ?Overal bed mobility: Needs Assistance ?Bed Mobility: Rolling, Sidelying to Sit ?Rolling: Min assist ?Sidelying to sit: Max assist ?  ?  ?  ?General bed mobility comments: Pt was able to help with rolling but needed min assist for facilitation and cuing. Pt was max assist with sitting up to EOB. ?  ? ?Transfers ?Overall transfer level: Needs assistance ?Equipment used:  (Railing) ?Transfers: Sit to/from Stand ?Sit to Stand: Max assist ?  ?  ?  ?  ?  ?General transfer comment: Pt required max assist from the therapist and the railing in the hallway to stand up from the recliner. Physyical assists was required to block his R knee and shift his hips forward over his feet. Pt required cuing for upright posture. ?  ? ?Ambulation/Gait ?  ?  ?  ?  ?  ?  ?  ?  ? ? ?Stairs ?  ?  ?  ?  ?  ? ? ?Wheelchair Mobility ?  ? ?Modified Rankin (Stroke Patients Only) ?  ? ? ?  ?Balance Overall balance assessment: Needs assistance ?Sitting-balance support: Single extremity supported, Feet supported ?Sitting balance-Leahy Scale: Poor ?Sitting balance - Comments: Pt required mod assit with periods of min assist for sitting EOB. ?Postural control: Posterior lean, Right lateral lean ?Standing balance support: Single extremity supported ?Standing balance-Leahy Scale: Zero ?Standing balance comment: Pt was wheeled out into the hallway in the recliner and was able to use a railing in the hallway  for L UE support. Max assist was provided for static standing balance. ?  ?  ?  ?  ?  ?  ?  ?  ?   ?  ?  ?  ? ?  ?Cognition Arousal/Alertness: Awake/alert ?Behavior During Therapy: Anxious ?Overall Cognitive Status: Difficult to assess ?Area of Impairment: Following commands, Problem solving ?  ?  ?  ?  ?  ?  ?  ?  ?  ?Current Attention Level: Focused ?  ?Following Commands: Follows one step commands inconsistently ?Safety/Judgement: Decreased awareness of safety, Decreased awareness of deficits ?  ?Problem Solving: Decreased initiation, Difficulty sequencing, Requires verbal cues, Requires tactile cues ?General Comments: Pt has difficulty sequencing movements and following commands. At times during the session he needed encouragement to participate due to anxiety about deficits but put forth effort when asked. ?  ?  ? ?  ?Exercises   ? ?  ?General Comments   ?  ?  ? ?Pertinent Vitals/Pain Pain Assessment ?Pain Assessment: No/denies pain  ? ? ?Home Living   ?  ?  ?  ?  ?  ?  ?  ?  ?  ?   ?  ?Prior Function    ?  ?  ?   ? ?PT Goals (current goals can now be found in the care plan section)   ? ?  ?Frequency ? ? ? Min 4X/week ? ? ? ?  ?PT Plan Discharge plan needs to be updated  ? ? ?Co-evaluation   ?  ?  ?  ?  ? ?  ?AM-PAC PT "6 Clicks" Mobility   ?Outcome Measure ? Help needed turning from your back to your side while in a flat bed without using bedrails?: A Lot ?Help needed moving from lying on your back to sitting on the side of a flat bed without using bedrails?: A Lot ?Help needed moving to and from a bed to a chair (including a wheelchair)?: Total ?Help needed standing up from a chair using your arms (e.g., wheelchair or bedside chair)?: A Lot ?Help needed to walk in hospital room?: Total ?Help needed climbing 3-5 steps with a railing? : Total ?6 Click Score: 9 ? ?  ?End of Session Equipment Utilized During Treatment: Gait belt ?Activity Tolerance: Other (comment) (Hard to tell but pt may have been limited by frustration.) ?Patient left: in chair;with call bell/phone within reach;with chair alarm set ?  ?PT  Visit Diagnosis: Other abnormalities of gait and mobility (R26.89);Muscle weakness (generalized) (M62.81);Other symptoms and signs involving the nervous system (R29.898);Hemiplegia and hemiparesis ?Hemiplegia - Right/Left: Right ?Hemiplegia - dominant/non-dominant: Dominant ?Hemiplegia - caused by: Nontraumatic intracerebral hemorrhage ?  ? ? ?Time: 4854-6270 ?PT Time Calculation (min) (ACUTE ONLY): 44 min ? ?Charges:  $Therapeutic Activity: 23-37 mins ?$Neuromuscular Re-education: 8-22 mins          ?          ? ?Armanda Heritage, SPT ? ? ? ?Armanda Heritage ?09/14/2021, 4:10 PM ? ?

## 2021-09-14 NOTE — TOC Progression Note (Signed)
Transition of Care (TOC) - Progression Note  ? ? ?Patient Details  ?Name: Joshua Price ?MRN: [id number] ?Date of Birth: [birth year] ? ?Transition of Care (TOC) CM/SW Contact  ?Eduard Roux, LCSW ?Phone Number: ?09/14/2021, 12:43 PM ? ?Clinical Narrative:    ? ?due to lack of 24/7 care post AIR stay patient has been recommended for SNF.  Patient lack payor source but has been referred to Financial Counseling for Medicaid.  Patient still has cotrak. At this time patient is difficult to place.  ? ?TOC continue to follow and assist with discharge planning. ? ?Antony Blackbird, MSW, LCSW ?Clinical Social Worker ? ? ?  ?  ? ?Expected Discharge Plan and Services ?  ?  ?  ?  ?  ?                ?  ?  ?  ?  ?  ?  ?  ?  ?  ?  ? ? ?Social Determinants of Health (SDOH) Interventions ?  ? ?Readmission Risk Interventions ?No flowsheet data found. ? ?

## 2021-09-14 NOTE — Progress Notes (Signed)
[facility] PROGRESS NOTE   Joshua Price [id number] DOB: [birth year] DOA: 09/05/2021  9 DOS: the patient was seen and examined on 09/14/2021  PCP: Elbert Ewings, FNP  Brief History and Hospital Course:  61 y.o. male with history of HTN, smoking, esophageal varcies presenting after he was found down on the concrete porch of his home.  He has been on the phone speaking with a relative and she heard him fall and he was able to state that he was on the ground and could not get up family member stated that this happened at 1645 on 2/25 and the son was able to get there within 5 minutes, he then called EMS.  On arrival he had decreased alertness, right hemineglect, dysarthric speech.  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  He was admitted to the neuro ICU.  Started on hypertonic saline which was subsequently discontinued.  CT scans were repeated which showed stable findings.  Developed hypernatremia but seems to be improving.  Continues to have significant dysarthria as well as dysphagia.  Consultants: Neurology.  Procedures: Transthoracic echocardiogram.  Cortrack feeding tube placement    Subjective: Difficult to communicate due to his dysarthria.  Does not appear to be in any discomfort.    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  Cerebral edema was noted on imaging studies. Patient was admitted to the neuro ICU.  Started on hypertonic saline.  CT head was repeated which showed stable findings with stable midline shift. Echocardiogram shows normal systolic function with grade 1 diastolic dysfunction. Neurology has signed off. Neurological status is stable.  Continues to have right hemiparesis and dysarthria. PT and OT is following.  Apparently not a good candidate for inpatient rehabilitation due to lack of family support.  Will  eventually need to go to skilled nursing facility for rehab.  Aspiration pneumonia [facility]) Patient developed fever on 3/3.  UA was unremarkable and not suggestive of infection.  Chest x-ray showed left lower lung infiltrate.  Patient with significant oropharyngeal dysphagia.  Likely aspirated.  Started on Augmentin.   Seems to be stable.  Fever appears to be subsiding.  Respiratory status is stable.  Hypernatremia Likely due to combination of poor oral intake as well as the fact that he was on hypertonic saline.  Sodium level peaked at 166.   Patient was started on free water.  Normal saline infusion was discontinued.   Sodium level has improved.  Down to 147 today.  Free water dose decreased.  Altered mental status Likely due to combination of stroke, hyponatremia, alcohol withdrawal.  Seems to be gradually improving.  Essential hypertension Presented with hypertensive emergency.  Currently on amlodipine and metoprolol.  Blood pressure has improved.  Continue current treatment.  Oropharyngeal dysphagia Speech therapy is following.  Currently getting tube feedings. He is on a dysphagia 1 diet.  Will need to ensure that he is able to take adequate calories by mouth before discontinuing the feeding tube. Nursing staff reported that patient had very good oral intake yesterday.  We will wait for evaluation by speech therapy as well as by nutritionist before discontinuing tube feedings.  Alcohol withdrawal ([place]) He was placed on CIWA protocol.  Does not have any withdrawal symptoms currently.   Noted to be on thiamine multivitamins folic acid.  Malnutrition of moderate degree On tube feedings.  Oral intake seems to be improving.  Hypokalemia Potassium  has been repleted.  Magnesium 2.3. He is on daily potassium supplement.    DVT Prophylaxis: SCDs Code Status: Full code Family Communication: No family at bedside.  Discussing with mother every few days. Disposition Plan: Most likely will  need to go to skilled nursing facility when medically stable  Status is: Inpatient Remains inpatient appropriate because: Acute intracranial hemorrhage      Medications: Scheduled:  amLODipine  10 mg Per Tube Daily   amoxicillin-clavulanate  875 mg Per Tube Q12H   chlorhexidine  15 mL Mouth Rinse BID   Chlorhexidine Gluconate Cloth  6 each Topical Daily   cholecalciferol  1,000 Units Per Tube Daily   feeding supplement (PROSource TF)  45 mL Per Tube Daily   folic acid  1 mg Per Tube Daily   free water  100 mL Per Tube Q4H   heparin injection (subcutaneous)  5,000 Units Subcutaneous Q8H   insulin aspart  0-15 Units Subcutaneous Q4H   mouth rinse  15 mL Mouth Rinse q12n4p   metoprolol tartrate  25 mg Per Tube BID   multivitamin with minerals  1 tablet Per Tube Daily   pantoprazole sodium  40 mg Per Tube QHS   polyethylene glycol  17 g Per Tube Daily   potassium chloride  40 mEq Per Tube Daily   senna-docusate  1 tablet Per Tube BID   Continuous:  feeding supplement (VITAL 1.5 CAL) 1,000 mL (09/11/21 0400)   [id number] **OR** acetaminophen (TYLENOL) oral liquid 160 mg/5 mL **OR** acetaminophen, hydrALAZINE, labetalol, lip balm, loperamide  Antibiotics: Anti-infectives (From admission, onward)    Start     Dose/Rate Route Frequency Ordered Stop   09/12/21 1215  amoxicillin-clavulanate (AUGMENTIN) 400-57 MG/5ML suspension 875 mg       Note to Pharmacy: Please adjust dose as indicated. For aspiration pna. thanks   875 mg Per Tube Every 12 hours 09/12/21 1117         Objective:  Vital Signs  Vitals:   09/13/21 1946 09/13/21 2323 09/14/21 0300 09/14/21 0730  BP: 125/80 132/77 123/83 (!) 153/87  Pulse: 85 84 80 90  Resp: 15 18 13 19   Temp:  99.4 F (37.4 C) 98.9 F (37.2 C) 99.1 F (37.3 C)  TempSrc:  Oral Axillary Oral  SpO2: 96% 95% 95% 97%  Weight:      Height:        Intake/Output Summary (Last 24 hours) at 09/14/2021 0856 Last data filed at 09/14/2021  0419 Gross per 24 hour  Intake 672 ml  Output 400 ml  Net 272 ml    Filed Weights   09/09/21 0630 09/10/21 0500 09/11/21 2112  Weight: 69 kg 70.9 kg 72.2 kg    General appearance: Awake alert.  In no distress Resp: Clear to auscultation bilaterally.  Normal effort Cardio: S1-S2 is normal regular.  No S3-S4.  No rubs murmurs or bruit GI: Abdomen is soft.  Nontender nondistended.  Bowel sounds are present normal.  No masses organomegaly Extremities: No edema.   Neurologic: Right hemiparesis   Lab Results:  Data Reviewed: I have personally reviewed labs and imaging study reports  CBC: Recent Labs  Lab 09/08/21 0615 09/09/21 0730 09/11/21 0637 09/12/21 0558 09/13/21 0302  WBC 5.4 5.8 7.1 6.7 7.2  HGB 13.8 14.9 14.4 12.9* 12.4*  HCT 41.3 42.2 42.8 39.0 37.9*  MCV 98.6 100.2* 100.2* 98.2 100.0  PLT 118* 121* 128* 128* 136*     Basic Metabolic Panel: Recent Labs  Lab 09/09/21  1828 09/10/21 0511 09/10/21 1117 09/10/21 1632 09/11/21 0039 09/11/21 3419 09/11/21 1203 09/11/21 2336 09/12/21 0558 09/12/21 1527 09/13/21 0302 09/14/21 0309  NA 160* 162* 163* 166*   < > 154*   < > 151* 150* 150* 148* 147*  K  --   --  3.2*  --   --  3.2*  --   --  3.2*  --  3.6 3.8  CL  --   --  130*  --   --  122*  --   --  115*  --  115* 115*  CO2  --   --  23  --   --  23  --   --  24  --  24 23  GLUCOSE  --   --  169*  --   --  166*  --   --  156*  --  155* 123*  BUN  --   --  13  --   --  12  --   --  17  --  18 18  CREATININE  --   --  1.33*  --   --  1.19  --   --  1.11  --  1.21 1.01  CALCIUM  --   --  9.5  --   --  9.4  --   --  9.2  --  9.2 9.7  MG 1.9 2.2  --  2.1  --  2.0  --   --   --   --   --  2.3  PHOS 4.3 4.2  --  3.7  --  3.8  --   --   --   --   --  4.3   < > = values in this interval not displayed.     GFR: Estimated Creatinine Clearance: 70.2 mL/min (by C-G formula based on SCr of 1.01 mg/dL).   CBG: Recent Labs  Lab 09/13/21 1536 09/13/21 2102  09/13/21 2325 09/14/21 0326 09/14/21 0737  GLUCAP 237* 251* 216* 124* 148*      Recent Results (from the past 240 hour(s))  Resp Panel by RT-PCR (Flu A&B, Covid) Nasopharyngeal Swab     Status: None   Collection Time: 09/05/21  6:59 PM   Specimen: Nasopharyngeal Swab; Nasopharyngeal(NP) swabs in vial transport medium  Result Value Ref Range Status   SARS Coronavirus 2 by RT PCR NEGATIVE NEGATIVE Final    Comment: (NOTE) SARS-CoV-2 target nucleic acids are NOT DETECTED.  The SARS-CoV-2 RNA is generally detectable in upper respiratory specimens during the acute phase of infection. The lowest concentration of SARS-CoV-2 viral copies this assay can detect is 138 copies/mL. A negative result does not preclude SARS-Cov-2 infection and should not be used as the sole basis for treatment or other patient management decisions. A negative result may occur with  improper specimen collection/handling, submission of specimen other than nasopharyngeal swab, presence of viral mutation(s) within the areas targeted by this assay, and inadequate number of viral copies(<138 copies/mL). A negative result must be combined with clinical observations, patient history, and epidemiological information. The expected result is Negative.  Fact Sheet for Patients:  BloggerCourse.com  Fact Sheet for Healthcare Providers:  SeriousBroker.it  This test is no t yet approved or cleared by the [place] FDA and  has been authorized for detection and/or diagnosis of SARS-CoV-2 by FDA under an Emergency Use Authorization (EUA). This EUA will remain  in effect (meaning this test can be used) for the duration  of the COVID-19 declaration under Section 564(b)(1) of the Act, 21 U.S.C.section 360bbb-3(b)(1), unless the authorization is terminated  or revoked sooner.       Influenza A by PCR NEGATIVE NEGATIVE Final   Influenza B by PCR NEGATIVE NEGATIVE Final     Comment: (NOTE) The Xpert Xpress SARS-CoV-2/FLU/RSV plus assay is intended as an aid in the diagnosis of influenza from Nasopharyngeal swab specimens and should not be used as a sole basis for treatment. Nasal washings and aspirates are unacceptable for Xpert Xpress SARS-CoV-2/FLU/RSV testing.  Fact Sheet for Patients: EntrepreneurPulse.com.au  Fact Sheet for Healthcare Providers: IncredibleEmployment.be  This test is not yet approved or cleared by the [place] FDA and has been authorized for detection and/or diagnosis of SARS-CoV-2 by FDA under an Emergency Use Authorization (EUA). This EUA will remain in effect (meaning this test can be used) for the duration of the COVID-19 declaration under Section 564(b)(1) of the Act, 21 U.S.C. section 360bbb-3(b)(1), unless the authorization is terminated or revoked.  Performed at [facility], [place] [address], [place], [place] [address]   MRSA Next Gen by PCR, Nasal     Status: None   Collection Time: 09/05/21 10:06 PM   Specimen: Nasal Mucosa; Nasal Swab  Result Value Ref Range Status   MRSA by PCR Next Gen NOT DETECTED NOT DETECTED Final    Comment: (NOTE) The GeneXpert MRSA Assay (FDA approved for NASAL specimens only), is one component of a comprehensive MRSA colonization surveillance program. It is not intended to diagnose MRSA infection nor to guide or monitor treatment for MRSA infections. Test performance is not FDA approved in patients less than 93 years old. Performed at [facility], [place] [address], [place], [place] [address]   Culture, blood (routine x 2)     Status: None   Collection Time: 09/08/21  6:05 AM   Specimen: BLOOD RIGHT HAND  Result Value Ref Range Status   Specimen Description BLOOD RIGHT HAND  Final   Special Requests   Final    BOTTLES DRAWN AEROBIC AND ANAEROBIC Blood Culture results may not be optimal due to an excessive volume of blood received in  culture bottles   Culture   Final    NO GROWTH 5 DAYS Performed at [facility], [place] [address] [place], [place] [address]    Report Status 09/13/2021 FINAL  Final  Culture, blood (routine x 2)     Status: None   Collection Time: 09/08/21  6:14 AM   Specimen: BLOOD LEFT HAND  Result Value Ref Range Status   Specimen Description BLOOD LEFT HAND  Final   Special Requests   Final    BOTTLES DRAWN AEROBIC AND ANAEROBIC Blood Culture adequate volume   Culture   Final    NO GROWTH 5 DAYS Performed at [facility], [place] [address], [place],  [address]    Report Status 09/13/2021 FINAL  Final       Radiology Studies: CT ANGIO HEAD NECK W WO CM  Result Date: 09/13/2021 CLINICAL DATA:  Hemorrhagic stroke EXAM: CT ANGIOGRAPHY HEAD AND NECK TECHNIQUE: Multidetector CT imaging of the head and neck was performed using the standard protocol during bolus administration of intravenous contrast. Multiplanar CT image reconstructions and MIPs were obtained to evaluate the vascular anatomy. Carotid stenosis measurements (when applicable) are obtained utilizing NASCET criteria, using the distal internal carotid diameter as the denominator. RADIATION DOSE REDUCTION: This exam was performed according to the  departmental dose-optimization program which includes automated exposure control, adjustment of the mA and/or kV according to patient size and/or use of iterative reconstruction technique. CONTRAST:  83mL OMNIPAQUE IOHEXOL 350 MG/ML SOLN COMPARISON:  None. FINDINGS: CT HEAD FINDINGS Brain: Unchanged size of left basal ganglia intraparenchymal hematoma. No new mass effect. Mild persistent mass effect on the left lateral ventricle without hydrocephalus. The size and configuration of the ventricles and extra-axial CSF spaces are normal. There is no acute or chronic infarction. The brain parenchyma is normal. Skull: The visualized skull base, calvarium and extracranial soft tissues are normal.  Sinuses/Orbits: No fluid levels or advanced mucosal thickening of the visualized paranasal sinuses. No mastoid or middle ear effusion. The orbits are normal. CTA NECK FINDINGS SKELETON: There is no bony spinal canal stenosis. No lytic or blastic lesion. OTHER NECK: Normal pharynx, larynx and major salivary glands. No cervical lymphadenopathy. Unremarkable thyroid gland. UPPER CHEST: No pneumothorax or pleural effusion. No nodules or masses. AORTIC ARCH: There is calcific atherosclerosis of the aortic arch. There is no aneurysm, dissection or hemodynamically significant stenosis of the visualized portion of the aorta. Conventional 3 vessel aortic branching pattern. The visualized proximal subclavian arteries are widely patent. RIGHT CAROTID SYSTEM: No dissection, occlusion or aneurysm. Mild atherosclerotic calcification at the carotid bifurcation without hemodynamically significant stenosis. LEFT CAROTID SYSTEM: No dissection, occlusion or aneurysm. Mild atherosclerotic calcification at the carotid bifurcation without hemodynamically significant stenosis. VERTEBRAL ARTERIES: Codominant configuration. Both origins are clearly patent. There is no dissection, occlusion or flow-limiting stenosis to the skull base (V1-V3 segments). CTA HEAD FINDINGS POSTERIOR CIRCULATION: --Vertebral arteries: Normal V4 segments. --Inferior cerebellar arteries: Normal. --Basilar artery: Normal. --Superior cerebellar arteries: Normal. --Posterior cerebral arteries (PCA): Normal. ANTERIOR CIRCULATION: --Intracranial internal carotid arteries: Normal. --Anterior cerebral arteries (ACA): Normal. Both A1 segments are present. Patent anterior communicating artery (a-comm). --Middle cerebral arteries (MCA): Normal. VENOUS SINUSES: As permitted by contrast timing, patent. ANATOMIC VARIANTS: None Review of the MIP images confirms the above findings. IMPRESSION: 1. 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 2. No emergent large  vessel occlusion or hemodynamically significant stenosis of the head or neck. Aortic Atherosclerosis (ICD10-I70.0). Electronically Signed   By: Ulyses Jarred M.D.   On: 09/13/2021 00:04   DG CHEST PORT 1 VIEW  Result Date: 09/12/2021 CLINICAL DATA:  Intracranial hematoma.  Fevers. EXAM: PORTABLE CHEST 1 VIEW COMPARISON:  09/08/2021 FINDINGS: Feeding tube tip is well below the level of the hemidiaphragms. Heart size is stable. No pleural effusion or edema identified. Mild patchy opacity within the left lower lobe is new compared with previous exam. Right lung is clear. IMPRESSION: New patchy opacity within the left lower lobe which may represent atelectasis or pneumonia. Electronically Signed   By: Kerby Moors M.D.   On: 09/12/2021 10:38       LOS: 9 days   Luke Falero [employer] on [email or web address]  09/14/2021, 8:56 AM

## 2021-09-15 LAB — BASIC METABOLIC PANEL
Anion gap: 9 (ref 5–15)
BUN: 16 mg/dL (ref 6–20)
CO2: 22 mmol/L (ref 22–32)
Calcium: 9.6 mg/dL (ref 8.9–10.3)
Chloride: 111 mmol/L (ref 98–111)
Creatinine, Ser: 1.01 mg/dL (ref 0.61–1.24)
GFR, Estimated: >60 mL/min (ref >60)
Glucose, Bld: 231 mg/dL — ABNORMAL HIGH (ref 70–99)
Potassium: 3.9 mmol/L (ref 3.5–5.1)
Sodium: 142 mmol/L (ref 135–145)

## 2021-09-15 LAB — GLUCOSE, CAPILLARY
Glucose-Capillary: 216 mg/dL — ABNORMAL HIGH (ref 70–99)
Glucose-Capillary: 241 mg/dL — ABNORMAL HIGH (ref 70–99)
Glucose-Capillary: 245 mg/dL — ABNORMAL HIGH (ref 70–99)
Glucose-Capillary: 250 mg/dL — ABNORMAL HIGH (ref 70–99)
Glucose-Capillary: 289 mg/dL — ABNORMAL HIGH (ref 70–99)
Glucose-Capillary: 343 mg/dL — ABNORMAL HIGH (ref 70–99)

## 2021-09-15 MED ORDER — PROSOURCE TF PO LIQD
45.0000 mL | Freq: Two times a day (BID) | ORAL | Status: DC
  Administered 2021-09-15 – 2021-09-17 (×4): 45 mL
  Filled 2021-09-15 (×4): qty 45

## 2021-09-15 MED ORDER — VITAL 1.5 CAL PO LIQD
1000.0000 mL | ORAL | Status: DC
  Administered 2021-09-15: 1000 mL
  Filled 2021-09-15 (×2): qty 1000

## 2021-09-15 NOTE — Progress Notes (Signed)
[facility] PROGRESS NOTE   Joshua Price:[id number] DOB: [birth year] DOA: 09/05/2021  10 DOS: the patient was seen and examined on 09/15/2021  PCP: Dartha Lodge, FNP  Brief History and Hospital Course:  61 y.o. male with history of HTN, smoking, esophageal varcies presenting after he was found down on the concrete porch of his home.  He has been on the phone speaking with a relative and she heard him fall and he was able to state that he was on the ground and could not get up family member stated that this happened at 1645 on 2/25 and the son was able to get there within 5 minutes, he then called EMS.  On arrival he had decreased alertness, right hemineglect, dysarthric speech.  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  He was admitted to the neuro ICU.  Started on hypertonic saline which was subsequently discontinued.  CT scans were repeated which showed stable findings.  Developed hypernatremia which has improved with free water.  Continues to have significant dysarthria as well as dysphagia.  Consultants: Neurology.  Procedures: Transthoracic echocardiogram.  Cortrack feeding tube placement    Subjective: Difficult to communicate with patient due to significant dysarthria.    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  Cerebral edema was noted on imaging studies. Patient was admitted to the neuro ICU.  Started on hypertonic saline.  CT head was repeated which showed stable findings with stable midline shift. Echocardiogram shows normal systolic function with grade 1 diastolic dysfunction. Neurology has signed off. Neurological status is stable. PT and OT is following.  Apparently not a good candidate for inpatient rehabilitation due to lack of family support.  Will eventually need to go to skilled nursing facility for  rehab.  Aspiration pneumonia [facility]) Patient developed fever on 3/3.  UA was unremarkable and not suggestive of infection.  Chest x-ray showed left lower lung infiltrate.  Patient with significant oropharyngeal dysphagia.  Likely aspirated.  Started on Augmentin.   Seems to have stabilized.  Plan for 5-day course.  Hypernatremia Likely due to combination of poor oral intake as well as the fact that he was on hypertonic saline.  Sodium level peaked at 166.   Patient was started on free water.  Normal saline infusion was discontinued.   Sodium level noted to be normal today.  Altered mental status Likely due to combination of stroke, hyponatremia, alcohol withdrawal.  Seems to be gradually improving.  Essential hypertension Presented with hypertensive emergency.  Currently on amlodipine and metoprolol.  Blood pressure has improved.  Continue current treatment.  Oropharyngeal dysphagia Currently getting tube feedings. He is on a dysphagia 1 diet.  Will need to ensure that he is able to take adequate calories by mouth before discontinuing the feeding tube. Speech therapy has been following.  Calorie count in progress.  Alcohol withdrawal (HCC) He was placed on CIWA protocol.  Does not have any withdrawal symptoms currently.   Noted to be on thiamine multivitamins folic acid.  Malnutrition of moderate degree On tube feedings.  Oral intake seems to be improving.  Calorie count in progress.  Hypokalemia Potassium has been repleted.  Magnesium 2.3. He is on daily potassium supplement.    DVT Prophylaxis: SCDs Code Status: Full code Family Communication: No family at bedside.  Discussing with mother every few days. Disposition Plan: Most likely will need to go to skilled nursing  facility when medically stable  Status is: Inpatient Remains inpatient appropriate because: Acute intracranial hemorrhage      Medications: Scheduled:  amLODipine  10 mg Per Tube Daily    amoxicillin-clavulanate  875 mg Per Tube Q12H   chlorhexidine  15 mL Mouth Rinse BID   Chlorhexidine Gluconate Cloth  6 each Topical Daily   cholecalciferol  1,000 Units Per Tube Daily   feeding supplement (PROSource TF)  45 mL Per Tube Daily   folic acid  1 mg Per Tube Daily   free water  100 mL Per Tube Q4H   heparin injection (subcutaneous)  5,000 Units Subcutaneous Q8H   insulin aspart  0-15 Units Subcutaneous Q4H   mouth rinse  15 mL Mouth Rinse q12n4p   metoprolol tartrate  25 mg Per Tube BID   multivitamin with minerals  1 tablet Per Tube Daily   pantoprazole sodium  40 mg Per Tube QHS   potassium chloride  40 mEq Per Tube Daily   Continuous:  feeding supplement (VITAL 1.5 CAL) 1,000 mL (09/14/21 1504)   RUE:[id number]:acetaminophen **OR** acetaminophen (TYLENOL) oral liquid 160 mg/5 mL **OR** acetaminophen, hydrALAZINE, labetalol, lip balm, polyethylene glycol  Antibiotics: Anti-infectives (From admission, onward)    Start     Dose/Rate Route Frequency Ordered Stop   09/12/21 1215  amoxicillin-clavulanate (AUGMENTIN) 400-57 MG/5ML suspension 875 mg       Note to Pharmacy: Please adjust dose as indicated. For aspiration pna. thanks   875 mg Per Tube Every 12 hours 09/12/21 1117         Objective:  Vital Signs  Vitals:   09/14/21 2135 09/14/21 2306 09/15/21 0308 09/15/21 0500  BP: 134/82 (!) 145/82 (!) 151/91   Pulse: 90 79 85   Resp:  18 20   Temp:  98.4 F (36.9 C) 99 F (37.2 C)   TempSrc:  Oral Oral   SpO2:  96% 97%   Weight:    75 kg  Height:        Intake/Output Summary (Last 24 hours) at 09/15/2021 0944 Last data filed at 09/15/2021 0845 Gross per 24 hour  Intake 480 ml  Output 851 ml  Net -371 ml    Filed Weights   09/10/21 0500 09/11/21 2112 09/15/21 0500  Weight: 70.9 kg 72.2 kg 75 kg    General appearance: Awake alert.  In no distress.  Distracted. Resp: Clear to auscultation bilaterally.  Normal effort Cardio: S1-S2 is normal regular.  No S3-S4.  No  rubs murmurs or bruit GI: Abdomen is soft.  Nontender nondistended.  Bowel sounds are present normal.  No masses organomegaly Extremities: No edema.   Neurologic: Right hemiparesis   Lab Results:  Data Reviewed: I have personally reviewed labs and imaging study reports  CBC: Recent Labs  Lab 09/09/21 0730 09/11/21 0637 09/12/21 0558 09/13/21 0302  WBC 5.8 7.1 6.7 7.2  HGB 14.9 14.4 12.9* 12.4*  HCT 42.2 42.8 39.0 37.9*  MCV 100.2* 100.2* 98.2 100.0  PLT 121* 128* 128* 136*     Basic Metabolic Panel: Recent Labs  Lab 09/09/21 1828 09/10/21 0511 09/10/21 1117 09/10/21 1632 09/11/21 0039 09/11/21 [id number] 09/11/21 1203 09/12/21 0558 09/12/21 1527 09/13/21 0302 09/14/21 0309 09/15/21 0328  NA 160* 162*   < > 166*   < > 154*   < > 150* 150* 148* 147* 142  K  --   --    < >  --   --  3.2*  --  3.2*  --  3.6 3.8 3.9  CL  --   --    < >  --   --  122*  --  115*  --  115* 115* 111  CO2  --   --    < >  --   --  23  --  24  --  24 23 22   GLUCOSE  --   --    < >  --   --  166*  --  156*  --  155* 123* 231*  BUN  --   --    < >  --   --  12  --  17  --  18 18 16   CREATININE  --   --    < >  --   --  1.19  --  1.11  --  1.21 1.01 1.01  CALCIUM  --   --    < >  --   --  9.4  --  9.2  --  9.2 9.7 9.6  MG 1.9 2.2  --  2.1  --  2.0  --   --   --   --  2.3  --   PHOS 4.3 4.2  --  3.7  --  3.8  --   --   --   --  4.3  --    < > = values in this interval not displayed.     GFR: Estimated Creatinine Clearance: 70.2 mL/min (by C-G formula based on SCr of 1.01 mg/dL).   CBG: Recent Labs  Lab 09/14/21 1502 09/14/21 1935 09/14/21 2335 09/15/21 0355 09/15/21 0835  GLUCAP 132* 207* 225* 216* 241*      Recent Results (from the past 240 hour(s))  Resp Panel by RT-PCR (Flu A&B, Covid) Nasopharyngeal Swab     Status: None   Collection Time: 09/05/21  6:59 PM   Specimen: Nasopharyngeal Swab; Nasopharyngeal(NP) swabs in vial transport medium  Result Value Ref Range Status    SARS Coronavirus 2 by RT PCR NEGATIVE NEGATIVE Final    Comment: (NOTE) SARS-CoV-2 target nucleic acids are NOT DETECTED.  The SARS-CoV-2 RNA is generally detectable in upper respiratory specimens during the acute phase of infection. The lowest concentration of SARS-CoV-2 viral copies this assay can detect is 138 copies/mL. A negative result does not preclude SARS-Cov-2 infection and should not be used as the sole basis for treatment or other patient management decisions. A negative result may occur with  improper specimen collection/handling, submission of specimen other than nasopharyngeal swab, presence of viral mutation(s) within the areas targeted by this assay, and inadequate number of viral copies(<138 copies/mL). A negative result must be combined with clinical observations, patient history, and epidemiological information. The expected result is Negative.  Fact Sheet for Patients:  11/15/21  Fact Sheet for Healthcare Providers:  09/07/21  This test is no t yet approved or cleared by the [employer] FDA and  has been authorized for detection and/or diagnosis of SARS-CoV-2 by FDA under an Emergency Use Authorization (EUA). This EUA will remain  in effect (meaning this test can be used) for the duration of the COVID-19 declaration under Section 564(b)(1) of the Act, 21 U.S.C.section 360bbb-3(b)(1), unless the authorization is terminated  or revoked sooner.       Influenza A by PCR NEGATIVE NEGATIVE Final   Influenza B by PCR NEGATIVE NEGATIVE Final    Comment: (NOTE) The Xpert Xpress SARS-CoV-2/FLU/RSV plus assay is intended as an aid in the  diagnosis of influenza from Nasopharyngeal swab specimens and should not be used as a sole basis for treatment. Nasal washings and aspirates are unacceptable for Xpert Xpress SARS-CoV-2/FLU/RSV testing.  Fact Sheet for  Patients: [employer]com  Fact Sheet for Healthcare Providers: SeriousBroker.it  This test is not yet approved or cleared by the [place] FDA and has been authorized for detection and/or diagnosis of SARS-CoV-2 by FDA under an Emergency Use Authorization (EUA). This EUA will remain in effect (meaning this test can be used) for the duration of the COVID-19 declaration under Section 564(b)(1) of the Act, 21 U.S.C. section 360bbb-3(b)(1), unless the authorization is terminated or revoked.  Performed at [facility], [address] [place], [place] [address]   MRSA Next Gen by PCR, Nasal     Status: None   Collection Time: 09/05/21 10:06 PM   Specimen: Nasal Mucosa; Nasal Swab  Result Value Ref Range Status   MRSA by PCR Next Gen NOT DETECTED NOT DETECTED Final    Comment: (NOTE) The GeneXpert MRSA Assay (FDA approved for NASAL specimens only), is one component of a comprehensive MRSA colonization surveillance program. It is not intended to diagnose MRSA infection nor to guide or monitor treatment for MRSA infections. Test performance is not FDA approved in patients less than 71 years old. Performed at [facility], [address], [place], [place] [address]   Culture, blood (routine x 2)     Status: None   Collection Time: 09/08/21  6:05 AM   Specimen: BLOOD RIGHT HAND  Result Value Ref Range Status   Specimen Description BLOOD RIGHT HAND  Final   Special Requests   Final    BOTTLES DRAWN AEROBIC AND ANAEROBIC Blood Culture results may not be optimal due to an excessive volume of blood received in culture bottles   Culture   Final    NO GROWTH 5 DAYS Performed at [facility], [address], [place], [place] [address]    Report Status 09/13/2021 FINAL  Final  Culture, blood (routine x 2)     Status: None   Collection Time: 09/08/21  6:14 AM   Specimen: BLOOD LEFT HAND  Result Value Ref Range Status    Specimen Description BLOOD LEFT HAND  Final   Special Requests   Final    BOTTLES DRAWN AEROBIC AND ANAEROBIC Blood Culture adequate volume   Culture   Final    NO GROWTH 5 DAYS Performed at [facility], [address], [place], [place] [address]    Report Status 09/13/2021 FINAL  Final       Radiology Studies: No results found.     LOS: 10 days   Gwenneth Whiteman [employer] on [email or web address]  09/15/2021, 9:44 AM

## 2021-09-15 NOTE — Progress Notes (Addendum)
Physical Therapy Treatment ?Patient Details ?Name: Joshua Price ?MRN: [id number] ?DOB: 11/21/1960 ?Today's Date: 09/15/2021 ? ? ?History of Present Illness 61 yo male [facility] with fall at home, + ETOH, BP 218/134. CT head reveals acute parenchymal hemorrhage involving left lentiform nucleus and  surrounding white matter; repeat CT shows interval increase in side of intraparenchymal hematoma centered in L BG, increased edema and 34mm L-to-R midline shift. PMH includes HTN, smoker, esophageal varices. ? ?  ?PT Comments  ? ?   ?  ?  Pt was able to stand with max assist and UE support. Pt requires simple cues and responds to cues more frequently. Communication remains a problem with pt unable to articulate what he wants and how he feels. He became frustrated with deficits such as inability to stand independently and impaired communication. During treatment session patient showed deficits in strength, endurance, activity tolerance.  Recommending therapy services to skill nursing facility to address the previously stated deficits. Will continue to follow acutely to maximize functional mobility, independence, and safety.  ?   ?Recommendations for follow up therapy are one component of a multi-disciplinary discharge planning process, led by the attending physician.  Recommendations may be updated based on patient status, additional functional criteria and insurance authorization. ? ?Follow Up Recommendations ? Skilled nursing-short term rehab (<3 hours/day) ?  ?  ?Assistance Recommended at Discharge Frequent or constant Supervision/Assistance  ?Patient can return home with the following Two people to help with walking and/or transfers;Two people to help with bathing/dressing/bathroom;Direct supervision/assist for medications management;Direct supervision/assist for financial management;Assist for transportation;Help with stairs or ramp for entrance;Assistance with cooking/housework;Assistance with feeding ?   ?Equipment Recommendations ? Wheelchair (measurements PT);Wheelchair cushion (measurements PT);BSC/3in1;Other (comment)  ?  ?Recommendations for Other Services   ? ? ?  ?Precautions / Restrictions Precautions ?Precautions: Fall ?Restrictions ?Weight Bearing Restrictions: No  ?  ? ?Mobility ? Bed Mobility ?Overal bed mobility: Needs Assistance ?Bed Mobility: Rolling, Sidelying to Sit, Sit to Supine ?Rolling: Min assist ?Sidelying to sit: Max assist ?  ?Sit to supine: Mod assist, +2 for safety/equipment ?  ?General bed mobility comments: Pt needed a lot of cuing and physical assistance with bed mobility due to inability to sequence movment and neglect. He was able to ?  ? ?Transfers ?Overall transfer level: Needs assistance ?  ?Transfers: Sit to/from Stand, Bed to chair/wheelchair/BSC ?Sit to Stand: Max assist ?  ?  ?Squat pivot transfers: Max assist ?  ?  ?General transfer comment: Pt's R knee was blocked by therapist, hips were facilitated forward, and max assist was required to stand up from recliner. Pt also supported himself with his L UE on a railing in the hallway. Sit to stand was performed x4 ?  ? ?Ambulation/Gait ?  ?  ?  ?  ?  ?  ?  ?  ? ? ?Stairs ?  ?  ?  ?  ?  ? ? ?Wheelchair Mobility ?  ? ?Modified Rankin (Stroke Patients Only) ?Modified Rankin (Stroke Patients Only) ?Pre-Morbid Rankin Score: No significant disability ?Modified Rankin: Severe disability ? ? ?  ?Balance Overall balance assessment: Needs assistance ?Sitting-balance support: Single extremity supported, Feet supported ?Sitting balance-Leahy Scale: Fair ?Sitting balance - Comments: Pt was able to sit at EOB with close supervision for a few seconds at a time but typically required min to mod assist with sitting balance. ?Postural control: Posterior lean, Right lateral lean ?Standing balance support: Single extremity supported, Reliant on assistive device for  balance ?Standing balance-Leahy Scale: Zero ?Standing balance comment: Pt required  max assist for stanting balance and heavy use of L UE on the railing in the hallway. ?  ?  ?  ?  ?  ?  ?  ?  ?  ?  ?  ?  ? ?  ?Cognition Arousal/Alertness: Awake/alert ?Behavior During Therapy: Anxious, Restless ?Overall Cognitive Status: Difficult to assess ?Area of Impairment: Problem solving ?  ?  ?  ?  ?  ?  ?  ?  ?  ?Current Attention Level: Focused ?  ?Following Commands: Follows one step commands inconsistently ?Safety/Judgement: Decreased awareness of safety, Decreased awareness of deficits ?Awareness: Intellectual ?Problem Solving: Decreased initiation, Difficulty sequencing, Requires verbal cues, Requires tactile cues ?General Comments: Pt could not problem solve or sequece how to sit EOB bed and required multimodal cuing to sit up. His R neglect has improved to where he can orient to midline and glance to the R a [place] better. Pt was frustrated with impairments and needed encouragement for continued standing attempts. ?  ?  ? ?  ?Exercises Other Exercises ?Other Exercises: Pt was able to attempt bridging 2x, glutes fired and lifted a [place] but unable to perform full bridge. ? ?  ?General Comments General comments (skin integrity, edema, etc.): Pt had a bowel movement in his bed. Pt was assisted with hygine. Pt was able to participate with rolling with min assist and cuing. ?  ?  ? ?Pertinent Vitals/Pain Pain Assessment ?Faces Pain Scale: Hurts a [place] bit ?Pain Location: Pt thouched his thighs when asked if he was having pain  ? ? ?Home Living   ?  ?  ?  ?  ?  ?  ?  ?  ?  ?   ?  ?Prior Function    ?  ?  ?   ? ?PT Goals (current goals can now be found in the care plan section)   ? ?  ?Frequency ? ? ? Min 4X/week ? ? ? ?  ?PT Plan Current plan remains appropriate  ? ? ?Co-evaluation   ?  ?  ?  ?  ? ?  ?AM-PAC PT "6 Clicks" Mobility   ?Outcome Measure ? Help needed turning from your back to your side while in a flat bed without using bedrails?: A Lot ?Help needed moving from lying on your back to sitting  on the side of a flat bed without using bedrails?: A Lot ?Help needed moving to and from a bed to a chair (including a wheelchair)?: A Lot ?Help needed standing up from a chair using your arms (e.g., wheelchair or bedside chair)?: A Lot ?Help needed to walk in hospital room?: Total ?Help needed climbing 3-5 steps with a railing? : Total ?6 Click Score: 10 ? ?  ?End of Session Equipment Utilized During Treatment: Gait belt ?Activity Tolerance: Patient tolerated treatment well;Other (comment) (Pt limited by frustration) ?Patient left: in bed;with call bell/phone within reach;with bed alarm set ?Nurse Communication: Mobility status ?PT Visit Diagnosis: Other abnormalities of gait and mobility (R26.89);Muscle weakness (generalized) (M62.81);Other symptoms and signs involving the nervous system (R29.898);Hemiplegia and hemiparesis ?Hemiplegia - Right/Left: Right ?Hemiplegia - dominant/non-dominant: Dominant ?Hemiplegia - caused by: Nontraumatic intracerebral hemorrhage ?  ? ? ?Time: [id number] ?PT Time Calculation (min) (ACUTE ONLY): 51 min ? ?Charges:  $Therapeutic Activity: 23-37 mins ?$Neuromuscular Re-education: 8-22 mins          ?          ? ?  Quenton Fetter, SPT ? ? ? ?Quenton Fetter ?09/15/2021, 5:23 PM ? ?

## 2021-09-15 NOTE — Progress Notes (Signed)
Calorie Count Note: Day 1 Results ? ?48-hour calorie count ordered. Calorie count was started on 3/06 at breakfast meal. Please see day 1 results below. ? ?Diet: dysphagia 1, nectar-thick liquids ?Supplements: none ? ?Current TF: Vital 1.5 @ 60 ml/hr, ProSource TF 45 ml daily, free water flushes 100 ml q 4 hours ? ?Day 1: ?3/06 Breakfast: 264 kcal, 5 grams of protein ?3/06 Lunch: 350 kcal, 9 grams of protein ?3/06 Dinner: 373 kcal, 10 grams of protein ? ?Day 1 total 24-hour intake: ?987 kcal (47% of minimum estimated needs)  ?24 grams of protein (23% of minimum estimated needs) ? ?Nutrition Diagnosis: Moderate Malnutrition related to social / environmental circumstances (EtOH abuse) as evidenced by mild fat depletion, moderate fat depletion, mild muscle depletion, severe muscle depletion. ? ?Goal: Patient will meet greater than or equal to 90% of their needs. ? ?Intervention:  ?- Continue 48-hour calorie count for 1 more day ?- Continue MVI with minerals daily per tube ?- Add Hormel Shake TID with meals, each supplement provides 520 kcal and 22 grams of protein ?- Transition to nocturnal tube feeds via Cortrak tube: Vital 1.5 @ 80 ml/hr x 12 hours from 1800 to 0600 with ProSource TF 45 ml BID to provide 1520 kcal, 87 kcal, and 733 ml free water (total of 1333 ml free water with current flushes) ?- Encourage PO intake ? ? ?Mertie Clause, MS, RD, LDN ?Inpatient Clinical Dietitian ?Please see AMiON for contact information. ?  ?

## 2021-09-15 NOTE — Progress Notes (Signed)
Calorie Count Note: Day 2 Results ? ?48-hour calorie count ordered. Calorie count was started on 3/06 at breakfast meal. Please see day 2 results below. ? ?Diet: dysphagia 1, nectar-thick liquids ?Supplements: Hormel Shake TID with meals ? ?Nocturnal TF: Vital 1.5 @ 80 ml/hr x 12 hours from 1800 to 0600, ProSource TF 45 ml BID ? ?Day 2: ?3/07 Breakfast: 618 kcal, 16 grams of protein ?3/07 Lunch: 472 kcal, 9 grams of protein ?3/07 Dinner: 351 kcal, 13 grams of protein ? ?Day 2 total 24-hour intake: ?1441 kcal (69% of minimum estimated needs)  ?38 grams of protein (36% of minimum estimated needs) ? ?Nutrition Diagnosis: Moderate Malnutrition related to social / environmental circumstances (EtOH abuse) as evidenced by mild fat depletion, moderate fat depletion, mild muscle depletion, severe muscle depletion. ? ?Goal: Patient will meet greater than or equal to 90% of their needs. ? ?Intervention: ?- d/c 48-hour calorie count ?- Continue MVI with minerals daily per tube ?- Continue Hormel Shake TID with meals, each supplement provides 520 kcal and 22 grams of protein ?- Continue nocturnal tube feeds via Cortrak tube: reduce rate of Vital 1.5 to 60 ml/hr x 12 hours from 1800 to 0600 with ProSource TF 45 ml BID to provide 1160 kcal, 71 kcal, and 550 ml free water (total of 1150 ml free water with current flushes) ?- Encourage PO intake ? ? ?Mertie Clause, MS, RD, LDN ?Inpatient Clinical Dietitian ?Please see AMiON for contact information. ?  ?

## 2021-09-15 NOTE — Progress Notes (Signed)
Inpatient Diabetes Program Recommendations ? ?AACE/ADA: New Consensus Statement on Inpatient Glycemic Control (2015) ? ?Target Ranges:  Prepandial:   less than 140 mg/dL ?     Peak postprandial:   less than 180 mg/dL (1-2 hours) ?     Critically ill patients:  140 - 180 mg/dL  ? ?Lab Results  ?Component Value Date  ? GLUCAP 241 (H) 09/15/2021  ? HGBA1C 5.3 09/07/2021  ? ? ?Review of Glycemic Control ? Latest Reference Range & Units 09/14/21 19:35 09/14/21 23:35 09/15/21 03:55 09/15/21 08:35  ?Glucose-Capillary 70 - 99 mg/dL 106 (H) 269 (H) 485 (H) 241 (H)  ?(H): Data is abnormally high ?Diabetes history: Type 2 DM ?Outpatient Diabetes medications: none ?Current orders for Inpatient glycemic control: Novolog 0-15 units Q4H ?Vital @60  ml/hr ? ?Inpatient Diabetes Program Recommendations:   ? ?Consider adding Novolog 3 units Q4H for tube feed coverage (to be stopped or held in the event tube feeds are stopped).  ? ?Thanks, ? , MSN, RNC-OB ?Diabetes Coordinator ?[phone] (8a-5p) ? ? ? ? ?

## 2021-09-16 DIAGNOSIS — R739 Hyperglycemia, unspecified: Secondary | ICD-10-CM

## 2021-09-16 LAB — BASIC METABOLIC PANEL
Anion gap: 9 (ref 5–15)
BUN: 17 mg/dL (ref 6–20)
CO2: 22 mmol/L (ref 22–32)
Calcium: 9.5 mg/dL (ref 8.9–10.3)
Chloride: 104 mmol/L (ref 98–111)
Creatinine, Ser: 1 mg/dL (ref 0.61–1.24)
GFR, Estimated: >60 mL/min (ref >60)
Glucose, Bld: 302 mg/dL — ABNORMAL HIGH (ref 70–99)
Potassium: 4.1 mmol/L (ref 3.5–5.1)
Sodium: 135 mmol/L (ref 135–145)

## 2021-09-16 LAB — GLUCOSE, CAPILLARY
Glucose-Capillary: 304 mg/dL — ABNORMAL HIGH (ref 70–99)
Glucose-Capillary: 283 mg/dL — ABNORMAL HIGH (ref 70–99)
Glucose-Capillary: 291 mg/dL — ABNORMAL HIGH (ref 70–99)
Glucose-Capillary: 356 mg/dL — ABNORMAL HIGH (ref 70–99)
Glucose-Capillary: 315 mg/dL — ABNORMAL HIGH (ref 70–99)
Glucose-Capillary: 330 mg/dL — ABNORMAL HIGH (ref 70–99)

## 2021-09-16 LAB — VITAMIN A: Vitamin A (Retinoic Acid): 24 ug/dL (ref 22.0–69.5)

## 2021-09-16 LAB — CBC
HCT: 35.6 % — ABNORMAL LOW (ref 39.0–52.0)
Hemoglobin: 11.9 g/dL — ABNORMAL LOW (ref 13.0–17.0)
MCH: 33.4 pg (ref 26.0–34.0)
MCHC: 33.4 g/dL (ref 30.0–36.0)
MCV: 100 fL (ref 80.0–100.0)
Platelets: 197 10*3/uL (ref 150–400)
RBC: 3.56 MIL/uL — ABNORMAL LOW (ref 4.22–5.81)
RDW: 13.1 % (ref 11.5–15.5)
WBC: 6.5 10*3/uL (ref 4.0–10.5)
nRBC: 0 % (ref 0.0–0.2)

## 2021-09-16 MED ORDER — INSULIN ASPART 100 UNIT/ML IJ SOLN
3.0000 [IU] | Freq: Three times a day (TID) | INTRAMUSCULAR | Status: DC
  Administered 2021-09-16 – 2021-09-17 (×3): 3 [IU] via SUBCUTANEOUS

## 2021-09-16 MED ORDER — INSULIN ASPART 100 UNIT/ML IJ SOLN: 3.0000 [IU] | INTRAMUSCULAR | Status: DC

## 2021-09-16 MED ORDER — ORAL CARE MOUTH RINSE
15.0000 mL | Freq: Two times a day (BID) | OROMUCOSAL | Status: DC
  Administered 2021-09-16 – 2021-10-01 (×27): 15 mL via OROMUCOSAL

## 2021-09-16 MED ORDER — INSULIN GLARGINE-YFGN 100 UNIT/ML ~~LOC~~ SOLN
10.0000 [IU] | Freq: Every day | SUBCUTANEOUS | Status: DC
  Administered 2021-09-16 – 2021-09-17 (×2): 10 [IU] via SUBCUTANEOUS
  Filled 2021-09-16 (×2): qty 0.1

## 2021-09-16 MED ORDER — VITAL 1.5 CAL PO LIQD
1000.0000 mL | ORAL | Status: DC
  Administered 2021-09-16: 1000 mL
  Filled 2021-09-16: qty 1000

## 2021-09-16 MED ORDER — CHLORHEXIDINE GLUCONATE 0.12 % MT SOLN
15.0000 mL | Freq: Two times a day (BID) | OROMUCOSAL | Status: DC
  Administered 2021-09-16 – 2021-10-01 (×30): 15 mL via OROMUCOSAL
  Filled 2021-09-16 (×27): qty 15

## 2021-09-16 NOTE — Progress Notes (Signed)
? ?  Speech Language Pathology Treatment: Dysphagia;Cognitive-Linquistic (Aphasia)  ?Patient Details ?Name: Joshua Price ?MRN: [id number] ?DOB: 14-Sep-1960 ?Today's Date: 09/17/2021 ?Time: [id number] ?SLP Time Calculation (min) (ACUTE ONLY): 27 min ? ?Assessment / Plan / Recommendation ?Clinical Impression ? Pt was seen for treatment. He was alert and cooperative during the session. Pt's RN reported that the pt has not been eating much and has been receiving the same foods each meal including mashed potatoes and green beans.  SLP contacted the kitchen host and ordered the pt's next four meals to ensure that he has variety. Dysphagia 2 and dysphagia 3 trials were attempted. Bolus manipulation was absent with dysphagia 2 solids despite cues. With cues for bolus manipulation and mastication, dysphagia 2 boluses either became lodged in areas where teeth were missing, or fell out of his mouth. Dysphagia 3 solids remained unmasticated despite limited mandibular and lingual movement, and the the majority of these boluses ultimately fell out of the pt's mouth with additional cues for bolus manipulation. Pt refused thin liquids by pushing the cup away when presented. Pt produced unintelligible utterances during the session in response to questions and language elicitation tasks. Pt became more agitated with SLP's prompts and cues and began attempting to get out of bed. SLP educated pt on plan for use of communication board; reinforcement will be necessary. Diet advancement is not clinically indicated at this time. SLP will continue to follow pt.   ?  ?HPI HPI: Patient is a 61 y.o. male with PMH: HTN, tobacco use, esophageal varices who presented to ED via EMS on 2/25 as a code stroke after being found down and unable to get up on concrete porch of his home. EMS noted right sided weakness and incoherent speech. In ED, stat CT revealed acute left basal ganglia hemorrhage. on 2/25, MRI head revealed appearance of increasing size of  left basal ganglia intraparenchymal hematoma and slightly increasing mass effect on left lateral ventricle and 83mm of left to right midline shift. ?  ?   ?SLP Plan ? Continue with current plan of care ? ?  ?  ?Recommendations for follow up therapy are one component of a multi-disciplinary discharge planning process, led by the attending physician.  Recommendations may be updated based on patient status, additional functional criteria and insurance authorization. ?  ? ?Recommendations  ?Diet recommendations: Dysphagia 1 (puree);Nectar-thick liquid ?Liquids provided via: Cup;Straw ?Medication Administration: Crushed with puree ?Supervision: Trained caregiver to feed patient;Staff to assist with self feeding ?Compensations: Minimize environmental distractions;Slow rate;Small sips/bites;Follow solids with liquid ?Postural Changes and/or Swallow Maneuvers: Seated upright 90 degrees  ?   ?    ?   ? ? ? ? Oral Care Recommendations: Oral care BID ?Follow Up Recommendations: Skilled nursing-short term rehab (<3 hours/day) ?Assistance recommended at discharge: Frequent or constant Supervision/Assistance ?SLP Visit Diagnosis: Dysphagia, oropharyngeal phase (R13.12) ?Plan: Continue with current plan of care ? ? ? ? ?  ?  ?Joshua Price, Joshua Price, CCC-SLP ?Acute Rehabilitation Services ?Office number [phone] ?Pager [phone] ? ? ?Joshua Price ? ?09/17/2021, 3:50 PM ?

## 2021-09-16 NOTE — Progress Notes (Signed)
Nutrition Follow-up ? ?DOCUMENTATION CODES:  ? ?Non-severe (moderate) malnutrition in context of social or environmental circumstances ? ?INTERVENTION:  ? ?- Continue Vital Cuisine Shake TID, each supplement provides 520 kcal and 22 grams of protein ? ?- Add Mighty Shake TID, each supplement provides 330 kcal and 9 grams of protein ? ?- Encourage PO intake and provide feeding assistance as needed ? ?- d/c tube feeding orders and remove Cortrak tube per discussion with MD ? ?- Continue MVI with minerals daily ? ?NUTRITION DIAGNOSIS:  ? ?Moderate Malnutrition related to social / environmental circumstances (EtOH abuse) as evidenced by mild fat depletion, moderate fat depletion, mild muscle depletion, severe muscle depletion. ? ?Ongoing, being addressed via oral nutrition supplements ? ?GOAL:  ? ?Patient will meet greater than or equal to 90% of their needs ? ?Progressing ? ?MONITOR:  ? ?PO intake, TF tolerance, Labs ? ?REASON FOR ASSESSMENT:  ? ?Other (new Cortrak) ?  ? ?ASSESSMENT:  ? ?Pt with a history of HTN, tobacco abuse (.25 pack/d), EtOH abuse, and esophageal varices presented to ED as a code stroke after being found down at home. In ED, pt in hypertensive crisis and imaging showing an acute left basal ganglia hemorrhage. ? ?03/01 - Cortrak placed (tip gastric) ? ?Pt with Cortrak in place for nocturnal tube feeds. Calorie count was completed. Pt met 47-69% of minimum kcal needs and 23-36% of minimum protein needs over the 48-hour time period. Pt's PO intake has improved over time. ? ?Discussed pt with RN and with MD. Faythe Ghee to order vitamin C given deficiency. Pt consumed ~750-800 kcal for breakfast this morning (multiple juice containers, tea, Vital Cuisine Shake, grits, pineapple). MD okay with d/c nocturnal tube feeding orders and removing Cortrak tube. RN aware. RD will attempt to maximize kcal and protein intake with oral nutrition supplements on meal trays. ? ?Attempted to speak with pt at bedside. Pt  sleeping soundly at time of RD visit and unable to provide information. ? ?Medications reviewed and include: cholecalciferol 4076 units daily, folic acid, SSI q 4 hours, novolog 3 units TID with meals, semglee 10 units daily, MVI with minerals, protonix, klor-con 40 mEq daily ? ?Vitamin/Mineral Profile: ?Thiamine B1: 87.4 (WNL) ?Vitamin B12: 1384 (H) ?Folate B9: 22.5 (WNL) ?Vitamin A: 24.0 (WNL) ?Vitamin D: 6.9 (L) ?Vitamin C: <0.1 (L) ?Zinc: 60 (WNL) ? ?Labs reviewed. CBG's: 238-356 x 24 hours ? ?Diet Order:   ?Diet Order   ? ?       ?  DIET - DYS 1 Room service appropriate? Yes; Fluid consistency: Nectar Thick  Diet effective now       ?  ? ?  ?  ? ?  ? ? ?EDUCATION NEEDS:  ? ?Not appropriate for education at this time ? ?Skin:  Skin Assessment: Reviewed RN Assessment (ecchymosis to the right shoulder) ? ?Last BM:  09/17/21 ? ?Height:  ? ?Ht Readings from Last 1 Encounters:  ?09/05/21 5' 6"  (1.676 m)  ? ? ?Weight:  ? ?Wt Readings from Last 1 Encounters:  ?09/17/21 73.3 kg  ? ? ?Ideal Body Weight:  64.5 kg ? ?BMI:  Body mass index is 26.08 kg/m?. ? ?Estimated Nutritional Needs:  ? ?Kcal:  2100-2300 kcal/d ? ?Protein:  105-120 g/d ? ?Fluid:  2.1-2.3 L/d ? ? ? ?Gustavus Bryant, MS, RD, LDN ?Inpatient Clinical Dietitian ?Please see AMiON for contact information. ? ?

## 2021-09-16 NOTE — Progress Notes (Addendum)
?Progress Note ? ? ?Patient: Joshua Price [id number] DOB: 10/24/1960 DOA: 09/05/2021     11 ?DOS: the patient was seen and examined on 09/16/2021 ?  ?Brief hospital course: ? ?61 y.o. male with history of HTN, smoking, esophageal varcies presenting after he was found down on the concrete porch of his home.  He has been on the phone speaking with a relative and she heard him fall and he was able to state that he was on the ground and could not get up family member stated that this happened at 1645 on 2/25 and the son was able to get there within 5 minutes, he then called EMS.  On arrival he had decreased alertness, right hemineglect, dysarthric speech.  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  He was admitted to the neuro ICU.  Started on hypertonic saline which was subsequently discontinued.  CT scans were repeated which showed stable findings.  Developed hypernatremia which has improved with free water.  Continues to have significant dysarthria as well as dysphagia. Placement issue.   ?  ?Consultants: Neurology. ?  ?Procedures: Transthoracic echocardiogram.  Cortrack feeding tube placement (currently getting nocturnal tube feeds)  ?  ? 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  Cerebral edema was noted on imaging studies. ?Patient was admitted to the neuro ICU.  Started on hypertonic saline.  CT head was repeated which showed stable findings with stable midline shift. ?Echocardiogram shows normal systolic function with grade 1 diastolic dysfunction. ?Neurology has signed off. ?Neurological status is stable. ?PT and OT is following.  Apparently not a good candidate for inpatient rehabilitation due to lack of family support.  Will eventually need to go to skilled nursing facility for rehab. ? ?Aspiration pneumonia (HCC) ?Patient developed fever on 3/3.  UA was  unremarkable and not suggestive of infection.  Chest x-ray showed left lower lung infiltrate.  Patient with significant oropharyngeal dysphagia.  Likely aspirated.  Started on Augmentin.   ?Seems to have stabilized.  Plan for 5-day course. ? ?Hypernatremia ?Likely due to combination of poor oral intake as well as the fact that he was on hypertonic saline.  Sodium level peaked at 166.   ?Patient was started on free water.  Normal saline infusion was discontinued.   ?Sodium level noted to be normal today. ? ?Altered mental status ?Likely due to combination of stroke, hyponatremia, alcohol withdrawal.  Seems to be gradually improving. ? ?Essential hypertension ?Presented with hypertensive emergency.  Currently on amlodipine and metoprolol.  Blood pressure has improved.  Continue current treatment. ? ?Oropharyngeal dysphagia ?Currently getting tube feedings. ?He is on a dysphagia 1 diet.  Will need to ensure that he is able to take adequate calories by mouth before discontinuing the feeding tube. ?Speech therapy has been following.  Calorie count in progress. ? ?Alcohol withdrawal (HCC) ?He was placed on CIWA protocol.  Does not have any withdrawal symptoms currently.   ?Noted to be on thiamine multivitamins folic acid. ? ?Malnutrition of moderate degree ?On tube feedings.  Oral intake seems to be improving.  Calorie count in progress. ? ?Hypokalemia ?Potassium has been repleted.  Magnesium 2.3. ?He is on daily potassium supplement-- may need to d/c ? ?Hyperglycemia ?Add lantus and SSI coverage while on TUBE feeds-- hold if feeding stopped ? ? ? ? ?Subjective: stool covering his mittens ? ? ?Physical Exam: ?Vitals:  ? 09/16/21 0407 09/16/21 0500 09/16/21  3299 09/16/21 1142  ?BP: 134/79   137/81  ?Pulse: 84   89  ?Resp: 20  18 18   ?Temp: 98.6 ?F (37 ?C)  98.5 ?F (36.9 ?C) 98.2 ?F (36.8 ?C)  ?TempSrc: Oral  Oral Oral  ?SpO2: 94%   97%  ?Weight:  74.9 kg    ?Height:      ? ? ?General: Appearance:     ?Overweight male in no  acute distress  ?   ?Lungs:     respirations unlabored  ?Heart:    Normal heart rate.  ?  ?MS:   All extremities are intact.  ?  ?Neurologic:   Awake, alert, oriented x 3. No apparent focal neurological           defect.   ?  ?Data Reviewed: ?Glucose elevated ? ? ? ?Disposition: ?Status is: Inpatient ?Remains inpatient appropriate because: needs SNF Placement ? Planned Discharge Destination: Skilled nursing facility ? ? ? ?Time spent: 85 minutes ? ?Author: ? , DO ?09/16/2021 12:58 PM ? ?For on call review [email or web address].  ?

## 2021-09-16 NOTE — Progress Notes (Signed)
Inpatient Diabetes Program Recommendations ? ?AACE/ADA: New Consensus Statement on Inpatient Glycemic Control (2015) ? ?Target Ranges:  Prepandial:   less than 140 mg/dL ?     Peak postprandial:   less than 180 mg/dL (1-2 hours) ?     Critically ill patients:  140 - 180 mg/dL  ? ?Lab Results  ?Component Value Date  ? GLUCAP 291 (H) 09/16/2021  ? HGBA1C 5.3 09/07/2021  ? ? ?Review of Glycemic Control ? Latest Reference Range & Units 09/15/21 20:11 09/15/21 23:30 09/16/21 04:06 09/16/21 09:00 09/16/21 11:43  ?Glucose-Capillary 70 - 99 mg/dL 175 (H) 102 (H) 585 (H) 283 (H) 291 (H)  ?(H): Data is abnormally high ?Diabetes history: Type 2 DM ?Outpatient Diabetes medications: none ?Current orders for Inpatient glycemic control: Novolog 0-15 units Q4H ?Vital @60  ml/hr ?  ?Inpatient Diabetes Program Recommendations:   ?  ?Consider adding Semglee 10 units QD and Novolog 3 units Q4H for tube feed coverage (to be stopped or held in the event tube feeds are stopped).  ? ?Thanks, ? , MSN, RNC-OB ?Diabetes Coordinator ?[phone] (8a-5p) ? ? ? ? ? ?

## 2021-09-16 NOTE — Assessment & Plan Note (Addendum)
Add lantus and SSI coverage  -adjust as able

## 2021-09-16 NOTE — Progress Notes (Signed)
Physical Therapy Treatment ?Patient Details ?Name: Joshua Price ?MRN: [id number] ?DOB: 1960/11/19 ?Today's Date: 09/16/2021 ? ? ?History of Present Illness 61 yo male presents to [facility] on 2/25 with fall at home, + ETOH, BP 218/134. CT head reveals acute parenchymal hemorrhage involving left lentiform nucleus and  surrounding white matter; repeat CT shows interval increase in side of intraparenchymal hematoma centered in L BG, increased edema and 85mm L-to-R midline shift. PMH includes HTN, smoker, esophageal varices. ? ?  ?PT Comments  ? ? Pt was able to stand with max assist +2 and UE support. Pt requires simple cues and responded to cuing less frequently today compared to yesterday. Communication remains a problem with pt. He is visibly frustrated and unable to clearly articulate frustrations. During treatment session patient showed deficits in strength, endurance, activity tolerance.  Recommending therapy services to skill nursing facility to address the previously stated deficits. Will continue to follow acutely to maximize functional mobility, independence, and safety.   ?Recommendations for follow up therapy are one component of a multi-disciplinary discharge planning process, led by the attending physician.  Recommendations may be updated based on patient status, additional functional criteria and insurance authorization. ? ?Follow Up Recommendations ? Skilled nursing-short term rehab (<3 hours/day) ?  ?  ?Assistance Recommended at Discharge Frequent or constant Supervision/Assistance  ?Patient can return home with the following Two people to help with walking and/or transfers;Two people to help with bathing/dressing/bathroom;Direct supervision/assist for medications management;Direct supervision/assist for financial management;Assist for transportation;Help with stairs or ramp for entrance;Assistance with cooking/housework;Assistance with feeding ?  ?Equipment Recommendations ? Wheelchair (measurements  PT);Wheelchair cushion (measurements PT);BSC/3in1;Other (comment)  ?  ?Recommendations for Other Services   ? ? ?  ?Precautions / Restrictions Precautions ?Precautions: Fall ?Restrictions ?Weight Bearing Restrictions: No  ?  ? ?Mobility ? Bed Mobility ?Overal bed mobility: Needs Assistance ?Bed Mobility: Rolling, Sidelying to Sit, Sit to Supine ?Rolling: Mod assist ?Sidelying to sit: Max assist, +2 for safety/equipment ?Supine to sit: Mod assist ?  ?  ?General bed mobility comments: Pt needed a lot of cuing and physical assistance with bed mobility due to inability to sequence movment and neglect. Pt need his hand to phsically be placed on the railing to his right. ?  ? ?Transfers ?Overall transfer level: Needs assistance ?  ?Transfers: Sit to/from Stand, Bed to chair/wheelchair/BSC ?Sit to Stand: Max assist, +2 physical assistance ?  ?  ?Squat pivot transfers: Max assist ?  ?  ?General transfer comment: Pt's R knee was blocked by therapist, hips were facilitated forward, and max assist was required to stand up from recliner. therapy tech helped with standing and hip postition. Pt supported himself with his L UE on a railing in the hallway. Sit to stand was performed x3. ?  ? ?Ambulation/Gait ?  ?  ?  ?  ?  ?  ?  ?  ? ? ?Stairs ?  ?  ?  ?  ?  ? ? ?Wheelchair Mobility ?  ? ?Modified Rankin (Stroke Patients Only) ?  ? ? ?  ?Balance Overall balance assessment: Needs assistance ?Sitting-balance support: Single extremity supported, Feet supported ?Sitting balance-Leahy Scale: Fair ?Sitting balance - Comments: Pt was able to sit at EOB with supervision for a few seconds at a time but typically required min to mod assistance sitting EOB. Pt was restless with sitting balance. ?Postural control: Posterior lean, Right lateral lean ?Standing balance support: Single extremity supported, Reliant on assistive device for balance ?Standing balance-Leahy Scale: Zero ?Standing  balance comment: Pt required max assist for stanting  balance and heavy use of L UE on the railing in the hallway. ?  ?  ?  ?  ?  ?  ?  ?  ?  ?  ?  ?  ? ?  ?Cognition Arousal/Alertness: Awake/alert ?Behavior During Therapy: Anxious, Restless ?Overall Cognitive Status: Difficult to assess ?Area of Impairment: Problem solving, Awareness, Following commands ?  ?  ?  ?  ?  ?  ?  ?  ?  ?Current Attention Level: Sustained ?  ?Following Commands: Follows one step commands inconsistently ?Safety/Judgement: Decreased awareness of safety, Decreased awareness of deficits ?Awareness: Intellectual ?Problem Solving: Decreased initiation, Difficulty sequencing, Requires verbal cues, Requires tactile cues ?General Comments: Pt could not problem solve or sequece how to sit EOB bed and required multimodal cuing to sit up. Pt was more distracted and frustrated today. Difficult to redirect pt. ?  ?  ? ?  ?Exercises   ? ?  ?General Comments   ?  ?  ? ?Pertinent Vitals/Pain Pain Assessment ?Faces Pain Scale: Hurts a Lukic bit ?Pain Location: Pt thouched his thighs when asked if he was having pain  ? ? ?Home Living   ?  ?  ?  ?  ?  ?  ?  ?  ?  ?   ?  ?Prior Function    ?  ?  ?   ? ?PT Goals (current goals can now be found in the care plan section)   ? ?  ?Frequency ? ? ? Min 4X/week ? ? ? ?  ?PT Plan Current plan remains appropriate  ? ? ?Co-evaluation   ?  ?  ?  ?  ? ?  ?AM-PAC PT "6 Clicks" Mobility   ?Outcome Measure ? Help needed turning from your back to your side while in a flat bed without using bedrails?: A Lot ?Help needed moving from lying on your back to sitting on the side of a flat bed without using bedrails?: A Lot ?Help needed moving to and from a bed to a chair (including a wheelchair)?: A Lot ?Help needed standing up from a chair using your arms (e.g., wheelchair or bedside chair)?: A Lot ?Help needed to walk in hospital room?: Total ?Help needed climbing 3-5 steps with a railing? : Total ?6 Click Score: 10 ? ?  ?End of Session Equipment Utilized During Treatment: Gait  belt ?Activity Tolerance: Patient tolerated treatment well;Other (comment) ?Patient left: in bed;with call bell/phone within reach;with bed alarm set ?Nurse Communication: Mobility status ?PT Visit Diagnosis: Other abnormalities of gait and mobility (R26.89);Muscle weakness (generalized) (M62.81);Other symptoms and signs involving the nervous system (R29.898);Hemiplegia and hemiparesis ?Hemiplegia - Right/Left: Right ?Hemiplegia - dominant/non-dominant: Dominant ?Hemiplegia - caused by: Nontraumatic intracerebral hemorrhage ?  ? ? ?Time: 0737-1062 ?PT Time Calculation (min) (ACUTE ONLY): 34 min ? ?Charges:  $Therapeutic Activity: 8-22 mins ?$Neuromuscular Re-education: 8-22 mins          ?          ? ?Armanda Heritage, SPT ? ? ? ?Armanda Heritage ?09/16/2021, 3:32 PM ? ?

## 2021-09-17 DIAGNOSIS — Z7189 Other specified counseling: Secondary | ICD-10-CM

## 2021-09-17 DIAGNOSIS — Z515 Encounter for palliative care: Secondary | ICD-10-CM

## 2021-09-17 LAB — BASIC METABOLIC PANEL
Anion gap: 11 (ref 5–15)
BUN: 18 mg/dL (ref 6–20)
CO2: 23 mmol/L (ref 22–32)
Calcium: 10.1 mg/dL (ref 8.9–10.3)
Chloride: 106 mmol/L (ref 98–111)
Creatinine, Ser: 1 mg/dL (ref 0.61–1.24)
GFR, Estimated: >60 mL/min (ref >60)
Glucose, Bld: 329 mg/dL — ABNORMAL HIGH (ref 70–99)
Potassium: 3.8 mmol/L (ref 3.5–5.1)
Sodium: 140 mmol/L (ref 135–145)

## 2021-09-17 LAB — GLUCOSE, CAPILLARY
Glucose-Capillary: 309 mg/dL — ABNORMAL HIGH (ref 70–99)
Glucose-Capillary: 238 mg/dL — ABNORMAL HIGH (ref 70–99)
Glucose-Capillary: 372 mg/dL — ABNORMAL HIGH (ref 70–99)
Glucose-Capillary: 374 mg/dL — ABNORMAL HIGH (ref 70–99)
Glucose-Capillary: 386 mg/dL — ABNORMAL HIGH (ref 70–99)
Glucose-Capillary: 301 mg/dL — ABNORMAL HIGH (ref 70–99)

## 2021-09-17 LAB — VITAMIN C: Vitamin C: <0.1 mg/dL — ABNORMAL LOW (ref 0.4–2.0)

## 2021-09-17 MED ORDER — INSULIN GLARGINE-YFGN 100 UNIT/ML ~~LOC~~ SOLN
14.0000 [IU] | Freq: Every day | SUBCUTANEOUS | Status: DC
  Administered 2021-09-18: 14 [IU] via SUBCUTANEOUS
  Filled 2021-09-17: qty 0.14

## 2021-09-17 MED ORDER — ASCORBIC ACID 500 MG PO TABS
500.0000 mg | ORAL_TABLET | Freq: Two times a day (BID) | ORAL | Status: DC
Start: 2021-09-17 — End: 2021-09-19
  Administered 2021-09-17 – 2021-09-19 (×5): 500 mg
  Filled 2021-09-17 (×5): qty 1

## 2021-09-17 NOTE — Progress Notes (Addendum)
Inpatient Diabetes Program Recommendations ? ?AACE/ADA: New Consensus Statement on Inpatient Glycemic Control (2015) ? ?Target Ranges:  Prepandial:   less than 140 mg/dL ?     Peak postprandial:   less than 180 mg/dL (1-2 hours) ?     Critically ill patients:  140 - 180 mg/dL  ? ?Lab Results  ?Component Value Date  ? GLUCAP 238 (H) 09/17/2021  ? HGBA1C 5.3 09/07/2021  ? ? ?Review of Glycemic Control ? Latest Reference Range & Units 09/16/21 20:13 09/16/21 23:05 09/17/21 03:18 09/17/21 07:58  ?Glucose-Capillary 70 - 99 mg/dL 086 (H) 578 (H) 469 (H) 238 (H)  ?(H): Data is abnormally high ?Diabetes history: Type 2 DM ?Outpatient Diabetes medications: none ?Current orders for Inpatient glycemic control: Novolog 0-15 units Q4H, Novolog 3 units TID, Semglee 10 units QD ?Vital @60  ml/hr nocturnal 1800-0600 ?  ?Inpatient Diabetes Program Recommendations:   ?  ?Patient is not yet consuming most meals at >50%.  ?If plan is to continue nocturnal tube feeds from 1800-0600 would recommend Novolog 5 units at 1800, 2200, 0200, 0400 and would discontinue Novolog 3 units TID until diet progression improves.  ?Additionally, consider increasing Semglee to 14 units QD ? ?Thanks, ? , MSN, RNC-OB ?Diabetes Coordinator ?[phone] (8a-5p) ? ? ? ? ? ?

## 2021-09-17 NOTE — Progress Notes (Signed)
PROGRESS NOTE    Joshua Price  LFY:[id number] DOB: 06/28/1961 DOA: 09/05/2021 PCP: Dartha Lodge, FNP    Brief Narrative:   61 y.o. male with history of HTN, smoking, esophageal varcies presenting after he was found down on the concrete porch of his home.  He has been on the phone speaking with a relative and she heard him fall and he was able to state that he was on the ground and could not get up family member stated that this happened at 1645 on 2/25 and the son was able to get there within 5 minutes, he then called EMS.  On arrival he had decreased alertness, right hemineglect, dysarthric speech.  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  He was admitted to the neuro ICU.  Started on hypertonic saline which was subsequently discontinued.  CT scans were repeated which showed stable findings.  Developed hypernatremia which has improved with free water.  Continues to have significant dysarthria as well as dysphagia. Placement issue.           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  Cerebral edema was noted on imaging studies. Patient was admitted to the neuro ICU.  Started on hypertonic saline.  CT head was repeated which showed stable findings with stable midline shift. Echocardiogram shows normal systolic function with grade 1 diastolic dysfunction. Neurology has signed off. PT and OT is following.  Apparently not a good candidate for inpatient rehabilitation due to lack of family support.  Will eventually need to go to skilled nursing facility for rehab.  -? Prognosis- patient still with functional deficits- discussed with mother on phone for palliative care consult for GOC as unclear how much if any he will recover  Aspiration pneumonia [facility]) Patient developed fever on 3/3.  UA was unremarkable and not suggestive of infection.  Chest  x-ray showed left lower lung infiltrate.  Patient with significant oropharyngeal dysphagia.  Likely aspirated.  Started on Augmentin.   Seems to have stabilized.  s/p 5-day course.  Hypernatremia Likely due to combination of poor oral intake as well as the fact that he was on hypertonic saline.  Sodium level peaked at 166.   Patient was started on free water.  Sodium level noted to be normal today.  Altered mental status Likely due to combination of stroke, hyponatremia, alcohol withdrawal.   -not sure how much patient will improve-- attempts to communicate but is unable to get words out and is frustrated.  Essential hypertension Presented with hypertensive emergency.  Currently on amlodipine and metoprolol.  Blood pressure has improved.  Continue current treatment.  Oropharyngeal dysphagia -tube feeds d/c'd 3/9 -DYS diet- has to be fed  Alcohol withdrawal (HCC) He was placed on CIWA protocol.  Does not have any withdrawal symptoms currently.   Noted to be on thiamine multivitamins folic acid.  Malnutrition of moderate degree Nutrition Status: Nutrition Problem: Moderate Malnutrition Etiology: social / environmental circumstances (EtOH abuse) Signs/Symptoms: mild fat depletion, moderate fat depletion, mild muscle depletion, severe muscle depletion Interventions: Prostat, Tube feeding, MVI    Hypokalemia Potassium has been repleted.  Magnesium 2.3. He is on daily potassium supplement-- may need to d/c  Hyperglycemia Add lantus and SSI coverage  -once eating consistently can change to TID plus meal coverage          DVT prophylaxis: heparin injection 5,000 Units Start: 09/12/21 2200 SCD's Start: 09/05/21 1942  Code Status: Full Code Family Communication: called mother  Disposition Plan:  Level of care: Progressive Status is: Inpatient Remains inpatient appropriate because: needs SNF placement    Consultants:  neurology     Subjective: Trying to  communicate but easily frustrated   Objective: Vitals:   09/16/21 2307 09/17/21 0350 09/17/21 0801 09/17/21 1117  BP: 125/73  (!) 168/81 129/78  Pulse: 82   72  Resp: 18   14  Temp: 98.7 F (37.1 C)  98.3 F (36.8 C) 98 F (36.7 C)  TempSrc: Oral  Axillary Oral  SpO2: 97%   97%  Weight:  73.3 kg    Height:        Intake/Output Summary (Last 24 hours) at 09/17/2021 1301 Last data filed at 09/17/2021 0900 Gross per 24 hour  Intake 420 ml  Output --  Net 420 ml   Filed Weights   09/15/21 0500 09/16/21 0500 09/17/21 0350  Weight: 75 kg 74.9 kg 73.3 kg    Examination:   General: Appearance:     Overweight male in no acute distress     Lungs:     respirations unlabored  Heart:    Normal heart rate.    MS:   All extremities are intact.    Neurologic:   Awake, alert, speech non-sensical, follows commands intermittently        Data Reviewed: I have personally reviewed following labs and imaging studies  CBC: Recent Labs  Lab 09/11/21 0637 09/12/21 0558 09/13/21 0302 09/16/21 0649  WBC 7.1 6.7 7.2 6.5  HGB 14.4 12.9* 12.4* 11.9*  HCT 42.8 39.0 37.9* 35.6*  MCV 100.2* 98.2 100.0 100.0  PLT 128* 128* 136* 197   Basic Metabolic Panel: Recent Labs  Lab 09/10/21 1632 09/11/21 0039 09/11/21 [id number] 09/11/21 1203 09/13/21 0302 09/14/21 0309 09/15/21 0328 09/16/21 0649 09/17/21 0410  NA 166*   < > 154*   < > 148* 147* 142 135 140  K  --   --  3.2*   < > 3.6 3.8 3.9 4.1 3.8  CL  --   --  122*   < > 115* 115* 111 104 106  CO2  --   --  23   < > 24 23 22 22 23   GLUCOSE  --   --  166*   < > 155* 123* 231* 302* 329*  BUN  --   --  12   < > 18 18 16 17 18   CREATININE  --   --  1.19   < > 1.21 1.01 1.01 1.00 1.00  CALCIUM  --   --  9.4   < > 9.2 9.7 9.6 9.5 10.1  MG 2.1  --  2.0  --   --  2.3  --   --   --   PHOS 3.7  --  3.8  --   --  4.3  --   --   --    < > = values in this interval not displayed.   GFR: Estimated Creatinine Clearance: 70.9 mL/min (by C-G  formula based on SCr of 1 mg/dL). Liver Function Tests: No results for input(s): AST, ALT, ALKPHOS, BILITOT, PROT, ALBUMIN in the last 168 hours. No results for input(s): LIPASE, AMYLASE in the last 168 hours. No results for input(s): AMMONIA in the last 168 hours. Coagulation Profile: No results for input(s): INR, PROTIME in the last 168 hours. Cardiac Enzymes: No results for input(s): CKTOTAL, CKMB, CKMBINDEX, TROPONINI in the  last 168 hours. BNP (last 3 results) No results for input(s): PROBNP in the last 8760 hours. HbA1C: No results for input(s): HGBA1C in the last 72 hours. CBG: Recent Labs  Lab 09/16/21 2013 09/16/21 2305 09/17/21 0318 09/17/21 0758 09/17/21 1123  GLUCAP 315* 330* 309* 238* 372*   Lipid Profile: No results for input(s): CHOL, HDL, LDLCALC, TRIG, CHOLHDL, LDLDIRECT in the last 72 hours. Thyroid Function Tests: No results for input(s): TSH, T4TOTAL, FREET4, T3FREE, THYROIDAB in the last 72 hours. Anemia Panel: No results for input(s): VITAMINB12, FOLATE, FERRITIN, TIBC, IRON, RETICCTPCT in the last 72 hours. Sepsis Labs: No results for input(s): PROCALCITON, LATICACIDVEN in the last 168 hours.  Recent Results (from the past 240 hour(s))  Culture, blood (routine x 2)     Status: None   Collection Time: 09/08/21  6:05 AM   Specimen: BLOOD RIGHT HAND  Result Value Ref Range Status   Specimen Description BLOOD RIGHT HAND  Final   Special Requests   Final    BOTTLES DRAWN AEROBIC AND ANAEROBIC Blood Culture results may not be optimal due to an excessive volume of blood received in culture bottles   Culture   Final    NO GROWTH 5 DAYS Performed at [facility], [address] [place], [place] [address]    Report Status 09/13/2021 FINAL  Final  Culture, blood (routine x 2)     Status: None   Collection Time: 09/08/21  6:14 AM   Specimen: BLOOD LEFT HAND  Result Value Ref Range Status   Specimen Description BLOOD LEFT HAND  Final   Special  Requests   Final    BOTTLES DRAWN AEROBIC AND ANAEROBIC Blood Culture adequate volume   Culture   Final    NO GROWTH 5 DAYS Performed at [facility], [address], [place], [place] [address]    Report Status 09/13/2021 FINAL  Final         Radiology Studies: No results found.      Scheduled Meds:  amLODipine  10 mg Per Tube Daily   vitamin C  500 mg Per Tube BID   chlorhexidine  15 mL Mouth Rinse BID   Chlorhexidine Gluconate Cloth  6 each Topical Daily   cholecalciferol  1,000 Units Per Tube Daily   folic acid  1 mg Per Tube Daily   heparin injection (subcutaneous)  5,000 Units Subcutaneous Q8H   insulin aspart  0-15 Units Subcutaneous Q4H   [START ON 09/18/2021] insulin glargine-yfgn  14 Units Subcutaneous Daily   mouth rinse  15 mL Mouth Rinse q12n4p   mouth rinse  15 mL Mouth Rinse q12n4p   metoprolol tartrate  25 mg Per Tube BID   multivitamin with minerals  1 tablet Per Tube Daily   pantoprazole sodium  40 mg Per Tube QHS   potassium chloride  40 mEq Per Tube Daily   Continuous Infusions:   LOS: 12 days    Time spent: 65 minutes spent on chart review, discussion with nursing staff, consultants, updating family and interview/physical exam; more than 50% of that time was spent in counseling and/or coordination of care.    Joseph Art, DO [facility] Available via Epic secure chat 7am-7pm After these hours, please refer to coverage provider listed on [email or web address] 09/17/2021, 1:01 PM

## 2021-09-17 NOTE — Consult Note (Cosign Needed)
Consultation Note Date: 09/17/2021   Patient Name: Joshua Price  DOB: 1961-02-20  MRN: [id number]  Age / Sex: 61 y.o., male  PCP: Dartha Lodge, FNP Referring Physician: Joseph Art, DO  Reason for Consultation: Establishing goals of care and Psychosocial/spiritual support  HPI/Patient Profile:   61 y.o. male with history of HTN, smoking, esophageal varcies presenting after he was found down on the concrete porch of his home.  He has been on the phone speaking with a relative and she heard him fall and he was able to state that he was on the ground and could not get up family member stated that this happened at 1645 on 2/25 and the son was able to get there within 5 minutes, he then called EMS.  On arrival he had decreased alertness, right hemineglect, dysarthric speech.  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  He was admitted to the neuro ICU.  CT scans were repeated which showed stable findings.  Developed hypernatremia which has improved with free water.  Continues to have significant dysarthria as well as dysphagia.    Today is day 11 of this hospitalization patient remains weak, requiring assistance with all activities of daily living.  Intermittently follows simply commands.  Without medical decsion capacity  Family face treatment option decisions,, advanced directive decisions and anticipatory care needs.    Clinical Assessment and Goals of Care:  This NP Lorinda Creed reviewed medical records, received report from team, assessed the patient and then meet at the patient's bedside along with his mother and his aunt to discuss diagnosis, prognosis, GOC, EOL wishes disposition and options.   Concept of Palliative Care was introduced as specialized medical care for people and their families living with serious illness.  If focuses on providing relief from the  symptoms and stress of a serious illness.  The goal is to improve quality of life for both the patient and the family. Values and goals of care important to patient and family were attempted to be elicited.    Created space and opportunity for mother to explore thoughts and feelings regarding current medical situation.  Mother/ [name] her great concern and love for her son.  [name] is her only living child, out of 5 children.  This is very difficult for her.  Ultimately she hopes for improvement and return to baseline.  She puts her trust and faith in God and her strong faith/ spirituality.   A  discussion was had today regarding advanced directives.  Concepts specific to code status, artifical feeding and hydration, continued IV antibiotics and rehospitalization was had.    Education offered on the importance of nutrition and hydration in overall recovery and wellness.  Mother hopes that with time [name] will continue to improvement and et "normally"  The difference between a aggressive medical intervention path  and a palliative comfort care path for this patient at this time was had.  MOST form introduced and a Hard Choices booklet left  for review.   At this time family is open to all offered and available medical interventions to prolong life.   Questions and concerns addressed.  Patient  encouraged to call with questions or concerns.     PMT will continue to support holistically.      No documented HPOA or advanced care planning documents.  Mother is next of kin and main decision maker.  Patient has  one biological 11043 year old son     SUMMARY OF RECOMMENDATIONS    Code Status/Advance Care Planning: Full code   Palliative Prophylaxis:  Aspiration, Delirium Protocol, Frequent Pain Assessment, and Oral Care  Additional Recommendations (Limitations, Scope, Preferences): Full Scope Treatment  Psycho-social/Spiritual:  Desire for further Chaplaincy  support:yes Additional Recommendations: Grief/Bereavement Support  Prognosis:  Unable to determine  Discharge Planning:  Family anticipate discharge to SNF, I believe Medicaid is in progress.  To Be Determined      Primary Diagnoses: Present on Admission:  ICH (intracerebral hemorrhage) (HCC)  Alcohol withdrawal (HCC)  Oropharyngeal dysphagia   I have reviewed the medical record, interviewed the patient and family, and examined the patient. The following aspects are pertinent.  Past Medical History:  Diagnosis Date   Esophageal varices (HCC)    Social History   Socioeconomic History   Marital status: Single    Spouse name: Not on file   Number of children: Not on file   Years of education: Not on file   Highest education level: Not on file  Occupational History   Not on file  Tobacco Use   Smoking status: Every Day    Packs/day: 0.25    Types: Cigarettes   Smokeless tobacco: Never  Vaping Use   Vaping Use: Never used  Substance and Sexual Activity   Alcohol use: Yes    Alcohol/week: 0.0 standard drinks   Drug use: No   Sexual activity: Not Currently    Birth control/protection: None  Other Topics Concern   Not on file  Social History Narrative   Not on file   Social Determinants of Health   Financial Resource Strain: Not on file  Food Insecurity: Not on file  Transportation Needs: Not on file  Physical Activity: Not on file  Stress: Not on file  Social Connections: Not on file   Family History  Problem Relation Age of Onset   Cancer Sister    Hypertension Sister    Hypertension Mother    Scheduled Meds:  amLODipine  10 mg Per Tube Daily   vitamin C  500 mg Per Tube BID   chlorhexidine  15 mL Mouth Rinse BID   Chlorhexidine Gluconate Cloth  6 each Topical Daily   cholecalciferol  1,000 Units Per Tube Daily   feeding supplement (PROSource TF)  45 mL Per Tube BID   feeding supplement (VITAL 1.5 CAL)  1,000 mL Per Tube Q24H   folic acid  1 mg  Per Tube Daily   free water  100 mL Per Tube Q4H   heparin injection (subcutaneous)  5,000 Units Subcutaneous Q8H   insulin aspart  0-15 Units Subcutaneous Q4H   insulin aspart  3 Units Subcutaneous TID WC   insulin glargine-yfgn  10 Units Subcutaneous Daily   mouth rinse  15 mL Mouth Rinse q12n4p   mouth rinse  15 mL Mouth Rinse q12n4p   metoprolol tartrate  25 mg Per Tube BID   multivitamin with minerals  1 tablet Per Tube Daily   pantoprazole sodium  40 mg Per  Tube QHS   potassium chloride  40 mEq Per Tube Daily   Continuous Infusions: PRN Meds:.acetaminophen **OR** acetaminophen (TYLENOL) oral liquid 160 mg/5 mL **OR** acetaminophen, hydrALAZINE, labetalol, lip balm, polyethylene glycol Medications Prior to Admission:  Prior to Admission medications   Medication Sig Start Date End Date Taking? Authorizing Provider  amLODipine (NORVASC) 5 MG tablet Take 1 tablet (5 mg total) by mouth daily. Patient not taking: Reported on 09/05/2021 02/24/15   Ambrose Finland, NP   Allergies  Allergen Reactions   Lisinopril Swelling   Review of Systems  Unable to perform ROS: Mental status change   Physical Exam Constitutional:      Appearance: He is ill-appearing.  Cardiovascular:     Rate and Rhythm: Normal rate.  Pulmonary:     Effort: Pulmonary effort is normal.  Skin:    General: Skin is warm and dry.  Neurological:     Mental Status: He is lethargic.    Vital Signs: BP (!) 168/81 (BP Location: Left Arm)    Pulse 82    Temp 98.3 F (36.8 C) (Axillary)    Resp 18    Ht 5\' 6"  (1.676 m)    Wt 73.3 kg    SpO2 97%    BMI 26.08 kg/m  Pain Scale: 0-10   Pain Score: 0-No pain   SpO2: SpO2: 97 % O2 Device:SpO2: 97 % O2 Flow Rate: .O2 Flow Rate (L/min): 0 L/min  IO: Intake/output summary:  Intake/Output Summary (Last 24 hours) at 09/17/2021 1122 Last data filed at 09/17/2021 0900 Gross per 24 hour  Intake 540 ml  Output --  Net 540 ml    LBM: Last BM Date : 09/17/21 Baseline  Weight: Weight: 65.8 kg Most recent weight: Weight: 73.3 kg     Palliative Assessment/Data: 30%    Discussed with Dr 11/17/21 and [facility] team   PMT will continue to support holistically.    This nurse practitioner informed  thefamily and the attending that I will be out of the hospital until Monday morning.  If the patient is still hospitalized I will follow-up at that time.  Call palliative medicine team phone # [phone] with questions or concerns in the interim.    Signed by: [phone], NP   Please contact Palliative Medicine Team phone at [phone] for questions and concerns.  For individual provider: See [phone]

## 2021-09-17 NOTE — Progress Notes (Signed)
Physical Therapy Treatment ?Patient Details ?Name: Joshua Price ?MRN: [id number] ?DOB: 10/22/1960 ?Today's Date: 09/17/2021 ? ? ?History of Present Illness 61 yo male presents to [facility] on 2/25 with fall at home, + ETOH, BP 218/134. CT head reveals acute parenchymal hemorrhage involving left lentiform nucleus and  surrounding white matter; repeat CT shows interval increase in side of intraparenchymal hematoma centered in L BG, increased edema and 32mm L-to-R midline shift. PMH includes HTN, smoker, esophageal varices. ? ?  ?PT Comments  ? ? Pt was able to sit EOB for the majority of the session with min to mod assist. He is demonstrating improvements in R neglect, looking at things in his right visual field without cuing, but will still not attend to his right arm even with cuing. He was able to participate with ADLs in sitting but as he fatigued he became frustrated. Communication remains and issue. During treatment session patient showed deficits in strength, endurance, activity tolerance. Recommending therapy services at skilled nursing facility to address the previously stated deficits. Will continue to follow acutely to maximize functional mobility, independence and safety. ?  ?Recommendations for follow up therapy are one component of a multi-disciplinary discharge planning process, led by the attending physician.  Recommendations may be updated based on patient status, additional functional criteria and insurance authorization. ? ?Follow Up Recommendations ? Skilled nursing-short term rehab (<3 hours/day) ?  ?  ?Assistance Recommended at Discharge Frequent or constant Supervision/Assistance  ?Patient can return home with the following Two people to help with walking and/or transfers;Two people to help with bathing/dressing/bathroom;Direct supervision/assist for medications management;Direct supervision/assist for financial management;Assist for transportation;Help with stairs or ramp for entrance;Assistance with  cooking/housework;Assistance with feeding ?  ?Equipment Recommendations ? Wheelchair (measurements PT);Wheelchair cushion (measurements PT);BSC/3in1;Other (comment)  ?  ?Recommendations for Other Services   ? ? ?  ?Precautions / Restrictions Precautions ?Precautions: Fall ?Precaution Comments: Pt pushes to the R ?Restrictions ?Weight Bearing Restrictions: No  ?  ? ?Mobility ? Bed Mobility ?Overal bed mobility: Needs Assistance ?Bed Mobility: Rolling, Sidelying to Sit, Sit to Supine ?Rolling: Mod assist ?Sidelying to sit: Max assist, +2 for safety/equipment ?  ?Sit to supine: Max assist, +2 for physical assistance, +2 for safety/equipment ?  ?General bed mobility comments: going to the left side of the bed this session, multimodal cues throughout, assist for all aspects, during return supine, Pt assisting with LUE and LLE to push up in bed. Pt wants to help with sliding up in the bed. ?  ? ?Transfers ?Overall transfer level: Needs assistance ?  ?  ?  ?  ?  ?  ?  ?  ?General transfer comment: Not performed today. ?  ? ?Ambulation/Gait ?  ?  ?  ?  ?  ?  ?  ?  ? ? ?Stairs ?  ?  ?  ?  ?  ? ? ?Wheelchair Mobility ?  ? ?Modified Rankin (Stroke Patients Only) ?Modified Rankin (Stroke Patients Only) ?Pre-Morbid Rankin Score: No significant disability ?Modified Rankin: Severe disability ? ? ?  ?Balance Overall balance assessment: Needs assistance ?Sitting-balance support: Single extremity supported, Feet supported ?Sitting balance-Leahy Scale: Poor ?Sitting balance - Comments: worked on shifting weight and WB through R elbow multiple times (forcing Pt into R lateral lean) Pt did better when given a countdown, and min A to push up progressing to min guard ?Postural control: Posterior lean ?  ?  ?  ?  ?  ?  ?  ?  ?  ?  ?  ?  ?  ?  ?  ? ?  ?  Cognition Arousal/Alertness: Awake/alert ?Behavior During Therapy: Restless ?Overall Cognitive Status: Impaired/Different from baseline ?Area of Impairment: Following commands, Awareness,  Problem solving, Attention, Safety/judgement ?  ?  ?  ?  ?  ?  ?  ?  ?  ?Current Attention Level: Sustained ?  ?Following Commands: Follows one step commands inconsistently, Follows one step commands with increased time ?Safety/Judgement: Decreased awareness of safety, Decreased awareness of deficits ?Awareness: Intellectual ?Problem Solving: Decreased initiation, Difficulty sequencing, Requires verbal cues, Requires tactile cues ?General Comments: Pt demonstrating increased attention to the right side today with visual attention and incorporating it into therapy today. Continues to be hard to understand with very mumbled speech and determining expressive vs receptive deficits. Pt did respond well to music today and nodded his head that he enjoyed it. Pt also demonstrating behaviors that he is frustrated during therapy and gets slightly agitated/restless. ?  ?  ? ?  ?Exercises General Exercises - Lower Extremity ?Long Arc Quad: Seated, AROM, Left, 15 reps, PROM, Right, 10 reps ? ?  ?General Comments General comments (skin integrity, edema, etc.): Pt indicated that he enjoyed background musice during therapy. ?  ?  ? ?Pertinent Vitals/Pain Pain Assessment ?Faces Pain Scale: No hurt  ? ? ?Home Living   ?  ?  ?  ?  ?  ?  ?  ?  ?  ?   ?  ?Prior Function    ?  ?  ?   ? ?PT Goals (current goals can now be found in the care plan section)   ? ?  ?Frequency ? ? ? Min 4X/week ? ? ? ?  ?PT Plan Current plan remains appropriate  ? ? ?Co-evaluation PT/OT/SLP Co-Evaluation/Treatment: Yes ?Reason for Co-Treatment: Complexity of the patient's impairments (multi-system involvement);To address functional/ADL transfers;Necessary to address cognition/behavior during functional activity ?PT goals addressed during session: Mobility/safety with mobility;Balance;Strengthening/ROM ?OT goals addressed during session: ADL's and self-care;Strengthening/ROM ?  ? ?  ?AM-PAC PT "6 Clicks" Mobility   ?Outcome Measure ? Help needed turning from  your back to your side while in a flat bed without using bedrails?: A Lot ?Help needed moving from lying on your back to sitting on the side of a flat bed without using bedrails?: A Lot ?Help needed moving to and from a bed to a chair (including a wheelchair)?: A Lot ?Help needed standing up from a chair using your arms (e.g., wheelchair or bedside chair)?: A Lot ?Help needed to walk in hospital room?: Total ?Help needed climbing 3-5 steps with a railing? : Total ?6 Click Score: 10 ? ?  ?End of Session Equipment Utilized During Treatment: Gait belt ?Activity Tolerance: Patient tolerated treatment well;Treatment limited secondary to agitation ?Patient left: in bed;with call bell/phone within reach;with bed alarm set ?Nurse Communication: Mobility status ?PT Visit Diagnosis: Other abnormalities of gait and mobility (R26.89);Muscle weakness (generalized) (M62.81);Other symptoms and signs involving the nervous system (R29.898);Hemiplegia and hemiparesis ?Hemiplegia - Right/Left: Right ?Hemiplegia - dominant/non-dominant: Dominant ?Hemiplegia - caused by: Nontraumatic intracerebral hemorrhage ?  ? ? ?Time: 5400-8676 ?PT Time Calculation (min) (ACUTE ONLY): 34 min ? ?Charges:  $Therapeutic Activity: 8-22 mins          ?          ? ?Armanda Heritage, SPT ? ? ?Armanda Heritage ?09/17/2021, 12:14 PM ? ?

## 2021-09-17 NOTE — Progress Notes (Signed)
Occupational Therapy Treatment Patient Details Name: Joshua Price MRN: [id number] DOB: [birth year] Today's Date: 09/17/2021   History of present illness 61 yo male presents to [facility] on 2/25 with fall at home, + ETOH, BP 218/134. CT head reveals acute parenchymal hemorrhage involving left lentiform nucleus and  surrounding white matter; repeat CT shows interval increase in side of intraparenchymal hematoma centered in L BG, increased edema and 79m L-to-R midline shift. PMH includes HTN, smoker, esophageal varices.   OT comments  PT/OT joint session and focused on activity tolerance, seated balance, attention to R side, functional grooming/ADL and cognition. Pt really benefitted from skilled hands of 2 therapists to challenge and encourage him. Music played in background and Pt nodded that he  really enjoyed it. Pt demonstrating increased visual attention to the right today from previous sessions. Pt was able to maintain sitting for over 20 min (varied from mod to min A) and participated in oral care with suction/oral care kit with mod A, dynamic seated balance exercises WB through R elbow, PROM for RUE, and worked on crossing midline - requesting that Pt use LUE to grab RUE and put in lap - at this point Pt became frustrated so was max A to complete this task. Pt continues to be difficult to understand, but cooperative and pleasant with therapy. OT will continue to follow acutely and plan of care for SNF post-acute continues to be appropriate.    Recommendations for follow up therapy are one component of a multi-disciplinary discharge planning process, led by the attending physician.  Recommendations may be updated based on patient status, additional functional criteria and insurance authorization.    Follow Up Recommendations  Skilled nursing-short term rehab (<3 hours/day)    Assistance Recommended at Discharge Frequent or constant Supervision/Assistance  Patient can return home with the following   Two people to help with walking and/or transfers;Two people to help with bathing/dressing/bathroom;Direct supervision/assist for medications management;Direct supervision/assist for financial management;Assistance with cooking/housework;Assist for transportation;Help with stairs or ramp for entrance;Assistance with feeding   Equipment Recommendations  Other (comment) (defer to next venue of care)    Recommendations for Other Services PT consult;Speech consult    Precautions / Restrictions Precautions Precautions: Fall Precaution Comments: Pt pushes to the R Restrictions Weight Bearing Restrictions: No       Mobility Bed Mobility Overal bed mobility: Needs Assistance Bed Mobility: Rolling, Sidelying to Sit, Sit to Supine Rolling: Mod assist Sidelying to sit: Max assist, +2 for safety/equipment   Sit to supine: Max assist, +2 for physical assistance, +2 for safety/equipment   General bed mobility comments: going to the left side of the bed this session, multimodal cues throughout, assist for all aspects, during return supine, Pt assisting with LUE and LLE to push up in bed    Transfers                   General transfer comment: NT this session, focus on seated balance and functional ADL tasks during unsupported sitting for activity tolerance     Balance Overall balance assessment: Needs assistance Sitting-balance support: Single extremity supported, Feet supported Sitting balance-Leahy Scale: Poor Sitting balance - Comments: worked on shifting weight and WB through R elbow multiple times (forcing Pt into R lateral lean) Pt did better when given a countdown, and min A to push up progressing to min guard Postural control: Posterior lean  ADL either performed or assessed with clinical judgement   ADL Overall ADL's : Needs assistance/impaired     Grooming: Oral care;Moderate assistance;Cueing for sequencing;Sitting Grooming  Details (indicate cue type and reason): utilizing oral care via suction for safety - very motivating and satifying for Pt     Lower Body Bathing: Minimal assistance;Sitting/lateral leans Lower Body Bathing Details (indicate cue type and reason): washing from knees down on BOTH legs with LUE, better attention to the right today     Lower Body Dressing: Maximal assistance                      Extremity/Trunk Assessment Upper Extremity Assessment Upper Extremity Assessment: RUE deficits/detail RUE Deficits / Details: flaccid--PROM WNL without tightness noted in digits today. RUE Sensation:  (difficult to determine due to impaired comminication) RUE Coordination: decreased fine motor;decreased gross motor            Vision   Additional Comments: better visual attention to the right today   Perception     Praxis      Cognition Arousal/Alertness: Awake/alert Behavior During Therapy: Restless Overall Cognitive Status: Impaired/Different from baseline Area of Impairment: Following commands, Awareness, Problem solving, Attention, Safety/judgement                   Current Attention Level: Sustained   Following Commands: Follows one step commands inconsistently, Follows one step commands with increased time Safety/Judgement: Decreased awareness of safety, Decreased awareness of deficits Awareness: Intellectual Problem Solving: Decreased initiation, Difficulty sequencing, Requires verbal cues, Requires tactile cues General Comments: Pt demonstrating increased attention to the right side today with visual attention and incorporating it into therapy today. Continues to be hard to understand with very mumbled speech and determining expressive vs receptive deficits. Pt did respond well to music today and nodded his head that he enjoyed it. Pt also demonstrating behaviors that he is frustrated during therapy and gets slightly agitated/restless.        Exercises Exercises:  Other exercises Other Exercises Other Exercises: PROM R shoulder flexion/extension, abduction. Elbow flex/ext. digits full extension.    Shoulder Instructions       General Comments PT/OT put on music in the background and PT nodded that he enjoyed it    Pertinent Vitals/ Pain       Pain Assessment Pain Assessment: Faces Faces Pain Scale: No hurt Pain Intervention(s): Monitored during session, Repositioned  Home Living                                          Prior Functioning/Environment              Frequency  Min 2X/week        Progress Toward Goals  OT Goals(current goals can now be found in the care plan section)  Progress towards OT goals: Progressing toward goals  Acute Rehab OT Goals Patient Stated Goal: did not state OT Goal Formulation: Patient unable to participate in goal setting Time For Goal Achievement: 09/21/21 Potential to Achieve Goals: Good  Plan Discharge plan remains appropriate;Frequency remains appropriate    Co-evaluation    PT/OT/SLP Co-Evaluation/Treatment: Yes Reason for Co-Treatment: Complexity of the patient's impairments (multi-system involvement);Necessary to address cognition/behavior during functional activity;For patient/therapist safety;To address functional/ADL transfers PT goals addressed during session: Mobility/safety with mobility;Balance;Strengthening/ROM OT goals addressed during session: ADL's and self-care;Strengthening/ROM  AM-PAC OT "6 Clicks" Daily Activity     Outcome Measure   Help from another person eating meals?: Total Help from another person taking care of personal grooming?: A Lot Help from another person toileting, which includes using toliet, bedpan, or urinal?: Total Help from another person bathing (including washing, rinsing, drying)?: A Lot Help from another person to put on and taking off regular upper body clothing?: Total Help from another person to put on and taking  off regular lower body clothing?: Total 6 Click Score: 8    End of Session    OT Visit Diagnosis: Unsteadiness on feet (R26.81);Other abnormalities of gait and mobility (R26.89);Muscle weakness (generalized) (M62.81);Low vision, both eyes (H54.2);Other symptoms and signs involving cognitive function;Hemiplegia and hemiparesis;Cognitive communication deficit (R41.841) Symptoms and signs involving cognitive functions: Nontraumatic intracerebral hemorrhage Hemiplegia - Right/Left: Right Hemiplegia - caused by: Nontraumatic intracerebral hemorrhage   Activity Tolerance Patient tolerated treatment well   Patient Left in bed;with call bell/phone within reach;with bed alarm set;with restraints reapplied (mitts)   Nurse Communication Mobility status        Time: 2902-1115 OT Time Calculation (min): 33 min  Charges: OT General Charges $OT Visit: 1 Visit OT Treatments $Self Care/Home Management : 8-22 mins  Jesse Sans OTR/L Acute Rehabilitation Services Pager: [phone] Office: [place] 09/17/2021, 11:54 AM

## 2021-09-18 DIAGNOSIS — E440 Moderate protein-calorie malnutrition: Secondary | ICD-10-CM

## 2021-09-18 LAB — GLUCOSE, CAPILLARY
Glucose-Capillary: 193 mg/dL — ABNORMAL HIGH (ref 70–99)
Glucose-Capillary: 167 mg/dL — ABNORMAL HIGH (ref 70–99)
Glucose-Capillary: 267 mg/dL — ABNORMAL HIGH (ref 70–99)
Glucose-Capillary: 411 mg/dL — ABNORMAL HIGH (ref 70–99)
Glucose-Capillary: 413 mg/dL — ABNORMAL HIGH (ref 70–99)
Glucose-Capillary: 393 mg/dL — ABNORMAL HIGH (ref 70–99)

## 2021-09-18 LAB — BASIC METABOLIC PANEL
Anion gap: 11 (ref 5–15)
BUN: 23 mg/dL — ABNORMAL HIGH (ref 6–20)
CO2: 25 mmol/L (ref 22–32)
Calcium: 10.7 mg/dL — ABNORMAL HIGH (ref 8.9–10.3)
Chloride: 109 mmol/L (ref 98–111)
Creatinine, Ser: 1.03 mg/dL (ref 0.61–1.24)
GFR, Estimated: >60 mL/min (ref >60)
Glucose, Bld: 167 mg/dL — ABNORMAL HIGH (ref 70–99)
Potassium: 3.8 mmol/L (ref 3.5–5.1)
Sodium: 145 mmol/L (ref 135–145)

## 2021-09-18 LAB — GLUCOSE, RANDOM: Glucose, Bld: 474 mg/dL — ABNORMAL HIGH (ref 70–99)

## 2021-09-18 MED ORDER — LOPERAMIDE HCL 2 MG PO CAPS
2.0000 mg | ORAL_CAPSULE | Freq: Once | ORAL | Status: AC
  Administered 2021-09-18: 2 mg via ORAL
  Filled 2021-09-18: qty 1

## 2021-09-18 MED ORDER — INSULIN GLARGINE-YFGN 100 UNIT/ML ~~LOC~~ SOLN
7.0000 [IU] | Freq: Every day | SUBCUTANEOUS | Status: DC
Start: 2021-09-19 — End: 2021-09-18

## 2021-09-18 MED ORDER — INSULIN ASPART 100 UNIT/ML IJ SOLN
0.0000 [IU] | Freq: Every day | INTRAMUSCULAR | Status: DC
  Administered 2021-09-18 – 2021-09-20 (×2): 5 [IU] via SUBCUTANEOUS

## 2021-09-18 MED ORDER — INSULIN GLARGINE-YFGN 100 UNIT/ML ~~LOC~~ SOLN
14.0000 [IU] | Freq: Every day | SUBCUTANEOUS | Status: DC
  Administered 2021-09-19 – 2021-09-24 (×6): 14 [IU] via SUBCUTANEOUS
  Filled 2021-09-18 (×6): qty 0.14

## 2021-09-18 MED ORDER — INSULIN ASPART 100 UNIT/ML IJ SOLN
0.0000 [IU] | Freq: Three times a day (TID) | INTRAMUSCULAR | Status: DC
  Administered 2021-09-18: 15 [IU] via SUBCUTANEOUS
  Administered 2021-09-18: 8 [IU] via SUBCUTANEOUS
  Administered 2021-09-19: 3 [IU] via SUBCUTANEOUS
  Administered 2021-09-19 (×2): 5 [IU] via SUBCUTANEOUS
  Administered 2021-09-20: 2 [IU] via SUBCUTANEOUS
  Administered 2021-09-20: 5 [IU] via SUBCUTANEOUS
  Administered 2021-09-20 – 2021-09-21 (×2): 2 [IU] via SUBCUTANEOUS
  Administered 2021-09-21: 5 [IU] via SUBCUTANEOUS
  Administered 2021-09-21 – 2021-09-22 (×2): 2 [IU] via SUBCUTANEOUS
  Administered 2021-09-23: 3 [IU] via SUBCUTANEOUS
  Administered 2021-09-25: 2 [IU] via SUBCUTANEOUS
  Administered 2021-09-26: 3 [IU] via SUBCUTANEOUS
  Administered 2021-09-26: 2 [IU] via SUBCUTANEOUS
  Administered 2021-09-27: 3 [IU] via SUBCUTANEOUS
  Administered 2021-09-28 – 2021-09-29 (×2): 2 [IU] via SUBCUTANEOUS

## 2021-09-18 NOTE — Progress Notes (Signed)
This chaplain responded to PMT consult for spiritual care. The chaplain understands the request for a visit was made from the family. ? ?The Pt. is awake and alert, attempting to communicate through utterances. The chaplain did not observe a change of facial expressions or resistance to the chaplain sitting at the bedside or offering to manipulate the volume/channels on the TV.  ? ?The chaplain was not able to understand the Pt. response-"movement of the Pt. head from left to right" during conversation about the Pt. family and friends.  ? ?The Pt. accepted the chaplain's invitation for F/U spiritual care. ? ?Chaplain Stephanie Acre ?[phone] ?

## 2021-09-18 NOTE — Progress Notes (Signed)
?PROGRESS NOTE ? ? ? ?Muzamil Voyles  BVQ:[id number] DOB: [birth year] DOA: 09/05/2021 ?PCP: Dartha Lodge, FNP  ? ? ?Brief Narrative:  ?61 y.o. male with history of HTN, smoking, esophageal varcies presenting after he was found down on the concrete porch of his home.  He has been on the phone speaking with a relative and she heard him fall and he was able to state that he was on the ground and could not get up family member stated that this happened at 1645 on 2/25 and the son was able to get there within 5 minutes, he then called EMS.  On arrival he had decreased alertness, right hemineglect, dysarthric speech.  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  He was admitted to the neuro ICU.  Started on hypertonic saline which was subsequently discontinued.  CT scans were repeated which showed stable findings.  Developed hypernatremia which has improved with free water.  Continues to have significant dysarthria as well as dysphagia. Placement issue.   ?  ? ?   ? ? ? 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  Cerebral edema was noted on imaging studies. ?Patient was admitted to the neuro ICU.  Started on hypertonic saline.  CT head was repeated which showed stable findings with stable midline shift. ?Echocardiogram shows normal systolic function with grade 1 diastolic dysfunction. ?Neurology has signed off. ?PT and OT is following.  Apparently not a good candidate for inpatient rehabilitation due to lack of family support.  Will eventually need to go to skilled nursing facility for rehab. ?-? Prognosis- patient still with functional deficits- discussed with mother on phone for palliative care consult for GOC as unclear how much if any he will recover- appreciate the on-going coversations ? ?Aspiration pneumonia (HCC) ?Patient developed fever on 3/3.  UA was unremarkable and  not suggestive of infection.  Chest x-ray showed left lower lung infiltrate.  Patient with significant oropharyngeal dysphagia.  Likely aspirated.  Started on Augmentin.   ?Seems to have stabilized.  s/p 5-day course. ? ?Hypernatremia ?Likely due to combination of poor oral intake as well as the fact that he was on hypertonic saline.  Sodium level peaked at 166.   ?Patient was started on free water.  ? ? ?Altered mental status ?Likely due to combination of stroke, hyponatremia, alcohol withdrawal.   ?-not sure how much patient will improve-- attempts to communicate but is unable to get words out and is frustrated. ? ?Essential hypertension ?Presented with hypertensive emergency.  Currently on amlodipine and metoprolol.  Blood pressure has improved.  Continue current treatment. ? ?Oropharyngeal dysphagia ?-tube feeds d/c'd 3/9 ?-DYS diet- has to be fed ? ?Alcohol withdrawal (HCC) ?He was placed on CIWA protocol ?-resolved ?Noted to be on thiamine multivitamins folic acid. ? ?Malnutrition of moderate degree ?Nutrition Status: ?Nutrition Problem: Moderate Malnutrition ?Etiology: social / environmental circumstances (EtOH abuse) ?Signs/Symptoms: mild fat depletion, moderate fat depletion, mild muscle depletion, severe muscle depletion ?Interventions: Prostat, Tube feeding, MVI ? ? ? ?Hypokalemia ?Potassium has been repleted.  Magnesium 2.3. ?He is on daily potassium supplement-- may need to d/c ? ?Hyperglycemia ?Add lantus and SSI coverage  ?-SSI TID ? ? ? ?Calcium starting to trend up-- recheck in AM-- may need IVF ? ? ? ? ? ?DVT prophylaxis: heparin injection 5,000 Units Start: 09/12/21 2200 ?SCD's Start: 09/05/21 1942 ? ?  Code Status: Full Code ?Family Communication:  ? ?  Disposition Plan:  ?Level of care: Progressive ?Status is: Inpatient ?Remains inpatient appropriate because: needs placement ?  ? ?Consultants:  ?Neurology ?Palliative care ? ? ?Subjective: ?No SOB, covered in stool ? ?Objective: ?Vitals:  ?  09/17/21 2120 09/17/21 2300 09/18/21 0300 09/18/21 0700  ?BP: 135/85 123/84  113/72  ?Pulse: (!) 105 (!) 109  88  ?Resp:  20  13  ?Temp:  98.2 ?F (36.8 ?C) 98 ?F (36.7 ?C) 98 ?F (36.7 ?C)  ?TempSrc:  Oral Axillary Oral  ?SpO2:  96%  96%  ?Weight:   72.6 kg   ?Height:      ? ? ?Intake/Output Summary (Last 24 hours) at 09/18/2021 1154 ?Last data filed at 09/18/2021 0908 ?Gross per 24 hour  ?Intake 2422 ml  ?Output 1250 ml  ?Net 1172 ml  ? ?Filed Weights  ? 09/16/21 0500 09/17/21 0350 09/18/21 0300  ?Weight: 74.9 kg 73.3 kg 72.6 kg  ? ? ?Examination: ? ? ?General: Appearance:     ?Overweight male in no acute distress  ?   ?Lungs:     respirations unlabored  ?Heart:    Normal heart rate.  ?  ?MS:   All extremities are intact.  ?  ?Neurologic:   Awake, alert, unable to clearly communicate  ?  ? ? ? ?Data Reviewed: I have personally reviewed following labs and imaging studies ? ?CBC: ?Recent Labs  ?Lab 09/12/21 ?9798 09/13/21 ?0302 09/16/21 ?9211  ?WBC 6.7 7.2 6.5  ?HGB 12.9* 12.4* 11.9*  ?HCT 39.0 37.9* 35.6*  ?MCV 98.2 100.0 100.0  ?PLT 128* 136* 197  ? ?Basic Metabolic Panel: ?Recent Labs  ?Lab 09/14/21 ?0309 09/15/21 ?9417 09/16/21 ?4081 09/17/21 ?0410 09/18/21 ?0431  ?NA 147* 142 135 140 145  ?K 3.8 3.9 4.1 3.8 3.8  ?CL 115* 111 104 106 109  ?CO2 23 22 22 23 25   ?GLUCOSE 123* 231* 302* 329* 167*  ?BUN 18 16 17 18  23*  ?CREATININE 1.01 1.01 1.00 1.00 1.03  ?CALCIUM 9.7 9.6 9.5 10.1 10.7*  ?MG 2.3  --   --   --   --   ?PHOS 4.3  --   --   --   --   ? ?GFR: ?Estimated Creatinine Clearance: 68.8 mL/min (by C-G formula based on SCr of 1.03 mg/dL). ?Liver Function Tests: ?No results for input(s): AST, ALT, ALKPHOS, BILITOT, PROT, ALBUMIN in the last 168 hours. ?No results for input(s): LIPASE, AMYLASE in the last 168 hours. ?No results for input(s): AMMONIA in the last 168 hours. ?Coagulation Profile: ?No results for input(s): INR, PROTIME in the last 168 hours. ?Cardiac Enzymes: ?No results for input(s): CKTOTAL, CKMB,  CKMBINDEX, TROPONINI in the last 168 hours. ?BNP (last 3 results) ?No results for input(s): PROBNP in the last 8760 hours. ?HbA1C: ?No results for input(s): HGBA1C in the last 72 hours. ?CBG: ?Recent Labs  ?Lab 09/17/21 ?1604 09/17/21 ?1930 09/17/21 ?2302 09/18/21 ?0302 09/18/21 ?11/18/21  ?GLUCAP 374* 386* 301* 193* 167*  ? ?Lipid Profile: ?No results for input(s): CHOL, HDL, LDLCALC, TRIG, CHOLHDL, LDLDIRECT in the last 72 hours. ?Thyroid Function Tests: ?No results for input(s): TSH, T4TOTAL, FREET4, T3FREE, THYROIDAB in the last 72 hours. ?Anemia Panel: ?No results for input(s): VITAMINB12, FOLATE, FERRITIN, TIBC, IRON, RETICCTPCT in the last 72 hours. ?Sepsis Labs: ?No results for input(s): PROCALCITON, LATICACIDVEN in the last 168 hours. ? ?No results found for this or any previous visit (from the past 240 hour(s)).  ? ? ? ? ? ?Radiology Studies: ?No results  found. ? ? ? ? ? ?Scheduled Meds: ? amLODipine  10 mg Per Tube Daily  ? vitamin C  500 mg Per Tube BID  ? chlorhexidine  15 mL Mouth Rinse BID  ? Chlorhexidine Gluconate Cloth  6 each Topical Daily  ? cholecalciferol  1,000 Units Per Tube Daily  ? folic acid  1 mg Per Tube Daily  ? heparin injection (subcutaneous)  5,000 Units Subcutaneous Q8H  ? insulin aspart  0-15 Units Subcutaneous TID WC  ? insulin aspart  0-5 Units Subcutaneous QHS  ? [START ON 09/19/2021] insulin glargine-yfgn  14 Units Subcutaneous Daily  ? mouth rinse  15 mL Mouth Rinse q12n4p  ? mouth rinse  15 mL Mouth Rinse q12n4p  ? metoprolol tartrate  25 mg Per Tube BID  ? multivitamin with minerals  1 tablet Per Tube Daily  ? pantoprazole sodium  40 mg Per Tube QHS  ? potassium chloride  40 mEq Per Tube Daily  ? ?Continuous Infusions: ? ? LOS: 13 days  ? ? ?Time spent: 45 minutes spent on chart review, discussion with nursing staff, consultants, updating family and interview/physical exam; more than 50% of that time was spent in counseling and/or coordination of care. ? ? ? ?Joseph ArtJessica U Janise Gora,  DO ?[facility] ?Available via Epic secure chat 7am-7pm ?After these hours, please refer to coverage provider listed on [email or web address] ?09/18/2021, 11:54 AM   ?

## 2021-09-19 LAB — GLUCOSE, CAPILLARY
Glucose-Capillary: 170 mg/dL — ABNORMAL HIGH (ref 70–99)
Glucose-Capillary: 230 mg/dL — ABNORMAL HIGH (ref 70–99)
Glucose-Capillary: 252 mg/dL — ABNORMAL HIGH (ref 70–99)
Glucose-Capillary: 229 mg/dL — ABNORMAL HIGH (ref 70–99)
Glucose-Capillary: 153 mg/dL — ABNORMAL HIGH (ref 70–99)

## 2021-09-19 LAB — BASIC METABOLIC PANEL
Anion gap: 10 (ref 5–15)
BUN: 25 mg/dL — ABNORMAL HIGH (ref 6–20)
CO2: 25 mmol/L (ref 22–32)
Calcium: 10.8 mg/dL — ABNORMAL HIGH (ref 8.9–10.3)
Chloride: 110 mmol/L (ref 98–111)
Creatinine, Ser: 1.01 mg/dL (ref 0.61–1.24)
GFR, Estimated: >60 mL/min (ref >60)
Glucose, Bld: 257 mg/dL — ABNORMAL HIGH (ref 70–99)
Potassium: 4.3 mmol/L (ref 3.5–5.1)
Sodium: 145 mmol/L (ref 135–145)

## 2021-09-19 LAB — CBC
HCT: 37.9 % — ABNORMAL LOW (ref 39.0–52.0)
Hemoglobin: 12.6 g/dL — ABNORMAL LOW (ref 13.0–17.0)
MCH: 32.7 pg (ref 26.0–34.0)
MCHC: 33.2 g/dL (ref 30.0–36.0)
MCV: 98.4 fL (ref 80.0–100.0)
Platelets: 292 10*3/uL (ref 150–400)
RBC: 3.85 MIL/uL — ABNORMAL LOW (ref 4.22–5.81)
RDW: 13.2 % (ref 11.5–15.5)
WBC: 7.7 10*3/uL (ref 4.0–10.5)
nRBC: 0 % (ref 0.0–0.2)

## 2021-09-19 MED ORDER — AMLODIPINE BESYLATE 10 MG PO TABS
10.0000 mg | ORAL_TABLET | Freq: Every day | ORAL | Status: DC
  Administered 2021-09-20 – 2021-10-01 (×12): 10 mg via ORAL
  Filled 2021-09-19 (×12): qty 1

## 2021-09-19 MED ORDER — ADULT MULTIVITAMIN W/MINERALS CH
1.0000 | ORAL_TABLET | Freq: Every day | ORAL | Status: DC
  Administered 2021-09-20 – 2021-10-01 (×12): 1 via ORAL
  Filled 2021-09-19 (×12): qty 1

## 2021-09-19 MED ORDER — SODIUM CHLORIDE 0.9 % IV SOLN: INTRAVENOUS | Status: DC

## 2021-09-19 MED ORDER — ASCORBIC ACID 500 MG PO TABS
500.0000 mg | ORAL_TABLET | Freq: Two times a day (BID) | ORAL | Status: DC
  Administered 2021-09-19 – 2021-10-01 (×22): 500 mg via ORAL
  Filled 2021-09-19 (×23): qty 1

## 2021-09-19 MED ORDER — FOLIC ACID 1 MG PO TABS
1.0000 mg | ORAL_TABLET | Freq: Every day | ORAL | Status: DC
  Administered 2021-09-20 – 2021-10-01 (×12): 1 mg via ORAL
  Filled 2021-09-19 (×12): qty 1

## 2021-09-19 MED ORDER — PANTOPRAZOLE SODIUM 40 MG PO TBEC
40.0000 mg | DELAYED_RELEASE_TABLET | Freq: Every day | ORAL | Status: DC
  Administered 2021-09-19 – 2021-09-30 (×11): 40 mg via ORAL
  Filled 2021-09-19 (×12): qty 1

## 2021-09-19 MED ORDER — VITAMIN D 25 MCG (1000 UNIT) PO TABS
1000.0000 [IU] | ORAL_TABLET | Freq: Every day | ORAL | Status: DC
  Administered 2021-09-20 – 2021-10-01 (×12): 1000 [IU] via ORAL
  Filled 2021-09-19 (×12): qty 1

## 2021-09-19 MED ORDER — METOPROLOL TARTRATE 25 MG PO TABS
25.0000 mg | ORAL_TABLET | Freq: Two times a day (BID) | ORAL | Status: DC
  Administered 2021-09-19 – 2021-10-01 (×23): 25 mg via ORAL
  Filled 2021-09-19 (×24): qty 1

## 2021-09-19 MED ORDER — POTASSIUM CHLORIDE CRYS ER 20 MEQ PO TBCR
40.0000 meq | EXTENDED_RELEASE_TABLET | Freq: Every day | ORAL | Status: DC
  Administered 2021-09-20 – 2021-09-21 (×2): 40 meq via ORAL
  Filled 2021-09-19 (×2): qty 2

## 2021-09-19 NOTE — Progress Notes (Signed)
PROGRESS NOTE    Joshua Price  ZHY:[id number] DOB: January 14, 1961 DOA: 09/05/2021 PCP: Dartha Lodge, FNP    Brief Narrative:  61 y.o. male with history of HTN, smoking, esophageal varcies presenting after he was found down on the concrete porch of his home.  He has been on the phone speaking with a relative and she heard him fall and he was able to state that he was on the ground and could not get up family member stated that this happened at 1645 on 2/25 and the son was able to get there within 5 minutes, he then called EMS.  On arrival he had decreased alertness, right hemineglect, dysarthric speech.  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  He was admitted to the neuro ICU.  CT scans were repeated which showed stable findings.   Continues to have significant dysarthria.  Placement issue.            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  Cerebral edema was noted on imaging studies. -admitted to the neuro ICU.  Started on hypertonic saline.  CT head was repeated which showed stable findings with stable midline shift. -Echocardiogram shows normal systolic function with grade 1 diastolic dysfunction. -Neurology has signed off. -PT and OT is following.  Apparently not a good candidate for inpatient rehabilitation due to lack of family support.  Will eventually need to go to skilled nursing facility for rehab but currently no payer source -? Prognosis- patient still with functional deficits- discussed with mother on phone for palliative care consult for GOC as unclear how much if any he will recover- appreciate the on-going coversations  Aspiration pneumonia [facility]) Patient developed fever on 3/3.  UA was unremarkable and not suggestive of infection.  Chest x-ray showed left lower lung infiltrate.  Patient with significant oropharyngeal dysphagia.   Likely aspirated.  Started on Augmentin.   -s/p 5-day course.  Hypernatremia Likely due to combination of poor oral intake as well as the fact that he was on hypertonic saline.  Sodium level peaked at 166.   Patient was started on free water.  -resolved   Altered mental status Likely due to combination of stroke, hyponatremia, alcohol withdrawal.   -not sure how much patient will improve-- attempts to communicate but is unable to get words out and is frustrated.  Essential hypertension Presented with hypertensive emergency.  Currently on amlodipine and metoprolol.  Blood pressure has improved.  Continue current treatment.  Oropharyngeal dysphagia -tube feeds d/c'd 3/9 -DYS diet- has to be fed -aspiration precautions  Alcohol withdrawal (HCC) He was placed on CIWA protocol -resolved Noted to be on thiamine multivitamins folic acid.  Malnutrition of moderate degree Nutrition Status: Nutrition Problem: Moderate Malnutrition Etiology: social / environmental circumstances (EtOH abuse) Signs/Symptoms: mild fat depletion, moderate fat depletion, mild muscle depletion, severe muscle depletion Interventions: Prostat, Tube feeding, MVI    Hypokalemia Potassium has been repleted.  Magnesium 2.3. He is on daily potassium supplement-- may need to d/c  Hypercalcemia -gentle IVF for 24 hours -recheck in AM -not on any supplements  Hyperglycemia Add lantus and SSI coverage  -adjust as able    Calcium starting to trend up-- recheck in AM-- may need IVF      DVT prophylaxis: heparin injection 5,000 Units Start: 09/12/21 2200 SCD's Start: 09/05/21 1942    Code Status: Full Code Family Communication:   Disposition Plan:  Level of care: Progressive Status is: Inpatient Remains inpatient appropriate because: needs placement    Consultants:  Neurology Palliative care   Subjective: No SOB, covered in stool  Objective: Vitals:   09/18/21 2312 09/19/21 0311  09/19/21 0500 09/19/21 0712  BP: 123/82 122/83  134/90  Pulse: 94 87  99  Resp: 19 17  17   Temp: 98.8 F (37.1 C) 98.5 F (36.9 C)  98.2 F (36.8 C)  TempSrc: Oral Axillary  Oral  SpO2: 96% 95%  94%  Weight:   72.6 kg   Height:        Intake/Output Summary (Last 24 hours) at 09/19/2021 1028 Last data filed at 09/19/2021 0830 Gross per 24 hour  Intake 1760 ml  Output 1300 ml  Net 460 ml   Filed Weights   09/17/21 0350 09/18/21 0300 09/19/21 0500  Weight: 73.3 kg 72.6 kg 72.6 kg    Examination:   General: Appearance:     Overweight male in no acute distress     Lungs:     respirations unlabored  Heart:    Normal heart rate.    MS:   All extremities are intact.    Neurologic:   Awake, alert, unable to clearly communicate       Data Reviewed: I have personally reviewed following labs and imaging studies  CBC: Recent Labs  Lab 09/13/21 0302 09/16/21 0649 09/19/21 0340  WBC 7.2 6.5 7.7  HGB 12.4* 11.9* 12.6*  HCT 37.9* 35.6* 37.9*  MCV 100.0 100.0 98.4  PLT 136* 197 [id number]   Basic Metabolic Panel: Recent Labs  Lab 09/14/21 0309 09/15/21 0328 09/16/21 0649 09/17/21 0410 09/18/21 0431 09/18/21 1803 09/19/21 0340  NA 147* 142 135 140 145  --  145  K 3.8 3.9 4.1 3.8 3.8  --  4.3  CL 115* 111 104 106 109  --  110  CO2 23 22 22 23 25   --  25  GLUCOSE 123* 231* 302* 329* 167* 474* 257*  BUN 18 16 17 18  23*  --  25*  CREATININE 1.01 1.01 1.00 1.00 1.03  --  1.01  CALCIUM 9.7 9.6 9.5 10.1 10.7*  --  10.8*  MG 2.3  --   --   --   --   --   --   PHOS 4.3  --   --   --   --   --   --    GFR: Estimated Creatinine Clearance: 70.2 mL/min (by C-G formula based on SCr of 1.01 mg/dL). Liver Function Tests: No results for input(s): AST, ALT, ALKPHOS, BILITOT, PROT, ALBUMIN in the last 168 hours. No results for input(s): LIPASE, AMYLASE in the last 168 hours. No results for input(s): AMMONIA in the last 168 hours. Coagulation Profile: No results for input(s): INR,  PROTIME in the last 168 hours. Cardiac Enzymes: No results for input(s): CKTOTAL, CKMB, CKMBINDEX, TROPONINI in the last 168 hours. BNP (last 3 results) No results for input(s): PROBNP in the last 8760 hours. HbA1C: No results for input(s): HGBA1C in the last 72 hours. CBG: Recent Labs  Lab 09/18/21 1156 09/18/21 1613 09/18/21 1752 09/18/21 2108 09/19/21 0803  GLUCAP 267* 411* 413* 393* 170*   Lipid Profile: No results for input(s): CHOL, HDL, LDLCALC, TRIG, CHOLHDL, LDLDIRECT in the last 72 hours. Thyroid Function Tests: No results for input(s): TSH, T4TOTAL, FREET4, T3FREE, THYROIDAB in the last 72 hours. Anemia Panel: No results for input(s): VITAMINB12, FOLATE, FERRITIN, TIBC, IRON, RETICCTPCT in the  last 72 hours. Sepsis Labs: No results for input(s): PROCALCITON, LATICACIDVEN in the last 168 hours.  No results found for this or any previous visit (from the past 240 hour(s)).       Radiology Studies: No results found.      Scheduled Meds:  amLODipine  10 mg Per Tube Daily   vitamin C  500 mg Per Tube BID   chlorhexidine  15 mL Mouth Rinse BID   Chlorhexidine Gluconate Cloth  6 each Topical Daily   cholecalciferol  1,000 Units Per Tube Daily   folic acid  1 mg Per Tube Daily   heparin injection (subcutaneous)  5,000 Units Subcutaneous Q8H   insulin aspart  0-15 Units Subcutaneous TID WC   insulin aspart  0-5 Units Subcutaneous QHS   insulin glargine-yfgn  14 Units Subcutaneous Daily   mouth rinse  15 mL Mouth Rinse q12n4p   mouth rinse  15 mL Mouth Rinse q12n4p   metoprolol tartrate  25 mg Per Tube BID   multivitamin with minerals  1 tablet Per Tube Daily   pantoprazole sodium  40 mg Per Tube QHS   potassium chloride  40 mEq Per Tube Daily   Continuous Infusions:  sodium chloride 100 mL/hr at 09/19/21 0852     LOS: 14 days    Time spent: 45 minutes spent on chart review, discussion with nursing staff, consultants, updating family and  interview/physical exam; more than 50% of that time was spent in counseling and/or coordination of care.    Geradine Girt, DO [facility] Available via Epic secure chat 7am-7pm After these hours, please refer to coverage provider listed on [email or web address] 09/19/2021, 10:28 AM

## 2021-09-19 NOTE — Assessment & Plan Note (Addendum)
-  gentle IVF for 24 hours -not on any supplements -improved with fluid

## 2021-09-20 LAB — CBC
HCT: 37.3 % — ABNORMAL LOW (ref 39.0–52.0)
Hemoglobin: 12.4 g/dL — ABNORMAL LOW (ref 13.0–17.0)
MCH: 33.2 pg (ref 26.0–34.0)
MCHC: 33.2 g/dL (ref 30.0–36.0)
MCV: 100 fL (ref 80.0–100.0)
Platelets: 311 10*3/uL (ref 150–400)
RBC: 3.73 MIL/uL — ABNORMAL LOW (ref 4.22–5.81)
RDW: 13.2 % (ref 11.5–15.5)
WBC: 8.4 10*3/uL (ref 4.0–10.5)
nRBC: 0 % (ref 0.0–0.2)

## 2021-09-20 LAB — BASIC METABOLIC PANEL
Anion gap: 9 (ref 5–15)
BUN: 21 mg/dL — ABNORMAL HIGH (ref 6–20)
CO2: 24 mmol/L (ref 22–32)
Calcium: 10.2 mg/dL (ref 8.9–10.3)
Chloride: 116 mmol/L — ABNORMAL HIGH (ref 98–111)
Creatinine, Ser: 0.92 mg/dL (ref 0.61–1.24)
GFR, Estimated: >60 mL/min (ref >60)
Glucose, Bld: 135 mg/dL — ABNORMAL HIGH (ref 70–99)
Potassium: 3.8 mmol/L (ref 3.5–5.1)
Sodium: 149 mmol/L — ABNORMAL HIGH (ref 135–145)

## 2021-09-20 LAB — GLUCOSE, CAPILLARY
Glucose-Capillary: 130 mg/dL — ABNORMAL HIGH (ref 70–99)
Glucose-Capillary: 206 mg/dL — ABNORMAL HIGH (ref 70–99)
Glucose-Capillary: 123 mg/dL — ABNORMAL HIGH (ref 70–99)
Glucose-Capillary: 207 mg/dL — ABNORMAL HIGH (ref 70–99)

## 2021-09-20 MED ORDER — SODIUM CHLORIDE 0.45 % IV SOLN: INTRAVENOUS | Status: DC

## 2021-09-20 NOTE — Progress Notes (Signed)
PROGRESS NOTE    Joshua Price  TOI:[id number] DOB: [birth year] DOA: 09/05/2021 PCP: Dartha Lodge, FNP    Brief Narrative:  61 y.o. male with history of HTN, smoking, esophageal varcies presenting after he was found down on the concrete porch of his home.  He has been on the phone speaking with a relative and she heard him fall and he was able to state that he was on the ground and could not get up family member stated that this happened at 1645 on 2/25 and the son was able to get there within 5 minutes, he then called EMS.  On arrival he had decreased alertness, right hemineglect, dysarthric speech.  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  He was admitted to the neuro ICU.  CT scans were repeated which showed stable findings.   Continues to have significant dysarthria.  Placement issue.           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  Cerebral edema was noted on imaging studies. -admitted to the neuro ICU.  Started on hypertonic saline.  CT head was repeated which showed stable findings with stable midline shift. -Echocardiogram shows normal systolic function with grade 1 diastolic dysfunction. -Neurology has signed off. -PT and OT is following.  Apparently not a good candidate for inpatient rehabilitation due to lack of family support.  Will eventually need to go to skilled nursing facility for rehab but currently no payer source -? Prognosis- patient still with functional deficits- discussed with mother on phone for palliative care consult for GOC as unclear how much if any he will recover- appreciate the on-going coversations with palliative care -paperwork left by mother (FMLA?) - filled out and on chart  Aspiration pneumonia [facility]) Patient developed fever on 3/3.  UA was unremarkable and not suggestive of infection.  Chest x-ray showed  left lower lung infiltrate.  Patient with significant oropharyngeal dysphagia.  Likely aspirated.  Started on Augmentin.   -s/p 5-day course. -may have aspirated again-- may need to resume if spikes temperature  Hypernatremia Likely due to combination of poor oral intake as well as the fact that he was on hypertonic saline.  Sodium level peaked at 166.   -encourage PO intake -changed IVF to 1/2 NS (had some hypercalcemia)   Altered mental status Likely due to combination of stroke, hyponatremia, alcohol withdrawal.   -not sure how much patient will improve-- attempts to communicate but is unable to get words out and is frustrated.  Essential hypertension Presented with hypertensive emergency.  Currently on amlodipine and metoprolol.  Blood pressure has improved.  Continue current treatment.  Oropharyngeal dysphagia -tube feeds d/c'd 3/9 -DYS diet- has to be fed -aspiration precautions  Alcohol withdrawal (HCC) -s/p CIWA protocol -resolved Noted to be on thiamine multivitamins folic acid.  Malnutrition of moderate degree Nutrition Status: Nutrition Problem: Moderate Malnutrition Etiology: social / environmental circumstances (EtOH abuse) Signs/Symptoms: mild fat depletion, moderate fat depletion, mild muscle depletion, severe muscle depletion Interventions: Prostat, Tube feeding, MVI  -off tube feeding now   Hypokalemia Potassium has been repleted.  Magnesium 2.3. He is on daily potassium supplement-- may need to d/c  Hypercalcemia -gentle IVF for 24 hours -not on any supplements -improved with fluid  Hyperglycemia Add lantus and SSI coverage  -adjust as able    Calcium starting to trend up-- recheck in AM-- may need IVF  DVT prophylaxis: heparin injection 5,000 Units Start: 09/12/21 2200 SCD's Start: 09/05/21 1942    Code Status: Full Code Family Communication:   Disposition Plan:  Level of care: Progressive Status is: Inpatient Remains inpatient  appropriate because: needs placement    Consultants:  Neurology Palliative care   Subjective: Not trying to communicate today but will nod head to yes/no questions   Objective: Vitals:   09/19/21 2321 09/20/21 0312 09/20/21 0743 09/20/21 1142  BP: 131/80 (!) (P) 142/95 (!) 145/89 (!) 141/78  Pulse: 79 (P) 91 (!) 109 99  Resp: 20 (P) 18 16 (!) 23  Temp: 98.1 F (36.7 C) (P) 98 F (36.7 C) 99.2 F (37.3 C) 98 F (36.7 C)  TempSrc: Oral (P) Oral Axillary Axillary  SpO2: 95% (P) 96% 92% 93%  Weight:      Height:        Intake/Output Summary (Last 24 hours) at 09/20/2021 1240 Last data filed at 09/20/2021 0825 Gross per 24 hour  Intake 1695.09 ml  Output 1350 ml  Net 345.09 ml   Filed Weights   09/17/21 0350 09/18/21 0300 09/19/21 0500  Weight: 73.3 kg 72.6 kg 72.6 kg    Examination:  General: Appearance:     Overweight male in no acute distress- laying flat in bed     Lungs:     respirations unlabored  Heart:    Normal heart rate.    MS:   All extremities are intact.    Neurologic:   Awake, less responsive today but will nod head         Data Reviewed: I have personally reviewed following labs and imaging studies  CBC: Recent Labs  Lab 09/16/21 0649 09/19/21 0340 09/20/21 0149  WBC 6.5 7.7 8.4  HGB 11.9* 12.6* 12.4*  HCT 35.6* 37.9* 37.3*  MCV 100.0 98.4 100.0  PLT 197 292 311   Basic Metabolic Panel: Recent Labs  Lab 09/14/21 0309 09/15/21 0328 09/16/21 0649 09/17/21 0410 09/18/21 0431 09/18/21 1803 09/19/21 0340 09/20/21 0149  NA 147*   < > 135 140 145  --  145 149*  K 3.8   < > 4.1 3.8 3.8  --  4.3 3.8  CL 115*   < > 104 106 109  --  110 116*  CO2 23   < > 22 23 25   --  25 24  GLUCOSE 123*   < > 302* 329* 167* 474* 257* 135*  BUN 18   < > 17 18 23*  --  25* 21*  CREATININE 1.01   < > 1.00 1.00 1.03  --  1.01 0.92  CALCIUM 9.7   < > 9.5 10.1 10.7*  --  10.8* 10.2  MG 2.3  --   --   --   --   --   --   --   PHOS 4.3  --   --   --    --   --   --   --    < > = values in this interval not displayed.   GFR: Estimated Creatinine Clearance: 77.1 mL/min (by C-G formula based on SCr of 0.92 mg/dL). Liver Function Tests: No results for input(s): AST, ALT, ALKPHOS, BILITOT, PROT, ALBUMIN in the last 168 hours. No results for input(s): LIPASE, AMYLASE in the last 168 hours. No results for input(s): AMMONIA in the last 168 hours. Coagulation Profile: No results for input(s): INR, PROTIME in the last 168 hours. Cardiac Enzymes: No results for input(s):  CKTOTAL, CKMB, CKMBINDEX, TROPONINI in the last 168 hours. BNP (last 3 results) No results for input(s): PROBNP in the last 8760 hours. HbA1C: No results for input(s): HGBA1C in the last 72 hours. CBG: Recent Labs  Lab 09/19/21 1117 09/19/21 1536 09/19/21 1654 09/19/21 2110 09/20/21 0740  GLUCAP 230* 252* 229* 153* 130*   Lipid Profile: No results for input(s): CHOL, HDL, LDLCALC, TRIG, CHOLHDL, LDLDIRECT in the last 72 hours. Thyroid Function Tests: No results for input(s): TSH, T4TOTAL, FREET4, T3FREE, THYROIDAB in the last 72 hours. Anemia Panel: No results for input(s): VITAMINB12, FOLATE, FERRITIN, TIBC, IRON, RETICCTPCT in the last 72 hours. Sepsis Labs: No results for input(s): PROCALCITON, LATICACIDVEN in the last 168 hours.  No results found for this or any previous visit (from the past 240 hour(s)).       Radiology Studies: No results found.      Scheduled Meds:  amLODipine  10 mg Oral Daily   vitamin C  500 mg Oral BID   chlorhexidine  15 mL Mouth Rinse BID   Chlorhexidine Gluconate Cloth  6 each Topical Daily   cholecalciferol  1,000 Units Oral Daily   folic acid  1 mg Oral Daily   heparin injection (subcutaneous)  5,000 Units Subcutaneous Q8H   insulin aspart  0-15 Units Subcutaneous TID WC   insulin aspart  0-5 Units Subcutaneous QHS   insulin glargine-yfgn  14 Units Subcutaneous Daily   mouth rinse  15 mL Mouth Rinse q12n4p    metoprolol tartrate  25 mg Oral BID   multivitamin with minerals  1 tablet Oral Daily   pantoprazole  40 mg Oral QHS   potassium chloride  40 mEq Oral Daily   Continuous Infusions:  sodium chloride 75 mL/hr at 09/20/21 0825     LOS: 15 days    Time spent: 45 minutes spent on chart review, discussion with nursing staff, consultants, updating family and interview/physical exam; more than 50% of that time was spent in counseling and/or coordination of care.    Joseph ArtJessica U Shalamar Plourde, DO [facility] Available via Epic secure chat 7am-7pm After these hours, please refer to coverage provider listed on [email or web address] 09/20/2021, 12:40 PM

## 2021-09-21 ENCOUNTER — Inpatient Hospital Stay (HOSPITAL_COMMUNITY): Payer: Medicaid Other

## 2021-09-21 LAB — GLUCOSE, CAPILLARY
Glucose-Capillary: 125 mg/dL — ABNORMAL HIGH (ref 70–99)
Glucose-Capillary: 233 mg/dL — ABNORMAL HIGH (ref 70–99)
Glucose-Capillary: 144 mg/dL — ABNORMAL HIGH (ref 70–99)
Glucose-Capillary: 165 mg/dL — ABNORMAL HIGH (ref 70–99)

## 2021-09-21 LAB — BASIC METABOLIC PANEL
Anion gap: 9 (ref 5–15)
BUN: 22 mg/dL — ABNORMAL HIGH (ref 6–20)
CO2: 26 mmol/L (ref 22–32)
Calcium: 9.9 mg/dL (ref 8.9–10.3)
Chloride: 116 mmol/L — ABNORMAL HIGH (ref 98–111)
Creatinine, Ser: 1.06 mg/dL (ref 0.61–1.24)
GFR, Estimated: >60 mL/min (ref >60)
Glucose, Bld: 137 mg/dL — ABNORMAL HIGH (ref 70–99)
Potassium: 3.6 mmol/L (ref 3.5–5.1)
Sodium: 151 mmol/L — ABNORMAL HIGH (ref 135–145)

## 2021-09-21 LAB — CBC
HCT: 38.7 % — ABNORMAL LOW (ref 39.0–52.0)
Hemoglobin: 12.6 g/dL — ABNORMAL LOW (ref 13.0–17.0)
MCH: 33.1 pg (ref 26.0–34.0)
MCHC: 32.6 g/dL (ref 30.0–36.0)
MCV: 101.6 fL — ABNORMAL HIGH (ref 80.0–100.0)
Platelets: 309 10*3/uL (ref 150–400)
RBC: 3.81 MIL/uL — ABNORMAL LOW (ref 4.22–5.81)
RDW: 13.2 % (ref 11.5–15.5)
WBC: 8.2 10*3/uL (ref 4.0–10.5)
nRBC: 0 % (ref 0.0–0.2)

## 2021-09-21 MED ORDER — POTASSIUM CHLORIDE 20 MEQ PO PACK: 40.0000 meq | PACK | Freq: Every day | ORAL | Status: DC

## 2021-09-21 MED ORDER — POTASSIUM CHLORIDE 20 MEQ PO PACK
40.0000 meq | PACK | Freq: Every day | ORAL | Status: DC
  Administered 2021-09-22 – 2021-09-24 (×3): 40 meq via ORAL
  Filled 2021-09-21 (×3): qty 2

## 2021-09-21 NOTE — Progress Notes (Addendum)
Physical Therapy Treatment ?Patient Details ?Name: Joshua Price ?MRN: [id number] ?DOB: [birth year] ?Today's Date: 09/21/2021 ? ? ?History of Present Illness 60 yo male presents to [facility] on 2/25 with fall at home, + ETOH, BP 218/134. CT head reveals acute parenchymal hemorrhage involving left lentiform nucleus and  surrounding white matter; repeat CT shows interval increase in side of intraparenchymal hematoma centered in L BG, increased edema and 37mm L-to-R midline shift. PMH includes HTN, smoker, esophageal varices. ? ?  ?PT Comments  ? ? Pt required mod assist rolling, and +1-2 max assist supine <>. Pushing to R in sitting. Persistent R side inattention. Unable to get pt to look at therapist on R side. Pt returned to supine at end of session. He is scheduled to undergo MBS early afternoon. ?   ?Recommendations for follow up therapy are one component of a multi-disciplinary discharge planning process, led by the attending physician.  Recommendations may be updated based on patient status, additional functional criteria and insurance authorization. ? ?Follow Up Recommendations ? Skilled nursing-short term rehab (<3 hours/day) ?  ?  ?Assistance Recommended at Discharge    ?Patient can return home with the following Two people to help with walking and/or transfers;Two people to help with bathing/dressing/bathroom;Direct supervision/assist for medications management;Direct supervision/assist for financial management;Assist for transportation;Help with stairs or ramp for entrance;Assistance with cooking/housework;Assistance with feeding ?  ?Equipment Recommendations ? Wheelchair (measurements PT);Wheelchair cushion (measurements PT);BSC/3in1;Other (comment)  ?  ?Recommendations for Other Services   ? ? ?  ?Precautions / Restrictions Precautions ?Precautions: Fall ?Precaution Comments: Pt pushes to the R  ?  ? ?Mobility ? Bed Mobility ?Overal bed mobility: Needs Assistance ?Bed Mobility: Rolling, Supine to Sit, Sit to  Supine ?Rolling: Mod assist ?  ?Supine to sit: Max assist ?Sit to supine: Max assist, +2 for physical assistance ?  ?General bed mobility comments: increased assist for return to bed and repostioning as compared to  initial transition to EOB. Assist for all aspects of mobility ?  ? ?Transfers ?  ?  ?  ?  ?  ?  ?  ?  ?  ?General transfer comment: Pt remained in bed at end of session. Scheduled for  MBS. ?  ? ?Ambulation/Gait ?  ?  ?  ?  ?  ?  ?  ?General Gait Details: unable ? ? ?Stairs ?  ?  ?  ?  ?  ? ? ?Wheelchair Mobility ?  ? ?Modified Rankin (Stroke Patients Only) ?Modified Rankin (Stroke Patients Only) ?Pre-Morbid Rankin Score: No significant disability ?Modified Rankin: Severe disability ? ? ?  ?Balance Overall balance assessment: Needs assistance ?Sitting-balance support: Single extremity supported, Feet supported ?Sitting balance-Leahy Scale: Poor ?  ?  ?  ?  ?  ?  ?  ?  ?  ?  ?  ?  ?  ?  ?  ?  ?  ? ?  ?Cognition Arousal/Alertness: Awake/alert ?Behavior During Therapy: Restless ?Overall Cognitive Status: Impaired/Different from baseline ?Area of Impairment: Following commands, Awareness, Problem solving, Attention, Safety/judgement ?  ?  ?  ?  ?  ?  ?  ?  ?  ?Current Attention Level: Sustained ?  ?Following Commands: Follows one step commands inconsistently, Follows one step commands with increased time ?Safety/Judgement: Decreased awareness of safety, Decreased awareness of deficits ?Awareness: Intellectual ?Problem Solving: Decreased initiation, Difficulty sequencing, Requires verbal cues, Requires tactile cues ?General Comments: Difficult to understand. Garbled speech, difficult to discern expressive vs receptive deficits. Following simple commands 25%  of time ?  ?  ? ?  ?Exercises   ? ?  ?General Comments   ?  ?  ? ?Pertinent Vitals/Pain Pain Assessment ?Pain Assessment: Faces ?Faces Pain Scale: No hurt  ? ? ?Home Living   ?  ?  ?  ?  ?  ?  ?  ?  ?  ?   ?  ?Prior Function    ?  ?  ?   ? ?PT Goals  (current goals can now be found in the care plan section) Acute Rehab PT Goals ?Patient Stated Goal: unable to state ?PT Goal Formulation: Patient unable to participate in goal setting ?Time For Goal Achievement: 10/05/21 ?Potential to Achieve Goals: Good ?Progress towards PT goals: Progressing toward goals ? ?  ?Frequency ? ? ? Min 3X/week ? ? ? ?  ?PT Plan Frequency needs to be updated  ? ? ?Co-evaluation   ?  ?  ?  ?  ? ?  ?AM-PAC PT "6 Clicks" Mobility   ?Outcome Measure ? Help needed turning from your back to your side while in a flat bed without using bedrails?: A Lot ?Help needed moving from lying on your back to sitting on the side of a flat bed without using bedrails?: A Lot ?Help needed moving to and from a bed to a chair (including a wheelchair)?: Total ?Help needed standing up from a chair using your arms (e.g., wheelchair or bedside chair)?: Total ?Help needed to walk in hospital room?: Total ?Help needed climbing 3-5 steps with a railing? : Total ?6 Click Score: 8 ? ?  ?End of Session   ?Activity Tolerance: Patient tolerated treatment well ?Patient left: in bed;with call bell/phone within reach;with bed alarm set ?Nurse Communication: Mobility status ?PT Visit Diagnosis: Other abnormalities of gait and mobility (R26.89);Muscle weakness (generalized) (M62.81);Other symptoms and signs involving the nervous system (R29.898);Hemiplegia and hemiparesis ?Hemiplegia - Right/Left: Right ?Hemiplegia - dominant/non-dominant: Dominant ?Hemiplegia - caused by: Nontraumatic intracerebral hemorrhage ?  ? ? ?Time: 8101-7510 ?PT Time Calculation (min) (ACUTE ONLY): 13 min ? ?Charges:  $Therapeutic Activity: 8-22 mins          ?          ? ?Aida Raider, PT  ?Office # [phone] ?Pager [phone] ? ? ? ?Ilda Foil ?09/21/2021, 1:07 PM ? ?

## 2021-09-21 NOTE — Progress Notes (Signed)
Modified Barium Swallow Progress Note ? ?Patient Details  ?Name: Joshua Price ?MRN: [id number] ?Date of Birth: 1961-02-27 ? ?Today's Date: 09/21/2021 ? ?Modified Barium Swallow completed.  Full report located under Chart Review in the Imaging Section. ? ?Brief recommendations include the following: ? ?Clinical Impression ? Pt was adequately awake for today's MBS with results similar to prior study 3/1 however laryngeal penetration was present today. Orally he has labial spill due to incomplete labial seal on right. Facilitation with therapist holding right side for labial adduction with cup and straw was beneficial fading to no assist as study progressed. Lingual residue and intermittent mild delayed transit. Therapist placing the cup/straw on his right side oral cavity was effective. He masticated a soft cereal bar with mildly decreased rotary pattern. Pharyngeal phase significant for swallow initiating at the valleculae and pyriform sinuses where it stayed for 3-8 seconds before swallow onset. Thin barium via straw penetrated to the vocal cords prior to laryngeal closure but was spontaneously ejected with immediate sensation followed by a hard reflexive cough. Vallecular residue was mostly mild, moderate on one occasion. There is a high risk for aspiration in pt with aphasia, decreased ability to orally control boluses, inadequate volitional cough or verbalizations and dependent feeding status. SLP will work with pt at bedside given his adequate sensation, for return to thins for potential upgrade when safe and appropriate. For now, recommend he continue nectar thick and Dys 1 (puree), crush pills, full supervision, place straw/cup on left side oral cavity and check for pocketing. ?  ?Swallow Evaluation Recommendations ? ?   ? ? SLP Diet Recommendations: Dysphagia 1 (Puree) solids;Nectar thick liquid;Other (Comment) (SLP to work with pt at bedside on thin) ? ? Liquid Administration via: Cup;Straw ? ? Medication  Administration: Crushed with puree ? ? Supervision: Full assist for feeding ? ? Compensations: Lingual sweep for clearance of pocketing;Small sips/bites;Slow rate;Minimize environmental distractions;Multiple dry swallows after each bite/sip ? ? Postural Changes: Seated upright at 90 degrees ? ? Oral Care Recommendations: Oral care BID ? ?   ? ? ? ?Royce Macadamia ?09/21/2021,3:21 PM ?

## 2021-09-21 NOTE — Progress Notes (Signed)
PROGRESS NOTE    Joshua Price  GYJ:[id number] DOB: Mar 13, 1961 DOA: 09/05/2021 PCP: Dartha Lodge, FNP    Brief Narrative:  61 y.o. male with history of HTN, smoking, esophageal varcies presenting after he was found down on the concrete porch of his home.  He has been on the phone speaking with a relative and she heard him fall and he was able to state that he was on the ground and could not get up family member stated that this happened at 1645 on 2/25 and the son was able to get there within 5 minutes, he then called EMS.  On arrival he had decreased alertness, right hemineglect, dysarthric speech.  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  He was admitted to the neuro ICU.  CT scans were repeated which showed stable findings.   Stay complicated by hypernatremia and aspiration pna.  Continues to have significant dysarthria.  Placement issue.           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  Cerebral edema was noted on imaging studies. -admitted to the neuro ICU.  Started on hypertonic saline.  CT head was repeated which showed stable findings with stable midline shift. -Echocardiogram shows normal systolic function with grade 1 diastolic dysfunction. -Neurology has signed off. -PT and OT is following.  Apparently not a good candidate for inpatient rehabilitation due to lack of family support.  Will eventually need to go to skilled nursing facility for rehab but currently no payer source -? Prognosis- patient still with functional deficits- discussed with mother on phone for palliative care consult for GOC as unclear how much if any he will recover- appreciate the on-going coversations with palliative care -paperwork left by mother (FMLA?) - filled out and on chart  Aspiration pneumonia [facility]) Patient developed fever on 3/3.  UA was  unremarkable and not suggestive of infection.  Chest x-ray showed left lower lung infiltrate.  Patient with significant oropharyngeal dysphagia.  Likely aspirated.  Started on Augmentin.   -s/p 5-day course. -may have aspirated again-- may need to resume if spikes temperature  Hypernatremia Likely due to combination of poor oral intake as well as the fact that he was on hypertonic saline.  Sodium level peaked at 166.   -encourage PO water intake -changed IVF to 1/2 NS (had some hypercalcemia and now hypernatremia)   Altered mental status Likely due to combination of stroke, hyponatremia, alcohol withdrawal.   -not sure how much patient will improve-- attempts to communicate but is unable to get words out and is frustrated.  Essential hypertension Presented with hypertensive emergency.  Currently on amlodipine and metoprolol.  Blood pressure has improved.  Continue current treatment.  Oropharyngeal dysphagia -tube feeds d/c'd 3/9 -DYS diet- has to be fed -aspiration precautions  Alcohol withdrawal (HCC) -s/p CIWA protocol -resolved Noted to be on thiamine multivitamins folic acid.  Malnutrition of moderate degree Nutrition Status: Nutrition Problem: Moderate Malnutrition Etiology: social / environmental circumstances (EtOH abuse) Signs/Symptoms: mild fat depletion, moderate fat depletion, mild muscle depletion, severe muscle depletion Interventions: Prostat, Tube feeding, MVI  -off tube feeding now   Hypokalemia Potassium has been repleted.  Magnesium 2.3. He is on daily potassium supplement-- may need to d/c  Hypercalcemia -gentle IVF for 24 hours -not on any supplements -improved with fluid  Hyperglycemia Add lantus and SSI coverage  -adjust as able    Calcium starting to  trend up-- recheck in AM-- may need IVF      DVT prophylaxis: heparin injection 5,000 Units Start: 09/12/21 2200 SCD's Start: 09/05/21 1942    Code Status: Full Code Family  Communication:   Disposition Plan:  Level of care: Progressive Status is: Inpatient Remains inpatient appropriate because: needs placement    Consultants:  Neurology Palliative care   Subjective: Not trying to communicate today but will nod head to yes/no questions   Objective: Vitals:   09/21/21 0316 09/21/21 0400 09/21/21 0500 09/21/21 0738  BP:    114/89  Pulse:    91  Resp:  20  15  Temp: 98.9 F (37.2 C)   98.6 F (37 C)  TempSrc: Axillary   Oral  SpO2:    95%  Weight:   72.6 kg   Height:        Intake/Output Summary (Last 24 hours) at 09/21/2021 1148 Last data filed at 09/21/2021 0400 Gross per 24 hour  Intake 1585.83 ml  Output 650 ml  Net 935.83 ml   Filed Weights   09/18/21 0300 09/19/21 0500 09/21/21 0500  Weight: 72.6 kg 72.6 kg 72.6 kg    Examination:   General: Appearance:     Overweight male in no acute distress     Lungs:     respirations unlabored  Heart:    Normal heart rate. Normal rhythm. No murmurs, rubs, or gallops.    MS:   All extremities are intact. Right hemiplegia   Neurologic:   Awake, alert           Data Reviewed: I have personally reviewed following labs and imaging studies  CBC: Recent Labs  Lab 09/16/21 0649 09/19/21 0340 09/20/21 0149 09/21/21 0823  WBC 6.5 7.7 8.4 8.2  HGB 11.9* 12.6* 12.4* 12.6*  HCT 35.6* 37.9* 37.3* 38.7*  MCV 100.0 98.4 100.0 101.6*  PLT 197 292 311 309   Basic Metabolic Panel: Recent Labs  Lab 09/17/21 0410 09/18/21 0431 09/18/21 1803 09/19/21 0340 09/20/21 0149 09/21/21 0823  NA 140 145  --  145 149* 151*  K 3.8 3.8  --  4.3 3.8 3.6  CL 106 109  --  110 116* 116*  CO2 23 25  --  25 24 26   GLUCOSE 329* 167* 474* 257* 135* 137*  BUN 18 23*  --  25* 21* 22*  CREATININE 1.00 1.03  --  1.01 0.92 1.06  CALCIUM 10.1 10.7*  --  10.8* 10.2 9.9   GFR: Estimated Creatinine Clearance: 66.9 mL/min (by C-G formula based on SCr of 1.06 mg/dL). Liver Function Tests: No results for  input(s): AST, ALT, ALKPHOS, BILITOT, PROT, ALBUMIN in the last 168 hours. No results for input(s): LIPASE, AMYLASE in the last 168 hours. No results for input(s): AMMONIA in the last 168 hours. Coagulation Profile: No results for input(s): INR, PROTIME in the last 168 hours. Cardiac Enzymes: No results for input(s): CKTOTAL, CKMB, CKMBINDEX, TROPONINI in the last 168 hours. BNP (last 3 results) No results for input(s): PROBNP in the last 8760 hours. HbA1C: No results for input(s): HGBA1C in the last 72 hours. CBG: Recent Labs  Lab 09/20/21 0740 09/20/21 1140 09/20/21 1557 09/20/21 2059 09/21/21 0808  GLUCAP 130* 206* 123* 207* 125*   Lipid Profile: No results for input(s): CHOL, HDL, LDLCALC, TRIG, CHOLHDL, LDLDIRECT in the last 72 hours. Thyroid Function Tests: No results for input(s): TSH, T4TOTAL, FREET4, T3FREE, THYROIDAB in the last 72 hours. Anemia Panel: No results for input(s):  VITAMINB12, FOLATE, FERRITIN, TIBC, IRON, RETICCTPCT in the last 72 hours. Sepsis Labs: No results for input(s): PROCALCITON, LATICACIDVEN in the last 168 hours.  No results found for this or any previous visit (from the past 240 hour(s)).       Radiology Studies: No results found.      Scheduled Meds:  amLODipine  10 mg Oral Daily   vitamin C  500 mg Oral BID   chlorhexidine  15 mL Mouth Rinse BID   Chlorhexidine Gluconate Cloth  6 each Topical Daily   cholecalciferol  1,000 Units Oral Daily   folic acid  1 mg Oral Daily   heparin injection (subcutaneous)  5,000 Units Subcutaneous Q8H   insulin aspart  0-15 Units Subcutaneous TID WC   insulin aspart  0-5 Units Subcutaneous QHS   insulin glargine-yfgn  14 Units Subcutaneous Daily   mouth rinse  15 mL Mouth Rinse q12n4p   metoprolol tartrate  25 mg Oral BID   multivitamin with minerals  1 tablet Oral Daily   pantoprazole  40 mg Oral QHS   [START ON 09/22/2021] potassium chloride  40 mEq Oral Daily   Continuous Infusions:   sodium chloride 125 mL/hr at 09/21/21 0918     LOS: 16 days    Time spent: 45 minutes spent on chart review, discussion with nursing staff, consultants, updating family and interview/physical exam; more than 50% of that time was spent in counseling and/or coordination of care.    Joseph Art, DO [facility] Available via Epic secure chat 7am-7pm After these hours, please refer to coverage provider listed on [email or web address] 09/21/2021, 11:48 AM

## 2021-09-22 LAB — BASIC METABOLIC PANEL
Anion gap: 8 (ref 5–15)
BUN: 20 mg/dL (ref 6–20)
CO2: 23 mmol/L (ref 22–32)
Calcium: 9.1 mg/dL (ref 8.9–10.3)
Chloride: 115 mmol/L — ABNORMAL HIGH (ref 98–111)
Creatinine, Ser: 0.91 mg/dL (ref 0.61–1.24)
GFR, Estimated: >60 mL/min (ref >60)
Glucose, Bld: 112 mg/dL — ABNORMAL HIGH (ref 70–99)
Potassium: 3.6 mmol/L (ref 3.5–5.1)
Sodium: 146 mmol/L — ABNORMAL HIGH (ref 135–145)

## 2021-09-22 LAB — CBC
HCT: 35.4 % — ABNORMAL LOW (ref 39.0–52.0)
Hemoglobin: 11.7 g/dL — ABNORMAL LOW (ref 13.0–17.0)
MCH: 32.7 pg (ref 26.0–34.0)
MCHC: 33.1 g/dL (ref 30.0–36.0)
MCV: 98.9 fL (ref 80.0–100.0)
Platelets: 231 10*3/uL (ref 150–400)
RBC: 3.58 MIL/uL — ABNORMAL LOW (ref 4.22–5.81)
RDW: 12.8 % (ref 11.5–15.5)
WBC: 7.4 10*3/uL (ref 4.0–10.5)
nRBC: 0 % (ref 0.0–0.2)

## 2021-09-22 LAB — GLUCOSE, CAPILLARY
Glucose-Capillary: 116 mg/dL — ABNORMAL HIGH (ref 70–99)
Glucose-Capillary: 148 mg/dL — ABNORMAL HIGH (ref 70–99)
Glucose-Capillary: 120 mg/dL — ABNORMAL HIGH (ref 70–99)
Glucose-Capillary: 106 mg/dL — ABNORMAL HIGH (ref 70–99)

## 2021-09-22 NOTE — TOC Progression Note (Signed)
Transition of Care (TOC) - Progression Note  ? ? ?Patient Details  ?Name: Joshua Price ?MRN: [id number] ?Date of Birth: Nov 02, 1960 ? ?Transition of Care (TOC) CM/SW Contact  ?Vinie Sill, LCSW ?Phone Number: ?09/22/2021, 5:35 PM ? ?Clinical Narrative:    ? ? ?TOC continues to follow  ?  ?  ? ?Expected Discharge Plan and Services ?  ?  ?  ?  ?  ?                ?  ?  ?  ?  ?  ?  ?  ?  ?  ?  ? ? ?Social Determinants of Health (SDOH) Interventions ?  ? ?Readmission Risk Interventions ?No flowsheet data found. ? ?

## 2021-09-22 NOTE — Progress Notes (Signed)
Speech Language Pathology Treatment: Dysphagia;Cognitive-Linquistic  ?Patient Details ?Name: Joshua Price ?MRN: [id number] ?DOB: 1961-05-22 ?Today's Date: 09/22/2021 ?Time: [phone] ?SLP Time Calculation (min) (ACUTE ONLY): 19 min ? ?Assessment / Plan / Recommendation ?Clinical Impression ? Pt just finished breakfast when therapist arrived. RN tech reported one cough with grits throughout meal. Trial of thin water via straw resulted in 1 immediate cough of 2 sips due to large sip. He has difficulty controlling sip size given oral impairments, therefore recommend continue with nectar thick liquids for now. No oral residue present after meal.  ? ?He was more alert today and more verbal in phrases and dysarthria and aphasia affect his intelligibility due to lower intensity and paraphasia's and neologisms. Approximately one decipherable word in short phrases due to aphasia. During functional activities he needs tactile cues to fully comprehend instructions such as "take another sip." ?He was unable to identify one of 4 pictures on communication board. Use of tactile and visual cues to count 1-10 with pt successful on "8, 9, 10" on 2 trials. Sang alphabet song without attempt from pt. Progressing towards goals in pt with significant aphasia.   ?  ?HPI HPI: Patient is a 61 y.o. male with PMH: HTN, tobacco use, esophageal varices who presented to ED via EMS on 2/25 as a code stroke after being found down and unable to get up on concrete porch of his home. EMS noted right sided weakness and incoherent speech. In ED, stat CT revealed acute left basal ganglia hemorrhage. on 2/25, MRI head revealed appearance of increasing size of left basal ganglia intraparenchymal hematoma and slightly increasing mass effect on left lateral ventricle and 69mm of left to right midline shift. MD concerned that pt is not getting enough free water and sodium going up and wanted to repeat MBS. RN stated he drinks the nectar thick adequately. ?  ?    ?SLP Plan ? Continue with current plan of care ? ?  ?  ?Recommendations for follow up therapy are one component of a multi-disciplinary discharge planning process, led by the attending physician.  Recommendations may be updated based on patient status, additional functional criteria and insurance authorization. ?  ? ?Recommendations  ?Diet recommendations: Nectar-thick liquid;Dysphagia 1 (puree) ?Liquids provided via: Straw;Cup ?Medication Administration: Crushed with puree ?Supervision: Staff to assist with self feeding;Full supervision/cueing for compensatory strategies ?Compensations: Slow rate;Small sips/bites;Lingual sweep for clearance of pocketing ?Postural Changes and/or Swallow Maneuvers: Seated upright 90 degrees  ?   ?    ?   ? ? ? ? Oral Care Recommendations: Oral care BID ?Follow Up Recommendations: Skilled nursing-short term rehab (<3 hours/day) ?Assistance recommended at discharge: Frequent or constant Supervision/Assistance ?SLP Visit Diagnosis: Dysphagia, oropharyngeal phase (R13.12);Aphasia (R47.01) ?Plan: Continue with current plan of care ? ? ? ? ?  ?  ? ? ?Houston Siren ? ?09/22/2021, 9:54 AM ?

## 2021-09-22 NOTE — Progress Notes (Signed)
?PROGRESS NOTE ? ? ? ?Torri Geng  URK:[id number] DOB: 03/28/1961 DOA: 09/05/2021 ?PCP: Dartha Lodge, FNP  ? ? ?Brief Narrative:  ?61 y.o. male with history of HTN, smoking, esophageal varcies presenting after he was found down on the concrete porch of his home.  He has been on the phone speaking with a relative and she heard him fall and he was able to state that he was on the ground and could not get up family member stated that this happened at 1645 on 2/25 and the son was able to get there within 5 minutes, he then called EMS.  On arrival he had decreased alertness, right hemineglect, dysarthric speech.  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  He was admitted to the neuro ICU.  CT scans were repeated which showed stable findings.   Stay complicated by hypernatremia and aspiration pna.  Continues to have significant dysarthria.  Placement issue.   ?  ? ?   ? ? 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  Cerebral edema was noted on imaging studies. ?-initially admitted to the neuro ICU.  Started on hypertonic saline.  CT head was repeated which showed stable findings with stable midline shift. ?-Echocardiogram shows normal systolic function with grade 1 diastolic dysfunction. ?-Neurology has signed off. ?-PT and OT is following.  Apparently not a good candidate for inpatient rehabilitation due to lack of family support.  Will eventually need to go to skilled nursing facility for rehab but currently no payer source ?-? Prognosis- patient still with functional deficits- discussed with mother on phone for palliative care consult for GOC as unclear how much if any he will recover- appreciate the on-going coversations with palliative care ?-paperwork left by mother (FMLA?) - filled out and on chart ? ?Aspiration pneumonia (HCC) ?Patient developed fever on 3/3.  UA was  unremarkable and not suggestive of infection.  Chest x-ray showed left lower lung infiltrate.  Patient with significant oropharyngeal dysphagia.  Likely aspirated.  Started on Augmentin.   ?-s/p 5-day course. ?-may have aspirated again-- may need to retreat if spikes temperature ? ?Hypernatremia ?Likely due to combination of poor oral intake as well as the fact that he was on hypertonic saline.  Sodium level peaked at 166.   ?-encourage PO water intake ?-changed IVF to 1/2 NS (had some hypercalcemia and now hypernatremia) ? ? ?Altered mental status ?Likely due to combination of stroke/ alcohol withdrawal.   ?-not sure how much patient will improve ?-with improvement in Na his mental status seems slightly better ? ?Essential hypertension ?Presented with hypertensive emergency.  Currently on amlodipine and metoprolol.  Blood pressure has improved.  Continue current treatment. ? ?Oropharyngeal dysphagia ?-tube feeds d/c'd 3/9 ?-DYS diet- has to be fed ?-aspiration precautions ? ?Alcohol withdrawal (HCC) ?-s/p CIWA protocol ?-resolved ?Noted to be on thiamine multivitamins folic acid. ? ?Malnutrition of moderate degree ?Nutrition Status: ?Nutrition Problem: Moderate Malnutrition ?Etiology: social / environmental circumstances (EtOH abuse) ?Signs/Symptoms: mild fat depletion, moderate fat depletion, mild muscle depletion, severe muscle depletion ?Interventions: Prostat, Tube feeding, MVI ? ?-off tube feeding now ? ? ?Hypokalemia ?Potassium has been repleted.  Magnesium 2.3. ?He is on daily potassium supplement- monitor ? ?Hypercalcemia ?-gentle IVF ?-not on any supplements ?-improved with fluid ? ?Hyperglycemia ?Add lantus and SSI coverage  ?-adjust as able ? ? ? ? ? ? ? ? ?DVT prophylaxis: heparin injection 5,000 Units Start:  09/12/21 2200 ?SCD's Start: 09/05/21 1942 ? ?  Code Status: Full Code ?Family Communication:  ? ?Disposition Plan:  ?Level of care: Progressive ?Status is: Inpatient ?Remains inpatient appropriate  because: needs placement ?  ? ?Consultants:  ?Neurology ?Palliative care ? ? ? ?Subjective: ?Eating breakfast ?More interactive ? ?Objective: ?Vitals:  ? 09/22/21 0000 09/22/21 0438 09/22/21 0500 09/22/21 0800  ?BP: 108/70 129/88  109/77  ?Pulse:  81  95  ?Resp:  (!) 21  19  ?Temp: 98.9 ?F (37.2 ?C) 98.6 ?F (37 ?C)  98.5 ?F (36.9 ?C)  ?TempSrc: Axillary Axillary  Oral  ?SpO2:  98%  99%  ?Weight:   72.6 kg   ?Height:      ? ? ?Intake/Output Summary (Last 24 hours) at 09/22/2021 0924 ?Last data filed at 09/22/2021 0439 ?Gross per 24 hour  ?Intake 358 ml  ?Output 1100 ml  ?Net -742 ml  ? ?Filed Weights  ? 09/19/21 0500 09/21/21 0500 09/22/21 0500  ?Weight: 72.6 kg 72.6 kg 72.6 kg  ? ? ?Examination: ? ? ?General: Appearance:     ?Overweight male in no acute distress  ?   ?Lungs:     respirations unlabored  ?Heart:    Normal heart rate.  ?  ?MS:   All extremities are intact.  ?  ?Neurologic:   Awake, alert, right hemiplegia  ?  ? ? ? ?Data Reviewed: I have personally reviewed following labs and imaging studies ? ?CBC: ?Recent Labs  ?Lab 09/16/21 ?0649 09/19/21 ?[id number] 09/20/21 ?0149 09/21/21 ?[id number] 09/22/21 ?0440  ?WBC 6.5 7.7 8.4 8.2 7.4  ?HGB 11.9* 12.6* 12.4* 12.6* 11.7*  ?HCT 35.6* 37.9* 37.3* 38.7* 35.4*  ?MCV 100.0 98.4 100.0 101.6* 98.9  ?PLT 197 292 311 309 231  ? ?Basic Metabolic Panel: ?Recent Labs  ?Lab 09/18/21 ?0431 09/18/21 ?1803 09/19/21 ?[id number] 09/20/21 ?0149 09/21/21 ?[id number] 09/22/21 ?0440  ?NA 145  --  145 149* 151* 146*  ?K 3.8  --  4.3 3.8 3.6 3.6  ?CL 109  --  110 116* 116* 115*  ?CO2 25  --  25 24 26 23   ?GLUCOSE 167* 474* 257* 135* 137* 112*  ?BUN 23*  --  25* 21* 22* 20  ?CREATININE 1.03  --  1.01 0.92 1.06 0.91  ?CALCIUM 10.7*  --  10.8* 10.2 9.9 9.1  ? ?GFR: ?Estimated Creatinine Clearance: 77.9 mL/min (by C-G formula based on SCr of 0.91 mg/dL). ?Liver Function Tests: ?No results for input(s): AST, ALT, ALKPHOS, BILITOT, PROT, ALBUMIN in the last 168 hours. ?No results for input(s): LIPASE, AMYLASE in  the last 168 hours. ?No results for input(s): AMMONIA in the last 168 hours. ?Coagulation Profile: ?No results for input(s): INR, PROTIME in the last 168 hours. ?Cardiac Enzymes: ?No results for input(s): CKTOTAL, CKMB, CKMBINDEX, TROPONINI in the last 168 hours. ?BNP (last 3 results) ?No results for input(s): PROBNP in the last 8760 hours. ?HbA1C: ?No results for input(s): HGBA1C in the last 72 hours. ?CBG: ?Recent Labs  ?Lab 09/21/21 ?0808 09/21/21 ?1226 09/21/21 ?1641 09/21/21 ?2155 09/22/21 ?0859  ?GLUCAP 125* 233* 144* 165* 116*  ? ?Lipid Profile: ?No results for input(s): CHOL, HDL, LDLCALC, TRIG, CHOLHDL, LDLDIRECT in the last 72 hours. ?Thyroid Function Tests: ?No results for input(s): TSH, T4TOTAL, FREET4, T3FREE, THYROIDAB in the last 72 hours. ?Anemia Panel: ?No results for input(s): VITAMINB12, FOLATE, FERRITIN, TIBC, IRON, RETICCTPCT in the last 72 hours. ?Sepsis Labs: ?No results for input(s): PROCALCITON, LATICACIDVEN in the last 168 hours. ? ?No results  found for this or any previous visit (from the past 240 hour(s)).  ? ? ? ? ? ?Radiology Studies: ?DG Swallowing Func-Speech Pathology ? ?Result Date: 09/21/2021 ?Table formatting from the original result was not included. Carterm5 Objective Swallowing Evaluation: Type of Study: MBS-Modified Barium Swallow Study  Patient Details Name: Chesley Veasey MRN: [id number] Date of Birth: 11/29/1960 Today's Date: 09/21/2021 Time: SLP Start Time (ACUTE ONLY): 1336 -SLP Stop Time (ACUTE ONLY): 1356 SLP Time Calculation (min) (ACUTE ONLY): 20 min Past Medical History: Past Medical History: Diagnosis Date  Esophageal varices (HCC)  Past Surgical History: No past surgical history on file. HPI: Patient is a 61 y.o. male with PMH: HTN, tobacco use, esophageal varices who presented to ED via EMS on 2/25 as a code stroke after being found down and unable to get up on concrete porch of his home. EMS noted right sided weakness and incoherent speech. In ED, stat CT revealed  acute left basal ganglia hemorrhage. on 2/25, MRI head revealed appearance of increasing size of left basal ganglia intraparenchymal hematoma and slightly increasing mass effect on left lateral ventricle

## 2021-09-22 NOTE — Progress Notes (Signed)
Occupational Therapy Treatment ?Patient Details ?Name: Joshua Price ?MRN: [id number] ?DOB: 1960-11-03 ?Today's Date: 09/22/2021 ? ? ?History of present illness 61 yo male presents to [facility] on 2/25 with fall at home, + ETOH, BP 218/134. CT head reveals acute parenchymal hemorrhage involving left lentiform nucleus and  surrounding white matter; repeat CT shows interval increase in side of intraparenchymal hematoma centered in L BG, increased edema and 13mm L-to-R midline shift. PMH includes HTN, smoker, esophageal varices. ?  ?OT comments ? Pt demonstrating increased attention to R side today, tracking therapist to R approx 25% of the time. While performing oral care bed level with HOB elevated with suction with L hand, able to get Right side of mouth with tactile cues. Pt also used left hand to grab right hand at wrist and able to hold on during one "round:" of PROM HEP at shoulder, elbow, wrist. Then therapist performed addition PROM for shoulder, elbow, wrist, and digits again. Pt did seem to enjoy having music on in background. Appreciate and acknowledge SLP treatment sessions for communication deficits as impaired communication continues to make identifying pain, motivation, and likes/dislikes hard to identify. OT will continue to follow acutely. SNF remains essential to maximize safety and independence in ADL and functional transfers.   ? ?Recommendations for follow up therapy are one component of a multi-disciplinary discharge planning process, led by the attending physician.  Recommendations may be updated based on patient status, additional functional criteria and insurance authorization. ?   ?Follow Up Recommendations ? Skilled nursing-short term rehab (<3 hours/day)  ?  ?Assistance Recommended at Discharge Frequent or constant Supervision/Assistance  ?Patient can return home with the following ? Two people to help with walking and/or transfers;Two people to help with bathing/dressing/bathroom;Direct  supervision/assist for medications management;Direct supervision/assist for financial management;Assistance with cooking/housework;Assist for transportation;Help with stairs or ramp for entrance;Assistance with feeding ?  ?Equipment Recommendations ? Other (comment) (defer to next venue of care)  ?  ?Recommendations for Other Services PT consult;Speech consult ? ?  ?Precautions / Restrictions Precautions ?Precautions: Fall ?Restrictions ?Weight Bearing Restrictions: No  ? ? ?  ? ?Mobility Bed Mobility ?Overal bed mobility: Needs Assistance ?Bed Mobility: Rolling ?Rolling: Mod assist ?  ?  ?  ?  ?  ?  ? ?Transfers ?  ?  ?  ?  ?  ?  ?  ?  ?  ?General transfer comment: Nt this session ?  ?  ?Balance   ?  ?  ?  ?  ?  ?  ?  ?  ?  ?  ?  ?  ?  ?  ?  ?  ?  ?  ?   ? ?ADL either performed or assessed with clinical judgement  ? ?ADL   ?  ?  ?Grooming: Oral care;Moderate assistance;Cueing for sequencing;Bed level;Wash/dry face (HOB elevated) ?Grooming Details (indicate cue type and reason): utilizing oral care via suction for safety - very motivating and satifying for Pt ?  ?  ?  ?  ?  ?  ?  ?  ?  ?  ?  ?  ?  ?  ?  ?  ?  ? ?Extremity/Trunk Assessment Upper Extremity Assessment ?Upper Extremity Assessment: RUE deficits/detail ?RUE Deficits / Details: flaccid--PROM WNL without tightness noted in digits today. ?RUE Sensation: decreased light touch;decreased proprioception (maybe impacted by speech, but does not respond to touch on RUE) ?RUE Coordination: decreased fine motor;decreased gross motor ?  ?  ?  ?  ?  ? ?  Vision   ?Additional Comments: Pt tracking therapist to R today from bed level ?  ?Perception   ?  ?Praxis   ?  ? ?Cognition Arousal/Alertness: Awake/alert ?Behavior During Therapy: Restless ?Overall Cognitive Status: Impaired/Different from baseline ?Area of Impairment: Following commands, Awareness, Problem solving, Attention, Safety/judgement ?  ?  ?  ?  ?  ?  ?  ?  ?  ?Current Attention Level: Sustained ?  ?Following  Commands: Follows one step commands inconsistently, Follows one step commands with increased time ?Safety/Judgement: Decreased awareness of safety, Decreased awareness of deficits ?Awareness: Intellectual ?Problem Solving: Decreased initiation, Difficulty sequencing, Requires verbal cues, Requires tactile cues ?General Comments: Difficult to understand. Garbled speech, difficult to discern expressive vs receptive deficits. Following simple commands 25% of time ?  ?  ?   ?Exercises Exercises: Other exercises ?Other Exercises ?Other Exercises: PROM R shoulder flexion/extension, abduction. Elbow flex/ext. digits full extension. ?Other Exercises: LUE holding onto R wrist and assiting with PROM of R shoulder flexion/extension, abduction. Elbow flex/ext. ? ?  ?Shoulder Instructions   ? ? ?  ?General Comments Pt enjoyed music during therapy today  ? ? ?Pertinent Vitals/ Pain       Pain Assessment ?Pain Assessment: Faces ?Pain Intervention(s): Monitored during session ? ?Home Living   ?  ?  ?  ?  ?  ?  ?  ?  ?  ?  ?  ?  ?  ?  ?  ?  ?  ?  ? ?  ?Prior Functioning/Environment    ?  ?  ?  ?   ? ?Frequency ? Min 2X/week  ? ? ? ? ?  ?Progress Toward Goals ? ?OT Goals(current goals can now be found in the care plan section) ? Progress towards OT goals: Progressing toward goals ? ?Acute Rehab OT Goals ?Patient Stated Goal: none stated ?OT Goal Formulation: Patient unable to participate in goal setting ?Time For Goal Achievement: 10/06/21 (goals updated today) ?Potential to Achieve Goals: Good ?ADL Goals ?Pt Will Perform Grooming: with supervision;sitting ?Pt Will Perform Upper Body Bathing: with min assist;sitting ?Pt Will Transfer to Toilet: with mod assist;squat pivot transfer;bedside commode ?Pt/caregiver will Perform Home Exercise Program: Increased ROM;Increased strength;Right Upper extremity;With Supervision;With written HEP provided ?Additional ADL Goal #1: Pt will follow 50% of one step commands ?Additional ADL Goal #2: Pt  will be able to roll left and right with min A with at bed level to facilitate basic ADL and in preparation for sitting EOB ?Additional ADL Goal #3: Pt will track to R visual fields 50% of the time for finding ADL objects on R side of tray/surface  ?Plan Discharge plan remains appropriate;Frequency remains appropriate   ? ?Co-evaluation ? ? ?   ?  ?  ?  ?  ? ?  ?AM-PAC OT "6 Clicks" Daily Activity     ?Outcome Measure ? ? Help from another person eating meals?: Total (NPO) ?Help from another person taking care of personal grooming?: A Lot ?Help from another person toileting, which includes using toliet, bedpan, or urinal?: Total ?Help from another person bathing (including washing, rinsing, drying)?: A Lot ?Help from another person to put on and taking off regular upper body clothing?: Total ?Help from another person to put on and taking off regular lower body clothing?: Total ?6 Click Score: 8 ? ?  ?End of Session   ? ?OT Visit Diagnosis: Unsteadiness on feet (R26.81);Other abnormalities of gait and mobility (R26.89);Muscle weakness (generalized) (M62.81);Low vision, both eyes (  H54.2);Other symptoms and signs involving cognitive function;Hemiplegia and hemiparesis;Cognitive communication deficit (R41.841) ?Symptoms and signs involving cognitive functions: Nontraumatic intracerebral hemorrhage ?Hemiplegia - Right/Left: Right ?Hemiplegia - caused by: Nontraumatic intracerebral hemorrhage ?  ?Activity Tolerance Patient tolerated treatment well ?  ?Patient Left in bed;with call bell/phone within reach;with bed alarm set ?  ?Nurse Communication Mobility status ?  ? ?   ? ?Time: [id number] ?OT Time Calculation (min): 19 min ? ?Charges: OT General Charges ?$OT Visit: 1 Visit ?OT Treatments ?$Self Care/Home Management : 8-22 mins ? ?Jesse Sans OTR/L ?Acute Rehabilitation Services ?Pager: [phone] ?Office: [phone] ? ? ?Joshua Price ?09/22/2021, 1:39 PM ?

## 2021-09-23 DIAGNOSIS — R4182 Altered mental status, unspecified: Secondary | ICD-10-CM

## 2021-09-23 DIAGNOSIS — R451 Restlessness and agitation: Secondary | ICD-10-CM

## 2021-09-23 DIAGNOSIS — R531 Weakness: Secondary | ICD-10-CM

## 2021-09-23 LAB — CBC
HCT: 32.9 % — ABNORMAL LOW (ref 39.0–52.0)
Hemoglobin: 10.6 g/dL — ABNORMAL LOW (ref 13.0–17.0)
MCH: 31.4 pg (ref 26.0–34.0)
MCHC: 32.2 g/dL (ref 30.0–36.0)
MCV: 97.3 fL (ref 80.0–100.0)
Platelets: 268 10*3/uL (ref 150–400)
RBC: 3.38 MIL/uL — ABNORMAL LOW (ref 4.22–5.81)
RDW: 12.6 % (ref 11.5–15.5)
WBC: 7 10*3/uL (ref 4.0–10.5)
nRBC: 0 % (ref 0.0–0.2)

## 2021-09-23 LAB — BASIC METABOLIC PANEL
Anion gap: 12 (ref 5–15)
BUN: 15 mg/dL (ref 6–20)
CO2: 21 mmol/L — ABNORMAL LOW (ref 22–32)
Calcium: 8.6 mg/dL — ABNORMAL LOW (ref 8.9–10.3)
Chloride: 107 mmol/L (ref 98–111)
Creatinine, Ser: 0.81 mg/dL (ref 0.61–1.24)
GFR, Estimated: >60 mL/min (ref >60)
Glucose, Bld: 103 mg/dL — ABNORMAL HIGH (ref 70–99)
Potassium: 3.5 mmol/L (ref 3.5–5.1)
Sodium: 140 mmol/L (ref 135–145)

## 2021-09-23 LAB — GLUCOSE, CAPILLARY
Glucose-Capillary: 83 mg/dL (ref 70–99)
Glucose-Capillary: 112 mg/dL — ABNORMAL HIGH (ref 70–99)
Glucose-Capillary: 174 mg/dL — ABNORMAL HIGH (ref 70–99)
Glucose-Capillary: 112 mg/dL — ABNORMAL HIGH (ref 70–99)

## 2021-09-23 MED ORDER — NYSTATIN 100000 UNIT/ML MT SUSP
5.0000 mL | Freq: Four times a day (QID) | OROMUCOSAL | Status: DC
  Administered 2021-09-23 – 2021-10-01 (×28): 500000 [IU] via ORAL
  Filled 2021-09-23 (×37): qty 5

## 2021-09-23 MED ORDER — NYSTATIN 100000 UNIT/ML MT SUSP
5.0000 mL | Freq: Four times a day (QID) | OROMUCOSAL | Status: DC
  Filled 2021-09-23: qty 5

## 2021-09-23 NOTE — Progress Notes (Signed)
?PROGRESS NOTE ? ?Joshua Price  [id number] DOB: 23-Jul-1960 DOA: 09/05/2021 ?PCP: Dartha Lodge, FNP  ? ?Brief Narrative: ?61 y.o. male with history of HTN, smoking, esophageal varcies presenting after he was found down on the concrete porch of his home.  He has been on the phone speaking with a relative and she heard him fall and he was able to state that he was on the ground and could not get up family member stated that this happened at 1645 on 2/25 and the son was able to get there within 5 minutes, he then called EMS.  On arrival he had decreased alertness, right hemineglect, dysarthric speech.  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  He was admitted to the neuro ICU.  CT scans were repeated which showed stable findings.   Stay complicated by hypernatremia and aspiration pna.  Continues to have significant dysarthria.  Placement issue.  Plan for skilled nursing facility placement.  TOC following ?  ? ?   ? ?Assessment & Plan: ? ?Principal Problem: ?  ICH (intracerebral hemorrhage) (HCC) ?Active Problems: ?  Hypernatremia ?  Aspiration pneumonia (HCC) ?  Essential hypertension ?  Altered mental status ?  Alcohol withdrawal (HCC) ?  Oropharyngeal dysphagia ?  Malnutrition of moderate degree ?  Hypokalemia ?  Hyperglycemia ? ? 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  Cerebral edema was noted on imaging studies. ?-initially admitted to the neuro ICU.  Started on hypertonic saline.  CT head was repeated which showed stable findings with stable midline shift. ?-Has right hemiplegia, severe dysarthria ?-Echocardiogram shows normal systolic function with grade 1 diastolic dysfunction. ?-Neurology has signed off. ?-PT and OT is following.  Apparently not a good candidate for inpatient rehabilitation due to lack of family support.  Will eventually need to go to  skilled nursing facility for rehab but currently no payer source ?- Prognosis is poor- patient still with functional deficits.  Goals of care was discussed with family by palliative care.  Remains full code, continue full scope of treatment for now. ? ?Aspiration pneumonia (HCC) ?Patient developed fever on 3/3.  UA was unremarkable and not suggestive of infection.  Chest x-ray showed left lower lung infiltrate.  Patient with significant oropharyngeal dysphagia.  Likely aspirated.  Started on Augmentin.   ?-s/p 5-day course. ?-may have aspirated again-- may need to retreat if spikes temperature ? ?Hypernatremia ?resolved ? ? ?Altered mental status ?-sequelae of ICH   ?-not sure how much patient will improve ? ? ?Essential hypertension ?Presented with hypertensive emergency.  Currently on amlodipine and metoprolol.  Blood pressure has improved.  Continue current treatment. ? ?Oropharyngeal dysphagia ?-tube feeds d/c'd 3/9 ?-DYS diet- has to be fed ?-aspiration precautions ? ?Alcohol withdrawal (HCC) ?-s/p CIWA protocol ?-resolved ?Noted to be on thiamine multivitamins folic acid. ? ?Malnutrition of moderate degree ?Nutrition Status: ?Nutrition Problem: Moderate Malnutrition ?Etiology: social / environmental circumstances (EtOH abuse) ?Signs/Symptoms: mild fat depletion, moderate fat depletion, mild muscle depletion, severe muscle depletion ?Interventions: Prostat, Tube feeding, MVI ? ?-off tube feeding now ? ? ?Hypokalemia ?Continue daily supplementation, monitor levels ? ?Hyperglycemia ?Continue lantus and SSI coverage  ?-adjust as able ? ? ? ? ?  ? ? ?Nutrition Problem: Moderate Malnutrition ?Etiology: social / environmental circumstances (EtOH abuse) ?  ? ?DVT prophylaxis:heparin injection 5,000 Units Start: 09/12/21 2200 ?SCD's Start: 09/05/21 1942 ? ? ?  Code Status: Full Code ? ?  Family Communication: None at bedside ? ?Patient status:Inpatient ? ?Patient is from :Home ? ?Anticipated discharge to:  SNF ? ?Estimated DC date: Pending placement ? ? ?Consultants: Neurology, palliative care ? ?Procedures: None ? ?Antimicrobials:  ?Anti-infectives (From admission, onward)  ? ? Start     Dose/Rate Route Frequency Ordered Stop  ? 09/12/21 1215  amoxicillin-clavulanate (AUGMENTIN) 400-57 MG/5ML suspension 875 mg       ?Note to Pharmacy: Please adjust dose as indicated. For aspiration pna. thanks  ? 875 mg Per Tube Every 12 hours 09/12/21 1117 09/16/21 2100  ? ?  ? ? ?Subjective: ? ?Patient seen and examined at the bedside this morning.  Sitting in the chair.  Not in any kind of distress, hemodynamically stable.  Weak on the right side.  Tries to follow commands, dysarthric. ? ? ?Objective: ?Vitals:  ? 09/23/21 0315 09/23/21 0500 09/23/21 0754 09/23/21 0858  ?BP: (!) 143/96  (!) 136/6 133/82  ?Pulse: 83  84   ?Resp: (!) 22  20   ?Temp: 98.2 ?F (36.8 ?C)  98.3 ?F (36.8 ?C)   ?TempSrc: Oral  Oral   ?SpO2: 95%  97%   ?Weight:  72.7 kg    ?Height:      ? ? ?Intake/Output Summary (Last 24 hours) at 09/23/2021 1141 ?Last data filed at 09/23/2021 0900 ?Gross per 24 hour  ?Intake 600 ml  ?Output 2100 ml  ?Net -1500 ml  ? ?Filed Weights  ? 09/21/21 0500 09/22/21 0500 09/23/21 0500  ?Weight: 72.6 kg 72.6 kg 72.7 kg  ? ? ?Examination: ? ?General exam: Overall comfortable, not in distress, deconditioned ?HEENT: PERRL, oral thrush ?Respiratory system:  no wheezes or crackles  ?Cardiovascular system: S1 & S2 heard, RRR.  ?Gastrointestinal system: Abdomen is nondistended, soft and nontender. ?Central nervous system: Alert and awake, not sure whether he is oriented, right hemiaplasia, follows commands ?Extremities: No edema, no clubbing ,no cyanosis ?Skin: No rashes, no ulcers,no icterus   ? ? ?Data Reviewed: I have personally reviewed following labs and imaging studies ? ?CBC: ?Recent Labs  ?Lab 09/19/21 ?5456 09/20/21 ?0149 09/21/21 ?2563 09/22/21 ?8937 09/23/21 ?3428  ?WBC 7.7 8.4 8.2 7.4 7.0  ?HGB 12.6* 12.4* 12.6* 11.7* 10.6*  ?HCT  37.9* 37.3* 38.7* 35.4* 32.9*  ?MCV 98.4 100.0 101.6* 98.9 97.3  ?PLT 292 311 309 231 268  ? ?Basic Metabolic Panel: ?Recent Labs  ?Lab 09/19/21 ?7681 09/20/21 ?0149 09/21/21 ?1572 09/22/21 ?6203 09/23/21 ?5597  ?NA 145 149* 151* 146* 140  ?K 4.3 3.8 3.6 3.6 3.5  ?CL 110 116* 116* 115* 107  ?CO2 25 24 26 23  21*  ?GLUCOSE 257* 135* 137* 112* 103*  ?BUN 25* 21* 22* 20 15  ?CREATININE 1.01 0.92 1.06 0.91 0.81  ?CALCIUM 10.8* 10.2 9.9 9.1 8.6*  ? ? ? ?No results found for this or any previous visit (from the past 240 hour(s)).  ? ?Radiology Studies: ?DG Swallowing Func-Speech Pathology ? ?Result Date: 09/21/2021 ?Table formatting from the original result was not included. Carterm5 Objective Swallowing Evaluation: Type of Study: MBS-Modified Barium Swallow Study  Patient Details Name: Joshua Price MRN: Baker Pierini Date of Birth: [birth year] Today's Date: 09/21/2021 Time: SLP Start Time (ACUTE ONLY): 1336 -SLP Stop Time (ACUTE ONLY): 1356 SLP Time Calculation (min) (ACUTE ONLY): 20 min Past Medical History: Past Medical History: Diagnosis Date  Esophageal varices (HCC)  Past Surgical History: No past surgical history on file. HPI: Patient is a 61 y.o. male with PMH: HTN, tobacco use, esophageal varices who  presented to ED via EMS on 2/25 as a code stroke after being found down and unable to get up on concrete porch of his home. EMS noted right sided weakness and incoherent speech. In ED, stat CT revealed acute left basal ganglia hemorrhage. on 2/25, MRI head revealed appearance of increasing size of left basal ganglia intraparenchymal hematoma and slightly increasing mass effect on left lateral ventricle and 4mm of left to right midline shift. MD concerned that pt is not getting enough free water and sodium going up and wanted to repeat MBS. RN stated he drinks the nectar thick adequately.  Subjective: lethargic but able to maintain brief periods of alertness  Recommendations for follow up therapy are one component of a  multi-disciplinary discharge planning process, led by the attending physician.  Recommendations may be updated based on patient status, additional functional criteria and insurance authorization. Assessment / Plan / Recommenda

## 2021-09-23 NOTE — Progress Notes (Signed)
Nutrition Follow-up ? ?DOCUMENTATION CODES:  ? ?Non-severe (moderate) malnutrition in context of social or environmental circumstances ? ?INTERVENTION:  ? ?- Continue Vital Cuisine Shake TID, each supplement provides 520 kcal and 22 grams of protein ?  ?- Continue Mighty Shake TID, each supplement provides 330 kcal and 9 grams of protein ?  ?- Encourage PO intake and provide feeding assistance ?  ?- Continue MVI with minerals daily ? ?NUTRITION DIAGNOSIS:  ? ?Moderate Malnutrition related to social / environmental circumstances (EtOH abuse) as evidenced by mild fat depletion, moderate fat depletion, mild muscle depletion, severe muscle depletion. ? ?Ongoing, being addressed via oral nutrition supplements ? ?GOAL:  ? ?Patient will meet greater than or equal to 90% of their needs ? ?Progressing ? ?MONITOR:  ? ?PO intake, TF tolerance, Labs ? ?REASON FOR ASSESSMENT:  ? ?Other (new Cortrak) ?  ? ?ASSESSMENT:  ? ?Pt with a history of HTN, tobacco abuse (.25 pack/d), EtOH abuse, and esophageal varices presented to ED as a code stroke after being found down at home. In ED, pt in hypertensive crisis and imaging showing an acute left basal ganglia hemorrhage. ? ?03/01 - Cortrak placed (tip gastric) ?03/09 - Cortrak removed ? ?Attempted to speak with pt at bedside. Pt sleeping soundly and did not awaken to RD voice. ? ?Per OT note this morning, pt engaging in self-feeding. SLP continues to follow pt as well and notes pt finished breakfast meal tray on 3/14. SLP recommending pt continue with nectar-thick liquids for now. RD will continue to maximize PO intake by providing all nectar-thick supplement options available. ? ?Noted Palliative Medicine team is following pt for discussions regarding GOC. ? ?Admit weight: 69 kg ?Current weight: 72.7 kg ? ?Meal Completion: 15-100% x last 8 documented meals ? ?Medications reviewed and include: vitamin C 500 mg BID, cholecalciferol 1000 units daily, folic acid, SSI, semglee 14 units  daily, MVI with minerals, protonix, klor-con 40 mEq daily,  ?IVF: 1/2NS @ 125 ml/hr ? ?Vitamin/Mineral Profile: ?Thiamine B1: 87.4 (WNL) ?Vitamin B12: 1384 (H) ?Folate B9: 22.5 (WNL) ?Vitamin A: 24.0 (WNL) ?Vitamin D: 6.9 (L) ?Vitamin C: <0.1 (L) ?Zinc: 60 (WNL) ? ?Labs reviewed. CBG's: 83-148 x 24 hours ? ?UOP: 2000 ml x 24 hours ?I/O's: +9.4 L since admit ? ?Diet Order:   ?Diet Order   ? ?       ?  DIET - DYS 1 Room service appropriate? Yes; Fluid consistency: Nectar Thick  Diet effective now       ?  ? ?  ?  ? ?  ? ? ?EDUCATION NEEDS:  ? ?Not appropriate for education at this time ? ?Skin:  Skin Assessment: Reviewed RN Assessment (ecchymosis to the right shoulder) ? ?Last BM:  09/22/21 multiple type 3 ? ?Height:  ? ?Ht Readings from Last 1 Encounters:  ?09/05/21 5\' 6"  (1.676 m)  ? ? ?Weight:  ? ?Wt Readings from Last 1 Encounters:  ?09/23/21 72.7 kg  ? ? ?Ideal Body Weight:  64.5 kg ? ?BMI:  Body mass index is 25.87 kg/m?. ? ?Estimated Nutritional Needs:  ? ?Kcal:  2100-2300 kcal/day ? ?Protein:  105-120 g/day ? ?Fluid:  2.1-2.3 L/day ? ? ? ?09/25/21, MS, RD, LDN ?Inpatient Clinical Dietitian ?Please see AMiON for contact information. ? ?

## 2021-09-23 NOTE — Progress Notes (Signed)
Physical Therapy Treatment ?Patient Details ?Name: Joshua Price ?MRN: [id number] ?DOB: 01-20-1961 ?Today's Date: 09/23/2021 ? ? ?History of Present Illness 61 yo male presents to [facility] on 2/25 with fall at home, + ETOH, BP 218/134. CT head reveals acute parenchymal hemorrhage involving left lentiform nucleus and  surrounding white matter; repeat CT shows interval increase in side of intraparenchymal hematoma centered in L BG, increased edema and 57mm L-to-R midline shift. PMH includes HTN, smoker, esophageal varices. ? ?  ?PT Comments  ? ? Pt tolerates treatment well with encouragement of PT/OT. Pt continues to demonstrate R lateral lean but this improves in sitting as session progresses. Pt with no AROM of R side, increased flexor tone noted. Pt will benefit from continued aggressive mobilization in an effort to improve balance and reduce falls risk. PT continues to recommend SNF placement.   ?Recommendations for follow up therapy are one component of a multi-disciplinary discharge planning process, led by the attending physician.  Recommendations may be updated based on patient status, additional functional criteria and insurance authorization. ? ?Follow Up Recommendations ? Skilled nursing-short term rehab (<3 hours/day) ?  ?  ?Assistance Recommended at Discharge Frequent or constant Supervision/Assistance  ?Patient can return home with the following Two people to help with walking and/or transfers;Two people to help with bathing/dressing/bathroom;Direct supervision/assist for medications management;Direct supervision/assist for financial management;Assist for transportation;Help with stairs or ramp for entrance;Assistance with cooking/housework;Assistance with feeding ?  ?Equipment Recommendations ? Wheelchair (measurements PT);Wheelchair cushion (measurements PT);BSC/3in1;Other (comment)  ?  ?Recommendations for Other Services   ? ? ?  ?Precautions / Restrictions Precautions ?Precautions: Fall ?Precaution Comments:  Pt pushes to the R ?Restrictions ?Weight Bearing Restrictions: No  ?  ? ?Mobility ? Bed Mobility ?Overal bed mobility: Needs Assistance ?Bed Mobility: Supine to Sit, Sit to Supine ?  ?  ?Supine to sit: Mod assist, +2 for physical assistance ?  ?  ?General bed mobility comments: pt requires assist to mobilize RLE, pulls into sitting with PT hand hold, maxA to maintain sitting balance initially ?  ? ?Transfers ?Overall transfer level: Needs assistance ?Equipment used: 1 person hand held assist, 2 person hand held assist ?Transfers: Sit to/from Stand, Bed to chair/wheelchair/BSC ?Sit to Stand: Max assist, +2 physical assistance ?  ?  ?Squat pivot transfers: Max assist ?  ?  ?General transfer comment: pt with stronge right lateral lean intially, does demonstrate ability to correct some with LUE support and verbal cues although modA at best ?  ? ?Ambulation/Gait ?  ?  ?  ?  ?  ?  ?  ?  ? ? ?Stairs ?  ?  ?  ?  ?  ? ? ?Wheelchair Mobility ?  ? ?Modified Rankin (Stroke Patients Only) ?Modified Rankin (Stroke Patients Only) ?Pre-Morbid Rankin Score: No significant disability ?Modified Rankin: Severe disability ? ? ?  ?Balance Overall balance assessment: Needs assistance ?Sitting-balance support: Single extremity supported, No upper extremity supported ?Sitting balance-Leahy Scale: Poor ?Sitting balance - Comments: maxA initially, improves to minG at times. Intermittent pushing to right with LUE ?Postural control: Right lateral lean ?Standing balance support: Single extremity supported ?Standing balance-Leahy Scale: Poor ?Standing balance comment: maxA, R lateral lean ?  ?  ?  ?  ?  ?  ?  ?  ?  ?  ?  ?  ? ?  ?Cognition Arousal/Alertness: Awake/alert ?Behavior During Therapy: [facility] for tasks assessed/performed ?Overall Cognitive Status: Impaired/Different from baseline ?Area of Impairment: Problem solving ?  ?  ?  ?  ?  ?  ?  ?  ?  ?  ?  ?  ?  ?  ?  Problem Solving: Slow processing, Requires verbal cues, Requires tactile  cues ?General Comments: pt appears to required increased time to process, however difficult to assess due to expressive aphasia. Pt also appears to get frustrated but participates well this session ?  ?  ? ?  ?Exercises   ? ?  ?General Comments General comments (skin integrity, edema, etc.): VSS on RA ?  ?  ? ?Pertinent Vitals/Pain Pain Assessment ?Pain Assessment: No/denies pain  ? ? ?Home Living   ?  ?  ?  ?  ?  ?  ?  ?  ?  ?   ?  ?Prior Function    ?  ?  ?   ? ?PT Goals (current goals can now be found in the care plan section) Acute Rehab PT Goals ?Patient Stated Goal: unable to state ?Progress towards PT goals: Progressing toward goals ? ?  ?Frequency ? ? ? Min 3X/week ? ? ? ?  ?PT Plan Current plan remains appropriate  ? ? ?Co-evaluation PT/OT/SLP Co-Evaluation/Treatment: Yes ?Reason for Co-Treatment: Complexity of the patient's impairments (multi-system involvement);Necessary to address cognition/behavior during functional activity;For patient/therapist safety;To address functional/ADL transfers ?PT goals addressed during session: Balance;Strengthening/ROM;Mobility/safety with mobility ?  ?  ? ?  ?AM-PAC PT "6 Clicks" Mobility   ?Outcome Measure ? Help needed turning from your back to your side while in a flat bed without using bedrails?: A Lot ?Help needed moving from lying on your back to sitting on the side of a flat bed without using bedrails?: A Lot ?Help needed moving to and from a bed to a chair (including a wheelchair)?: Total ?Help needed standing up from a chair using your arms (e.g., wheelchair or bedside chair)?: A Lot ?Help needed to walk in hospital room?: Total ?Help needed climbing 3-5 steps with a railing? : Total ?6 Click Score: 9 ? ?  ?End of Session   ?Activity Tolerance: Patient tolerated treatment well ?Patient left: in chair;with call bell/phone within reach;with chair alarm set ?Nurse Communication: Mobility status ?PT Visit Diagnosis: Other abnormalities of gait and mobility  (R26.89);Muscle weakness (generalized) (M62.81);Other symptoms and signs involving the nervous system (R29.898);Hemiplegia and hemiparesis ?Hemiplegia - Right/Left: Right ?Hemiplegia - dominant/non-dominant: Dominant ?Hemiplegia - caused by: Nontraumatic intracerebral hemorrhage ?  ? ? ?Time: 7169-6789 ?PT Time Calculation (min) (ACUTE ONLY): 23 min ? ?Charges:  $Therapeutic Activity: 8-22 mins          ?          ? ?Arlyss Gandy, PT, DPT ?Acute Rehabilitation ?Pager: [phone] ?Office [phone] ? ? ? ?Arlyss Gandy ?09/23/2021, 8:54 AM ? ?

## 2021-09-23 NOTE — Progress Notes (Signed)
Patient ID: Joshua Price, male   DOB: 10/17/1960, 61 y.o.   MRN: [id number] ? ? ? ?Progress Note from the Palliative Medicine Team at [facility] ? ? ?Patient Name: Joshua Price        ?Date: 09/23/2021 ?DOB: [birth year]  Age: 61 y.o. MRN#: [id number] ?Attending Physician: Shelly Coss, MD ?Primary Care Physician: Elbert Ewings, FNP ?Admit Date: 09/05/2021 ? ? ?Medical records reviewed  ? ?61 y.o. male with history of HTN, smoking, esophageal varcies presenting after he was found down on the concrete porch of his home.  He has been on the phone speaking with a relative and she heard him fall and he was able to state that he was on the ground and could not get up family member stated that this happened at 1645 on 2/25 and the son was able to get there within 5 minutes, he then called EMS.  On arrival he had decreased alertness, right hemineglect, dysarthric speech.  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  He was admitted to the neuro ICU.  CT scans were repeated which showed stable findings.  Developed hypernatremia which has improved with free water.  Continues to have significant dysarthria as well as dysphagia.  ?  ?Long hospitalization, 2/2 to limited discharge options,   patient remains weak, requiring assistance with all activities of daily living.  Intermittently follows simply commands.  Without medical decsion capacity ? ?Initial consult with PMT 09-17-21 with mother ?  ?Family face ongoing treatment option decisions,, advanced directive decisions and anticipatory care needs. ? ?This NP visited patient at the bedside as a follow up for palliative medicine needs and emotional support.  I spoke to mother by phone, updated on current medical situation.   ? ?Again education offered on  his high risk to decompensate 2/2 to cognitive deficits, risk of aspiration and infection. ? ?She continues to hope for improvement and is open to all offered and available medical  interventions to prolong life. ? ?Discussed with mother the importance of continued conversation with the medical providers regarding overall plan of care and treatment options,  ensuring decisions are within the context of the patients values and GOCs.   ? ?Questions and concerns addressed   Discussed with Dr Tawanna Solo ? ?Total time spent on the unit was 55 minutes ? ?Greater than 50% of the time was spent in counseling and coordination of care ? ?Wadie Lessen NP  ?Palliative Medicine Team Team Phone # [phone] ?Pager [phone] ?  ?

## 2021-09-23 NOTE — Progress Notes (Signed)
Occupational Therapy Treatment ?Patient Details ?Name: Joshua Price ?MRN: [id number] ?DOB: 1960/08/29 ?Today's Date: 09/23/2021 ? ? ?History of present illness 61 yo male presents to [facility] on 2/25 with fall at home, + ETOH, BP 218/134. CT head reveals acute parenchymal hemorrhage involving left lentiform nucleus and  surrounding white matter; repeat CT shows interval increase in side of intraparenchymal hematoma centered in L BG, increased edema and 38mm L-to-R midline shift. PMH includes HTN, smoker, esophageal varices. ?  ?OT comments ? Pt making excellent progress towards OT goals this session. Pt continues with improving visual attention to the right, following commands with increased time for processing. Pt able to complete multiple sit<>stand for peri care, and a stand pivot to the right with max A +2 to the recliner. Then set up in recliner for self-feeding - utilizing LUE (non-dominant) - grasp was abnormal but attribute this to non-dominant hand - may need to watch for need for grips?!? OT will continue to follow acutely.  ? ?Recommendations for follow up therapy are one component of a multi-disciplinary discharge planning process, led by the attending physician.  Recommendations may be updated based on patient status, additional functional criteria and insurance authorization. ?   ?Follow Up Recommendations ? Skilled nursing-short term rehab (<3 hours/day)  ?  ?Assistance Recommended at Discharge Frequent or constant Supervision/Assistance  ?Patient can return home with the following ? Two people to help with walking and/or transfers;Two people to help with bathing/dressing/bathroom;Direct supervision/assist for medications management;Direct supervision/assist for financial management;Assistance with cooking/housework;Assist for transportation;Help with stairs or ramp for entrance;Assistance with feeding ?  ?Equipment Recommendations ? Other (comment) (defer to next venue of care)  ?  ?Recommendations for Other  Services PT consult;Speech consult ? ?  ?Precautions / Restrictions Precautions ?Precautions: Fall ?Precaution Comments: Pt pushes to the R ?Restrictions ?Weight Bearing Restrictions: No  ? ? ?  ? ?Mobility Bed Mobility ?Overal bed mobility: Needs Assistance ?Bed Mobility: Supine to Sit, Sit to Supine ?  ?  ?Supine to sit: Mod assist, +2 for physical assistance ?  ?  ?General bed mobility comments: pt requires assist to mobilize RLE, pulls into sitting with PT hand hold, maxA to maintain sitting balance initially ?  ? ?Transfers ?Overall transfer level: Needs assistance ?Equipment used: 1 person hand held assist, 2 person hand held assist ?Transfers: Sit to/from Stand, Bed to chair/wheelchair/BSC ?Sit to Stand: Max assist, +2 physical assistance ?  ?Squat pivot transfers: Max assist ?  ?  ?  ?General transfer comment: pt with stronge right lateral lean intially, does demonstrate ability to correct some with LUE support and verbal cues although modA at best ?  ?  ?Balance Overall balance assessment: Needs assistance ?Sitting-balance support: Single extremity supported, No upper extremity supported ?Sitting balance-Leahy Scale: Poor ?Sitting balance - Comments: maxA initially, improves to minG at times. Intermittent pushing to right with LUE ?Postural control: Right lateral lean ?Standing balance support: Single extremity supported ?Standing balance-Leahy Scale: Poor ?Standing balance comment: maxA, R lateral lean ?  ?  ?  ?  ?  ?  ?  ?  ?  ?  ?  ?   ? ?ADL either performed or assessed with clinical judgement  ? ?ADL Overall ADL's : Needs assistance/impaired ?Eating/Feeding: Minimal assistance;Sitting ?Eating/Feeding Details (indicate cue type and reason): using LUE to self-feed, may benefit from grip? ?  ?  ?  ?  ?  ?  ?  ?  ?  ?  ?Toilet Transfer: Maximal assistance;+2 for physical  assistance;+2 for safety/equipment;Squat-pivot;BSC/3in1 ?Toilet Transfer Details (indicate cue type and reason): simulated through  recliner transfer ?Toileting- Clothing Manipulation and Hygiene: +2 for safety/equipment;+2 for physical assistance;Maximal assistance ?Toileting - Clothing Manipulation Details (indicate cue type and reason): Pt able to stand with max of 1 and OT performed rear peri care in standing ?  ?  ?Functional mobility during ADLs: Maximal assistance;+2 for physical assistance;+2 for safety/equipment ?General ADL Comments: Pt with delayed processing, Pt using L hand (non-dominant) so coordinaton deficits ?  ? ?Extremity/Trunk Assessment   ?  ?  ?  ?  ?  ? ?Vision   ?Additional Comments: Pt with continued tracking visually to the right ?  ?Perception   ?  ?Praxis   ?  ? ?Cognition Arousal/Alertness: Awake/alert ?Behavior During Therapy: [facility] for tasks assessed/performed ?Overall Cognitive Status: Impaired/Different from baseline ?Area of Impairment: Problem solving ?  ?  ?  ?  ?  ?  ?  ?  ?  ?  ?  ?  ?  ?  ?Problem Solving: Slow processing, Requires verbal cues, Requires tactile cues ?General Comments: pt appears to required increased time to process, however difficult to assess due to expressive aphasia. Pt also appears to get frustrated but participates well this session ?  ?  ?   ?Exercises Exercises: Other exercises ?Other Exercises ?Other Exercises: lateral leans down to Left elbow to counter balance right pushing ? ?  ?Shoulder Instructions   ? ? ?  ?General Comments VSS on RA  ? ? ?Pertinent Vitals/ Pain       Pain Assessment ?Pain Assessment: No/denies pain ?Pain Intervention(s): Monitored during session ? ?Home Living   ?  ?  ?  ?  ?  ?  ?  ?  ?  ?  ?  ?  ?  ?  ?  ?  ?  ?  ? ?  ?Prior Functioning/Environment    ?  ?  ?  ?   ? ?Frequency ? Min 2X/week  ? ? ? ? ?  ?Progress Toward Goals ? ?OT Goals(current goals can now be found in the care plan section) ? Progress towards OT goals: Progressing toward goals ? ?Acute Rehab OT Goals ?Patient Stated Goal: none stated ?OT Goal Formulation: Patient unable to participate in  goal setting ?Time For Goal Achievement: 10/06/21 ?Potential to Achieve Goals: Good  ?Plan Discharge plan remains appropriate;Frequency remains appropriate   ? ?Co-evaluation ? ? ? PT/OT/SLP Co-Evaluation/Treatment: Yes ?Reason for Co-Treatment: Complexity of the patient's impairments (multi-system involvement);For patient/therapist safety;To address functional/ADL transfers;Necessary to address cognition/behavior during functional activity ?PT goals addressed during session: Mobility/safety with mobility;Balance;Strengthening/ROM ?OT goals addressed during session: ADL's and self-care;Strengthening/ROM ?  ? ?  ?AM-PAC OT "6 Clicks" Daily Activity     ?Outcome Measure ? ? Help from another person eating meals?: A Lot ?Help from another person taking care of personal grooming?: A Lot ?Help from another person toileting, which includes using toliet, bedpan, or urinal?: A Lot ?Help from another person bathing (including washing, rinsing, drying)?: A Lot ?Help from another person to put on and taking off regular upper body clothing?: A Lot ?Help from another person to put on and taking off regular lower body clothing?: Total ?6 Click Score: 11 ? ?  ?End of Session   ? ?OT Visit Diagnosis: Unsteadiness on feet (R26.81);Other abnormalities of gait and mobility (R26.89);Muscle weakness (generalized) (M62.81);Low vision, both eyes (H54.2);Other symptoms and signs involving cognitive function;Hemiplegia and hemiparesis;Cognitive communication deficit (R41.841) ?Symptoms  and signs involving cognitive functions: Nontraumatic intracerebral hemorrhage ?Hemiplegia - Right/Left: Right ?Hemiplegia - dominant/non-dominant: Dominant ?Hemiplegia - caused by: Nontraumatic intracerebral hemorrhage ?  ?Activity Tolerance Patient tolerated treatment well ?  ?Patient Left in chair;with call bell/phone within reach;with chair alarm set;with nursing/sitter in room (NT feeding patient) ?  ?Nurse Communication Mobility status;Other (comment)  (need for new primo fit) ?  ? ?   ? ?Time: [id number] ?OT Time Calculation (min): 28 min ? ?Charges: OT General Charges ?$OT Visit: 1 Visit ?OT Treatments ?$Self Care/Home Management : 8-22 mins ?Nyoka CowdenLaura H OTR

## 2021-09-24 LAB — GLUCOSE, CAPILLARY
Glucose-Capillary: 96 mg/dL (ref 70–99)
Glucose-Capillary: 82 mg/dL (ref 70–99)
Glucose-Capillary: 154 mg/dL — ABNORMAL HIGH (ref 70–99)
Glucose-Capillary: 136 mg/dL — ABNORMAL HIGH (ref 70–99)

## 2021-09-24 NOTE — Progress Notes (Signed)
Speech Language Pathology Treatment: Dysphagia;Cognitive-Linquistic  ?Patient Details ?Name: Joshua Price ?MRN: [id number] ?DOB: 1961-05-06 ?Today's Date: 09/24/2021 ?Time: [id number] ?SLP Time Calculation (min) (ACUTE ONLY): 19 min ? ?Assessment / Plan / Recommendation ?Clinical Impression ? Joshua Price seen for language and dysphagia with mom and visitor (aunt?) present. Joshua Price was frustrated today that listeners not understanding but appeared to be attempting to tell us something else-  said the word "apartment." Speech with decreased intelligibility not only from aphasia but dysarthria as well. His rate is fast, some dysfluency on initial sounds and not stimulable with cues. He did independently and intelligibly tell the nutrition ambassador that his name is "Joshua Price." More verbalizations noted today.   ? ?He would not consume thick grape juice SLP prepared or juice/lunch items on lunch tray- would not attempt more solid texture of graham cracker or thin water. He also pushed his tray away and would not consume with encouragement. Will continue efforts.  ?  ?HPI HPI: Patient is a 61 y.o. male with PMH: HTN, tobacco use, esophageal varices who presented to ED via EMS on 2/25 as a code stroke after being found down and unable to get up on concrete porch of his home. EMS noted right sided weakness and incoherent speech. In ED, stat CT revealed acute left basal ganglia hemorrhage. on 2/25, MRI head revealed appearance of increasing size of left basal ganglia intraparenchymal hematoma and slightly increasing mass effect on left lateral ventricle and 53mm of left to right midline shift. MD concerned that pt is not getting enough free water and sodium going up and wanted to repeat MBS. RN stated he drinks the nectar thick adequately. ?  ?   ?SLP Plan ? Continue with current plan of care ? ?  ?  ?Recommendations for follow up therapy are one component of a multi-disciplinary discharge planning process, led by the attending  physician.  Recommendations may be updated based on patient status, additional functional criteria and insurance authorization. ?  ? ?Recommendations  ?Diet recommendations: Dysphagia 1 (puree);Nectar-thick liquid ?Liquids provided via: Cup;Straw ?Medication Administration: Crushed with puree ?Supervision: Staff to assist with self feeding;Full supervision/cueing for compensatory strategies ?Compensations: Slow rate;Small sips/bites;Lingual sweep for clearance of pocketing ?Postural Changes and/or Swallow Maneuvers: Seated upright 90 degrees  ?   ?    ?   ? ? ? ? Oral Care Recommendations: Oral care BID ?Follow Up Recommendations: Skilled nursing-short term rehab (<3 hours/day) ?Assistance recommended at discharge: Frequent or constant Supervision/Assistance ?SLP Visit Diagnosis: Dysphagia, oropharyngeal phase (R13.12);Aphasia (R47.01) ?Plan: Continue with current plan of care ? ? ? ? ?  ?  ? ? ?Houston Siren ? ?09/24/2021, 1:50 PM ?

## 2021-09-24 NOTE — Progress Notes (Addendum)
?PROGRESS NOTE ? ?Joshua Price  [id number] DOB: 1961/07/11 DOA: 09/05/2021 ?PCP: Elbert Ewings, FNP  ? ?Brief Narrative: ?61 y.o. male with history of HTN, smoking, esophageal varcies presenting after he was found down on the concrete porch of his home.  He has been on the phone speaking with a relative and she heard him fall and he was able to state that he was on the ground and could not get up family member stated that this happened at 1645 on 2/25 and the son was able to get there within 5 minutes, he then called EMS.  On arrival he had decreased alertness, right hemineglect, dysarthric speech.  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  He was admitted to the neuro ICU.  CT scans were repeated which showed stable findings.   Stay complicated by hypernatremia and aspiration pna.  Continues to have significant dysarthria.  Placement issue.  Plan for skilled nursing facility placement.  TOC following ?  ? ?   ? ?Assessment & Plan: ? ?Principal Problem: ?  ICH (intracerebral hemorrhage) ([place]) ?Active Problems: ?  Hypernatremia ?  Aspiration pneumonia ([place]) ?  Essential hypertension ?  Altered mental status ?  Alcohol withdrawal ([place]) ?  Oropharyngeal dysphagia ?  Malnutrition of moderate degree ?  Hypokalemia ?  Hyperglycemia ? ? ?Assessment and Plan: ?* ICH (intracerebral hemorrhage) ([place]) ?Intraparenchymal hematoma in the left basal ganglia likely secondary hypertensive source.  Cerebral edema was noted on imaging studies. ?-initially admitted to the neuro ICU.  Started on hypertonic saline.  CT head was repeated which showed stable findings with stable midline shift. ?-Has right hemiplegia, severe dysarthria ?-Echocardiogram shows normal systolic function with grade 1 diastolic dysfunction. ?-Neurology has signed off. ?-PT and OT is following.  Apparently not a good candidate for inpatient rehabilitation due to lack of family support.  Will eventually need to go to  skilled nursing facility for rehab but currently no payer source ?- Prognosis is poor- patient still with functional deficits.  Goals of care was discussed with family by palliative care.  Remains full code, continue full scope of treatment for now. ? ?Aspiration pneumonia ([place]) ?Patient developed fever on 3/3.  UA was unremarkable and not suggestive of infection.  Chest x-ray showed left lower lung infiltrate.  Patient with significant oropharyngeal dysphagia.  Likely aspirated.  Started on Augmentin.   ?-s/p 5-day course. ?-He is currently on room air ? ?Hypernatremia ?resolved ? ? ?Altered mental status ?-sequelae of ICH   ?-not sure how much patient will improve ? ? ?Essential hypertension ?Presented with hypertensive emergency.  Currently on amlodipine and metoprolol.  Blood pressure has improved.  Continue current treatment. ? ?Oropharyngeal dysphagia ?-tube feeds d/c'd 3/9 ?-DYS diet- has to be fed ?-aspiration precautions ? ?Alcohol withdrawal ([place]) ?-s/p CIWA protocol ?-resolved ?Noted to be on thiamine multivitamins folic acid. ? ?Malnutrition of moderate degree ?Nutrition Status: ?Nutrition Problem: Moderate Malnutrition ?Etiology: social / environmental circumstances (EtOH abuse) ?Signs/Symptoms: mild fat depletion, moderate fat depletion, mild muscle depletion, severe muscle depletion ?Interventions: Prostat, Tube feeding, MVI ? ?-off tube feeding now ? ? ?Hypokalemia ?Continue daily supplementation, monitor levels ? ?Hyperglycemia ?Continue  SSI coverage  ?-Last hemoglobin A1c of 5.3 ? ? ? ? ?  ? ? ?Nutrition Problem: Moderate Malnutrition ?Etiology: social / environmental circumstances (EtOH abuse) ?  ? ?DVT prophylaxis:heparin injection 5,000 Units Start: 09/12/21 2200 ?SCD's Start: 09/05/21 1942 ? ? ?  Code Status: Full Code ? ?Family Communication: None  at bedside ? ?Patient status:Inpatient ? ?Patient is from :Home ? ?Anticipated discharge to: SNF ? ?Estimated DC date: Pending  placement ? ? ?Consultants: Neurology, palliative care ? ?Procedures: None ? ?Antimicrobials:  ?Anti-infectives (From admission, onward)  ? ? Start     Dose/Rate Route Frequency Ordered Stop  ? 09/12/21 1215  amoxicillin-clavulanate (AUGMENTIN) 400-57 MG/5ML suspension 875 mg       ?Note to Pharmacy: Please adjust dose as indicated. For aspiration pna. thanks  ? 875 mg Per Tube Every 12 hours 09/12/21 1117 09/16/21 2100  ? ?  ? ? ?Subjective: ? ?Patient seen and examined at the bedside this morning.  Hemodynamically stable.  No changes from yesterday.  He was lying on bed.  He tries to speak but his speech is not clear, not understandable.  He tries to follow commands.  Not in any kind of distress. ? ? ?Objective: ?Vitals:  ? 09/23/21 2305 09/24/21 0325 09/24/21 0332 09/24/21 0734  ?BP: 119/76 125/80  124/85  ?Pulse: 69 72  75  ?Resp: 20 16  14   ?Temp: 98.3 ?F (36.8 ?C) 99.1 ?F (37.3 ?C)  97.7 ?F (36.5 ?C)  ?TempSrc: Oral Oral  Oral  ?SpO2: 98% 95%  98%  ?Weight:   75.1 kg   ?Height:      ? ? ?Intake/Output Summary (Last 24 hours) at 09/24/2021 1119 ?Last data filed at 09/24/2021 [id number] ?Gross per 24 hour  ?Intake 750 ml  ?Output 1400 ml  ?Net -650 ml  ? ?Filed Weights  ? 09/22/21 0500 09/23/21 0500 09/24/21 0332  ?Weight: 72.6 kg 72.7 kg 75.1 kg  ? ? ?Examination: ? ? ?General exam: Overall comfortable , lying in bed ?HEENT: PERRL ?Respiratory system:  no wheezes or crackles  ?Cardiovascular system: S1 & S2 heard, RRR.  ?Gastrointestinal system: Abdomen is nondistended, soft and nontender. ?Central nervous system: Alert and awake but not oriented, follows commands, right hemiplegia ?Extremities: No edema, no clubbing ,no cyanosis ?Skin: No rashes, no ulcers,no icterus   ? ? ?Data Reviewed: I have personally reviewed following labs and imaging studies ? ?CBC: ?Recent Labs  ?Lab 09/19/21 ?[id number] 09/20/21 ?0149 09/21/21 ?[id number] 09/22/21 ?[id number] 09/23/21 ?0339  ?WBC 7.7 8.4 8.2 7.4 7.0  ?HGB 12.6* 12.4* 12.6* 11.7* 10.6*  ?HCT  37.9* 37.3* 38.7* 35.4* 32.9*  ?MCV 98.4 100.0 101.6* 98.9 97.3  ?PLT 292 311 309 231 268  ? ?Basic Metabolic Panel: ?Recent Labs  ?Lab 09/19/21 ?[id number] 09/20/21 ?0149 09/21/21 ?[id number] 09/22/21 ?[id number] 09/23/21 ?0339  ?NA 145 149* 151* 146* 140  ?K 4.3 3.8 3.6 3.6 3.5  ?CL 110 116* 116* 115* 107  ?CO2 25 24 26 23  21*  ?GLUCOSE 257* 135* 137* 112* 103*  ?BUN 25* 21* 22* 20 15  ?CREATININE 1.01 0.92 1.06 0.91 0.81  ?CALCIUM 10.8* 10.2 9.9 9.1 8.6*  ? ? ? ?No results found for this or any previous visit (from the past 240 hour(s)).  ? ?Radiology Studies: ?No results found. ? ?Scheduled Meds: ? amLODipine  10 mg Oral Daily  ? vitamin C  500 mg Oral BID  ? chlorhexidine  15 mL Mouth Rinse BID  ? Chlorhexidine Gluconate Cloth  6 each Topical Daily  ? cholecalciferol  1,000 Units Oral Daily  ? folic acid  1 mg Oral Daily  ? heparin injection (subcutaneous)  5,000 Units Subcutaneous Q8H  ? insulin aspart  0-15 Units Subcutaneous TID WC  ? insulin aspart  0-5 Units Subcutaneous QHS  ? mouth rinse  15 mL  Mouth Rinse q12n4p  ? metoprolol tartrate  25 mg Oral BID  ? multivitamin with minerals  1 tablet Oral Daily  ? nystatin  5 mL Oral QID  ? pantoprazole  40 mg Oral QHS  ? ?Continuous Infusions: ? ? ? ? LOS: 19 days  ? ?Shelly Coss, MD ?[facility] ?P3/16/2023, 11:19 AM  ? ?

## 2021-09-24 NOTE — Progress Notes (Signed)
No insurance

## 2021-09-25 LAB — GLUCOSE, CAPILLARY
Glucose-Capillary: 101 mg/dL — ABNORMAL HIGH (ref 70–99)
Glucose-Capillary: 112 mg/dL — ABNORMAL HIGH (ref 70–99)
Glucose-Capillary: 150 mg/dL — ABNORMAL HIGH (ref 70–99)
Glucose-Capillary: 143 mg/dL — ABNORMAL HIGH (ref 70–99)

## 2021-09-25 LAB — BASIC METABOLIC PANEL
Anion gap: 9 (ref 5–15)
BUN: 12 mg/dL (ref 6–20)
CO2: 23 mmol/L (ref 22–32)
Calcium: 9 mg/dL (ref 8.9–10.3)
Chloride: 107 mmol/L (ref 98–111)
Creatinine, Ser: 0.79 mg/dL (ref 0.61–1.24)
GFR, Estimated: >60 mL/min (ref >60)
Glucose, Bld: 98 mg/dL (ref 70–99)
Potassium: 3.2 mmol/L — ABNORMAL LOW (ref 3.5–5.1)
Sodium: 139 mmol/L (ref 135–145)

## 2021-09-25 MED ORDER — POTASSIUM CHLORIDE 20 MEQ PO PACK
40.0000 meq | PACK | Freq: Every day | ORAL | Status: DC
  Administered 2021-09-26 – 2021-10-01 (×6): 40 meq via ORAL
  Filled 2021-09-25 (×6): qty 2

## 2021-09-25 MED ORDER — THIAMINE HCL 100 MG PO TABS
100.0000 mg | ORAL_TABLET | Freq: Every day | ORAL | Status: DC
  Administered 2021-09-26 – 2021-10-01 (×6): 100 mg via ORAL
  Filled 2021-09-25 (×6): qty 1

## 2021-09-25 MED ORDER — POTASSIUM CHLORIDE 10 MEQ/100ML IV SOLN
10.0000 meq | INTRAVENOUS | Status: AC
  Administered 2021-09-25 (×4): 10 meq via INTRAVENOUS
  Filled 2021-09-25 (×4): qty 100

## 2021-09-25 NOTE — Progress Notes (Signed)
?PROGRESS NOTE ? ?Joshua Price  [id number] DOB: Feb 01, 1961 DOA: 09/05/2021 ?PCP: Dartha LodgeSteele, Anthony, FNP  ? ?Brief Narrative: ?61 y.o. male with history of HTN, smoking, esophageal varcies presenting after he was found down on the concrete porch of his home.  He has been on the phone speaking with a relative and she heard him fall and he was able to state that he was on the ground and could not get up family member stated that this happened at 1645 on 2/25 and the son was able to get there within 5 minutes, he then called EMS.  On arrival he had decreased alertness, right hemineglect, dysarthric speech.  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  He was admitted to the neuro ICU.  CT scans were repeated which showed stable findings.   Stay complicated by hypernatremia and aspiration pna.  Continues to have significant dysarthria.  Placement issue.  Plan for skilled nursing facility placement.  TOC following ?  ? ?   ? ?Assessment & Plan: ? ?Principal Problem: ?  ICH (intracerebral hemorrhage) ([facility]) ?Active Problems: ?  Hypernatremia ?  Aspiration pneumonia ([facility]) ?  Essential hypertension ?  Altered mental status ?  Alcohol withdrawal ([facility]) ?  Oropharyngeal dysphagia ?  Malnutrition of moderate degree ?  Hypokalemia ?  Hyperglycemia ? ? 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  Cerebral edema was noted on imaging studies. ?-initially admitted to the neuro ICU.  Started on hypertonic saline.  CT head was repeated which showed stable findings with stable midline shift. ?-Has right hemiplegia, severe dysarthria ?-Echocardiogram shows normal systolic function with grade 1 diastolic dysfunction. ?-Neurology has signed off. ?-PT and OT is following.  Apparently not a good candidate for inpatient rehabilitation due to lack of family support.  Will eventually need to go to  skilled nursing facility for rehab but currently no payer source ?- Prognosis is poor- patient still with functional deficits.  Goals of care was discussed with family by palliative care.  Remains full code, continue full scope of treatment for now. ? ?Aspiration pneumonia ([facility]) ?Patient developed fever on 3/3.  UA was unremarkable and not suggestive of infection.  Chest x-ray showed left lower lung infiltrate.  Patient with significant oropharyngeal dysphagia.  Likely aspirated.  Started on Augmentin.   ?-s/p 5-day course. ?-He is currently on room air ?-On dysphagia 1 diet.  Speech therapy following ? ?Hypernatremia ?resolved ? ? ?Altered mental status ?-sequelae of ICH   ?-not sure how much patient will improve ? ? ?Essential hypertension ?Presented with hypertensive emergency.  Currently on amlodipine and metoprolol.  Blood pressure has improved.  Continue current treatment. ? ?Oropharyngeal dysphagia ?-tube feeds d/c'd 3/9 ?-DYS diet- has to be fed ?-aspiration precautions ? ?Alcohol withdrawal ([facility]) ?-s/p CIWA protocol ?-resolved ?Noted to be on thiamine, multivitamins, folic acid. ? ?Malnutrition of moderate degree ?Nutrition Status: ?Nutrition Problem: Moderate Malnutrition ?Etiology: social / environmental circumstances (EtOH abuse) ?Signs/Symptoms: mild fat depletion, moderate fat depletion, mild muscle depletion, severe muscle depletion ?Interventions: Prostat, Tube feeding, MVI ? ?-off tube feeding now ? ? ?Hypokalemia ?Continue daily supplementation, monitor levels ? ?Hyperglycemia ?Continue  SSI coverage  ?-Last hemoglobin A1c of 5.3 ? ? ? ? ?  ? ? ?Nutrition Problem: Moderate Malnutrition ?Etiology: social / environmental circumstances (EtOH abuse) ?  ? ?DVT prophylaxis:heparin injection 5,000 Units Start: 09/12/21 2200 ?SCD's Start: 09/05/21 1942 ? ? ?  Code Status: Full Code ? ?Family Communication: None at bedside ? ?Patient status:Inpatient ? ?Patient is from :Home ? ?Anticipated discharge to:  SNF ? ?Estimated DC date: Pending placement ? ? ?Consultants: Neurology, palliative care ? ?Procedures: None ? ?Antimicrobials:  ?Anti-infectives (From admission, onward)  ? ? Start     Dose/Rate Route Frequency Ordered Stop  ? 09/12/21 1215  amoxicillin-clavulanate (AUGMENTIN) 400-57 MG/5ML suspension 875 mg       ?Note to Pharmacy: Please adjust dose as indicated. For aspiration pna. thanks  ? 875 mg Per Tube Every 12 hours 09/12/21 1117 09/16/21 2100  ? ?  ? ? ?Subjective: ? ?Patient seen and examined at the bedside this morning.  Hemodynamically stable.  Lying on bed as yesterday.  Severe dysarthria, speech cannot be understood.  Tries to follow commands.  Weak on the right side. ? ?Objective: ?Vitals:  ? 09/24/21 2310 09/25/21 0301 09/25/21 0500 09/25/21 0734  ?BP: 104/74 (!) 146/89  114/78  ?Pulse: 63 78  77  ?Resp: 14 19  16   ?Temp: 97.6 ?F (36.4 ?C) 99.9 ?F (37.7 ?C)  97.9 ?F (36.6 ?C)  ?TempSrc: Axillary Oral  Oral  ?SpO2: 99% 95%  95%  ?Weight:   75.1 kg   ?Height:      ? ? ?Intake/Output Summary (Last 24 hours) at 09/25/2021 1051 ?Last data filed at 09/25/2021 0830 ?Gross per 24 hour  ?Intake 360 ml  ?Output 2000 ml  ?Net -1640 ml  ? ?Filed Weights  ? 09/23/21 0500 09/24/21 0332 09/25/21 0500  ?Weight: 72.7 kg 75.1 kg 75.1 kg  ? ? ?Examination: ? ?General exam: Overall comfortable, not in distress, very deconditioned ?HEENT: PERRL ?Respiratory system:  no wheezes or crackles  ?Cardiovascular system: S1 & S2 heard, RRR.  ?Gastrointestinal system: Abdomen is nondistended, soft and nontender. ?Central nervous system: Alert, awake but not oriented, tries to follow commands, right hemiplegia ?Extremities: No edema, no clubbing ,no cyanosis ?Skin: No rashes, no ulcers,no icterus     ? ? ?Data Reviewed: I have personally reviewed following labs and imaging studies ? ?CBC: ?Recent Labs  ?Lab 09/19/21 ?11/19/21 09/20/21 ?0149 09/21/21 ?09/23/21 09/22/21 ?09/24/21 09/23/21 ?09/25/21  ?WBC 7.7 8.4 8.2 7.4 7.0  ?HGB 12.6* 12.4* 12.6*  11.7* 10.6*  ?HCT 37.9* 37.3* 38.7* 35.4* 32.9*  ?MCV 98.4 100.0 101.6* 98.9 97.3  ?PLT 292 311 309 231 268  ? ?Basic Metabolic Panel: ?Recent Labs  ?Lab 09/20/21 ?0149 09/21/21 ?0823 09/22/21 ?09/24/21 09/23/21 ?09/25/21 09/25/21 ?0059  ?NA 149* 151* 146* 140 139  ?K 3.8 3.6 3.6 3.5 3.2*  ?CL 116* 116* 115* 107 107  ?CO2 24 26 23  21* 23  ?GLUCOSE 135* 137* 112* 103* 98  ?BUN 21* 22* 20 15 12   ?CREATININE 0.92 1.06 0.91 0.81 0.79  ?CALCIUM 10.2 9.9 9.1 8.6* 9.0  ? ? ? ?No results found for this or any previous visit (from the past 240 hour(s)).  ? ?Radiology Studies: ?No results found. ? ?Scheduled Meds: ? amLODipine  10 mg Oral Daily  ? vitamin C  500 mg Oral BID  ? chlorhexidine  15 mL Mouth Rinse BID  ? Chlorhexidine Gluconate Cloth  6 each Topical Daily  ? cholecalciferol  1,000 Units Oral Daily  ? folic acid  1 mg Oral Daily  ? heparin injection (subcutaneous)  5,000 Units Subcutaneous Q8H  ? insulin aspart  0-15 Units Subcutaneous TID WC  ? insulin aspart  0-5 Units Subcutaneous QHS  ? mouth rinse  15 mL Mouth Rinse  q12n4p  ? metoprolol tartrate  25 mg Oral BID  ? multivitamin with minerals  1 tablet Oral Daily  ? nystatin  5 mL Oral QID  ? pantoprazole  40 mg Oral QHS  ? [START ON 09/26/2021] potassium chloride  40 mEq Oral Daily  ? thiamine  100 mg Oral Daily  ? ?Continuous Infusions: ? potassium chloride 10 mEq (09/25/21 0954)  ? ? ? ? LOS: 20 days  ? ?Burnadette Pop, MD ?[facility] ?P3/17/2023, 10:51 AM  ? ?

## 2021-09-25 NOTE — [place] FL2 (Signed)
? MEDICAID FL2 LEVEL OF CARE SCREENING TOOL  ?  ? ?IDENTIFICATION  ?Patient Name: ?Joshua Price Birthdate: September 06, 1960 Sex: male Admission Date (Current Location): ?09/05/2021  ?[place] and [place] Number: ? [place] ?  Facility and Address:  ?[facility], [place] [address] [place], [place] [address] ?     Provider Number: ?[id number]  ?Attending Physician Name and Address:  ?Shelly Coss, MD ? Relative Name and Phone Number:  ?  ?   ?Current Level of Care: ?Hospital Recommended Level of Care: ?[place] Prior Approval Number: ?  ? ?Date Approved/Denied: ?  PASRR Number: ?  ? ?Discharge Plan: ?SNF ?  ? ?Current Diagnoses: ?Patient Active Problem List  ? Diagnosis Date Noted  ? Hyperglycemia 09/16/2021  ? Aspiration pneumonia ([place]) 09/13/2021  ? Fever 09/12/2021  ? Cerebral edema ([place]) 09/11/2021  ? Alcohol withdrawal ([place]) 09/11/2021  ? Hypokalemia 09/11/2021  ? Hypernatremia 09/11/2021  ? Oropharyngeal dysphagia 09/11/2021  ? Malnutrition of moderate degree 09/09/2021  ? ICH (intracerebral hemorrhage) ([place]) 09/05/2021  ? Altered mental status   ? Essential hypertension 08/22/2013  ? Smoking 08/22/2013  ? Dental cavities 08/22/2013  ? ? ?Orientation RESPIRATION BLADDER Height & Weight   ?  ?Self ? Normal External catheter, Incontinent Weight: 165 lb 9.1 oz (75.1 kg) ?Height:  5\' 6"  (167.6 cm)  ?BEHAVIORAL SYMPTOMS/MOOD NEUROLOGICAL BOWEL NUTRITION STATUS  ?    Incontinent Diet (please see discharge summary)  ?AMBULATORY STATUS COMMUNICATION OF NEEDS Skin   ?Extensive Assist Verbally Normal ?  ?  ?  ?    ?     ?     ? ? ?Personal Care Assistance Level of Assistance  ?Bathing, Feeding, Dressing Bathing Assistance: Maximum assistance ?Feeding assistance: Limited assistance ?Dressing Assistance: Maximum assistance ?   ? ?Functional Limitations Info  ?Sight, Hearing, Speech Sight Info: Adequate ?Hearing Info: Adequate ?Speech Info: Impaired  ? ? ?SPECIAL CARE FACTORS  FREQUENCY  ?PT (By licensed PT), OT (By licensed OT)   ?  ?PT Frequency: 5x per week ?OT Frequency: 5x per week ?  ?  ?  ?   ? ? ?Contractures Contractures Info: Not present  ? ? ?Additional Factors Info  ?Code Status, Allergies Code Status Info: Full ?Allergies Info: NKA ?  ?  ?  ?   ? ?Current Medications (09/25/2021):  This is the current hospital active medication list ?Current Facility-Administered Medications  ?Medication Dose Route Frequency Provider Last Rate Last Admin  ? acetaminophen (TYLENOL) tablet 650 mg  650 mg Oral Q4H PRN Kerney Elbe, MD   650 mg at 09/13/21 2019  ? Or  ? acetaminophen (TYLENOL) 160 MG/5ML solution 650 mg  650 mg Per Tube Q4H PRN Kerney Elbe, MD   650 mg at 09/11/21 2328  ? Or  ? acetaminophen (TYLENOL) suppository 650 mg  650 mg Rectal Q4H PRN Kerney Elbe, MD      ? amLODipine (NORVASC) tablet 10 mg  10 mg Oral Daily Vann, Jessica U, DO   10 mg at 09/25/21 0900  ? ascorbic acid (VITAMIN C) tablet 500 mg  500 mg Oral BID Vann, Jessica U, DO   500 mg at 09/25/21 0900  ? chlorhexidine (PERIDEX) 0.12 % solution 15 mL  15 mL Mouth Rinse BID Bonnielee Haff, MD   15 mL at 09/25/21 0900  ? Chlorhexidine Gluconate Cloth 2 % PADS 6 each  6 each Topical Daily Kerney Elbe, MD   6 each at 09/25/21 0900  ?  cholecalciferol (VITAMIN D3) tablet 1,000 Units  1,000 Units Oral Daily Eulogio Bear U, DO   1,000 Units at 09/25/21 0900  ? folic acid (FOLVITE) tablet 1 mg  1 mg Oral Daily Vann, Jessica U, DO   1 mg at 09/25/21 0900  ? heparin injection 5,000 Units  5,000 Units Subcutaneous Q8H Rosalin Hawking, MD   5,000 Units at 09/25/21 [id number]  ? hydrALAZINE (APRESOLINE) injection 20 mg  20 mg Intravenous Q6H PRN Janine Ores, NP   20 mg at 09/11/21 0526  ? insulin aspart (novoLOG) injection 0-15 Units  0-15 Units Subcutaneous TID WC Vann, Jessica U, DO   3 Units at 09/23/21 1640  ? insulin aspart (novoLOG) injection 0-5 Units  0-5 Units Subcutaneous QHS Eulogio Bear U, DO   5 Units at 09/20/21 2112   ? labetalol (NORMODYNE) injection 10 mg  10 mg Intravenous Q2H PRN de Yolanda Manges, Cortney E, NP   10 mg at 09/11/21 0433  ? lip balm (CARMEX) ointment   Topical PRN Bonnielee Haff, MD      ? MEDLINE mouth rinse  15 mL Mouth Rinse q12n4p Bonnielee Haff, MD   15 mL at 09/25/21 1131  ? metoprolol tartrate (LOPRESSOR) tablet 25 mg  25 mg Oral BID Vann, Jessica U, DO   25 mg at 09/25/21 0900  ? multivitamin with minerals tablet 1 tablet  1 tablet Oral Daily Eulogio Bear U, DO   1 tablet at 09/25/21 0900  ? nystatin (MYCOSTATIN) 100000 UNIT/ML suspension 500,000 Units  5 mL Oral QID Kaleen Mask, RPH   500,000 Units at 09/25/21 0900  ? pantoprazole (PROTONIX) EC tablet 40 mg  40 mg Oral QHS Eulogio Bear U, DO   40 mg at 09/24/21 2154  ? polyethylene glycol (MIRALAX / GLYCOLAX) packet 17 g  17 g Per Tube Daily PRN Bonnielee Haff, MD      ? Derrill Memo ON 09/26/2021] potassium chloride (KLOR-CON) packet 40 mEq  40 mEq Oral Daily Adhikari, Amrit, MD      ? thiamine tablet 100 mg  100 mg Oral Daily Shelly Coss, MD      ? ? ? ?Discharge Medications: ?Please see discharge summary for a list of discharge medications. ? ?Relevant Imaging Results: ? ?Relevant Lab Results: ? ? ?Additional Information ?SSN [id number] ? ?Vinie Sill, LCSW ? ? ? ? ?

## 2021-09-25 NOTE — Progress Notes (Signed)
Physical Therapy Treatment ?Patient Details ?Name: Joshua Price ?MRN: [id number] ?DOB: February 07, 1961 ?Today's Date: 09/25/2021 ? ? ?History of Present Illness 61 yo male presents to [facility] on 2/25 with fall at home, + ETOH, BP 218/134. CT head reveals acute parenchymal hemorrhage involving left lentiform nucleus and  surrounding white matter; repeat CT shows interval increase in side of intraparenchymal hematoma centered in L BG, increased edema and 48mm L-to-R midline shift. PMH includes HTN, smoker, esophageal varices. ? ?  ?PT Comments  ? ? Pt was initially agitated since therapy was interrupting the end of the basketball game on TV. Pt was able to sit EOB in front of a mirror and more easily correct himself to midline. Pt needed inconsistent amount of support during sitting balance; sometimes requiring max assist and other times requiring min guard. He performed self care tasks sitting EOB with simple step by step cuing. Pt was unable to sequence sitting up at EOB due to inability to attend to the right and sequence movements. He was unable to attend to the head of the bed in order to lay back down and required max assist for sit to supine. Pt continues to have deficits in focus that limit therapy session and gets frustrated with impairments. During treatment session patient showed deficits in strength, endurance, activity tolerance. Recommending therapy services at skilled nursing facility to address the previously stated deficits. Will continue to follow acutely to maximize functional mobility, independence, and safety. ? ?  ?Recommendations for follow up therapy are one component of a multi-disciplinary discharge planning process, led by the attending physician.  Recommendations may be updated based on patient status, additional functional criteria and insurance authorization. ? ?Follow Up Recommendations ? Skilled nursing-short term rehab (<3 hours/day) ?  ?  ?Assistance Recommended at Discharge Frequent or constant  Supervision/Assistance  ?Patient can return home with the following Two people to help with walking and/or transfers;Two people to help with bathing/dressing/bathroom;Direct supervision/assist for medications management;Direct supervision/assist for financial management;Assist for transportation;Help with stairs or ramp for entrance;Assistance with cooking/housework;Assistance with feeding ?  ?Equipment Recommendations ? Wheelchair (measurements PT);Wheelchair cushion (measurements PT);BSC/3in1;Other (comment)  ?  ?Recommendations for Other Services   ? ? ?  ?Precautions / Restrictions Precautions ?Precautions: Fall ?Precaution Comments: Pt pushes to the R ?Restrictions ?Weight Bearing Restrictions: No  ?  ? ?Mobility ? Bed Mobility ?Overal bed mobility: Needs Assistance ?Bed Mobility: Supine to Sit, Sit to Supine ?  ?  ?Supine to sit: Max assist ?Sit to supine: Max assist ?  ?General bed mobility comments: pt requires max assist to mobilize trunk, R LE, and UE. He was unable to lay down to his due to his R inattention. ?  ? ?Transfers ?  ?  ?  ?  ?  ?  ?  ?  ?  ?  ?  ? ?Ambulation/Gait ?  ?  ?  ?  ?  ?  ?  ?  ? ? ?Stairs ?  ?  ?  ?  ?  ? ? ?Wheelchair Mobility ?  ? ?Modified Rankin (Stroke Patients Only) ?Modified Rankin (Stroke Patients Only) ?Pre-Morbid Rankin Score: No significant disability ?Modified Rankin: Severe disability ? ? ?  ?Balance Overall balance assessment: Needs assistance ?Sitting-balance support: Single extremity supported, Feet supported ?Sitting balance-Leahy Scale: Poor ?Sitting balance - Comments: max assist initially, improves to min guard at times. Intermittent pushing to right will use left hand to support himself as he pushes to the right. Pt had improved sitting balance  sitting in front of the mirror today but became distracted by his teeth and his facial hair. ?Postural control: Right lateral lean, Posterior lean ?Standing balance support: Single extremity supported ?  ?  ?  ?  ?  ?  ?   ?  ?  ?  ?  ?  ?  ?  ? ?  ?Cognition Arousal/Alertness: Awake/alert ?Behavior During Therapy: Agitated ?Overall Cognitive Status: Impaired/Different from baseline ?Area of Impairment: Problem solving, Attention, Awareness ?  ?  ?  ?  ?  ?  ?  ?  ?  ?Current Attention Level: Sustained ?  ?Following Commands: Follows one step commands inconsistently, Follows one step commands with increased time ?Safety/Judgement: Decreased awareness of safety, Decreased awareness of deficits ?Awareness: Intellectual ?Problem Solving: Slow processing, Requires verbal cues, Requires tactile cues ?General Comments: Pt requires repeated simple cuing for tasks. He is unable to squence complex tasks. He continues to exhibit R inattention. ?  ?  ? ?  ?Exercises   ? ?  ?General Comments General comments (skin integrity, edema, etc.): Pt was able to perform oral hygiene with suction, wipe his face and mouth with a wash cloth, and put chapstick on his lips with simple, step by step cuing. ?  ?  ? ?Pertinent Vitals/Pain Pain Assessment ?Pain Assessment: No/denies pain  ? ? ?Home Living   ?  ?  ?  ?  ?  ?  ?  ?  ?  ?   ?  ?Prior Function    ?  ?  ?   ? ?PT Goals (current goals can now be found in the care plan section)   ? ?  ?Frequency ? ? ? Min 3X/week ? ? ? ?  ?PT Plan Current plan remains appropriate  ? ? ?Co-evaluation   ?  ?  ?  ?  ? ?  ?AM-PAC PT "6 Clicks" Mobility   ?Outcome Measure ? Help needed turning from your back to your side while in a flat bed without using bedrails?: A Lot ?Help needed moving from lying on your back to sitting on the side of a flat bed without using bedrails?: A Lot ?Help needed moving to and from a bed to a chair (including a wheelchair)?: Total ?Help needed standing up from a chair using your arms (e.g., wheelchair or bedside chair)?: A Lot ?Help needed to walk in hospital room?: Total ?Help needed climbing 3-5 steps with a railing? : Total ?6 Click Score: 9 ? ?  ?End of Session   ?Activity Tolerance:  Patient tolerated treatment well ?Patient left: in bed;with bed alarm set;with call bell/phone within reach ?  ?PT Visit Diagnosis: Other abnormalities of gait and mobility (R26.89);Muscle weakness (generalized) (M62.81);Other symptoms and signs involving the nervous system (R29.898);Hemiplegia and hemiparesis ?Hemiplegia - Right/Left: Right ?Hemiplegia - dominant/non-dominant: Dominant ?Hemiplegia - caused by: Nontraumatic intracerebral hemorrhage ?  ? ? ?Time: 8413-2440 ?PT Time Calculation (min) (ACUTE ONLY): 33 min ? ?Charges:  $Therapeutic Activity: 8-22 mins ?$Neuromuscular Re-education: 8-22 mins          ?          ? ?Armanda Heritage, SPT ? ? ? ?Armanda Heritage ?09/25/2021, 5:57 PM ? ?

## 2021-09-25 NOTE — TOC Progression Note (Signed)
Transition of Care (TOC) - Progression Note  ? ? ?Patient Details  ?Name: Joshua Price ?MRN: [id number] ?Date of Birth: 12-23-1960 ? ?Transition of Care (TOC) CM/SW Contact  ?Vinie Sill, LCSW ?Phone Number: ?09/25/2021, 4:56 PM ? ?Clinical Narrative:    ? ?Call patient's mother 3:46 pm & 4:39pm to complete assessment- received no answer ? ?TOC will continue to follow and assist with discharge planning. ? ?Thurmond Butts, MSW, LCSW ?Clinical Social Worker ? ? ? ?  ?  ? ?Expected Discharge Plan and Services ?  ?  ?  ?  ?  ?                ?  ?  ?  ?  ?  ?  ?  ?  ?  ?  ? ? ?Social Determinants of Health (SDOH) Interventions ?  ? ?Readmission Risk Interventions ?No flowsheet data found. ? ?

## 2021-09-25 NOTE — Plan of Care (Signed)
?  Problem: Education: ?Goal: Knowledge of disease or condition will improve ?Outcome: Not Progressing ?Goal: Knowledge of secondary prevention will improve (SELECT ALL) ?Outcome: Not Progressing ?Goal: Knowledge of patient specific risk factors will improve (INDIVIDUALIZE FOR PATIENT) ?Outcome: Not Progressing ?Goal: Individualized Educational Video(s) ?Outcome: Not Progressing ?  ?Problem: Coping: ?Goal: Will identify appropriate support needs ?Outcome: Not Progressing ?  ?Problem: Health Behavior/Discharge Planning: ?Goal: Ability to manage health-related needs will improve ?Outcome: Not Progressing ?  ?Problem: Self-Care: ?Goal: Ability to participate in self-care as condition permits will improve ?Outcome: Not Progressing ?Goal: Verbalization of feelings and concerns over difficulty with self-care will improve ?Outcome: Not Progressing ?Goal: Ability to communicate needs accurately will improve ?Outcome: Not Progressing ?  ?Problem: Education: ?Goal: Knowledge of General Education information will improve ?Description: Including pain rating scale, medication(s)/side effects and non-pharmacologic comfort measures ?Outcome: Not Progressing ?  ?Problem: Health Behavior/Discharge Planning: ?Goal: Ability to manage health-related needs will improve ?Outcome: Not Progressing ?  ?Problem: Activity: ?Goal: Risk for activity intolerance will decrease ?Outcome: Not Progressing ?  ?Problem: Coping: ?Goal: Level of anxiety will decrease ?Outcome: Not Progressing ?  ?Problem: Nutrition: ?Goal: Risk of aspiration will decrease ?Outcome: Progressing ?Goal: Dietary intake will improve ?Outcome: Progressing ?  ?Problem: Intracerebral Hemorrhage Tissue Perfusion: ?Goal: Complications of Intracerebral Hemorrhage will be minimized ?Outcome: Progressing ?  ?Problem: Safety: ?Goal: Non-violent Restraint(s) ?Outcome: Progressing ?  ?Problem: Clinical Measurements: ?Goal: Ability to maintain clinical measurements within normal limits  will improve ?Outcome: Progressing ?Goal: Will remain free from infection ?Outcome: Progressing ?Goal: Diagnostic test results will improve ?Outcome: Progressing ?Goal: Respiratory complications will improve ?Outcome: Progressing ?Goal: Cardiovascular complication will be avoided ?Outcome: Progressing ?  ?Problem: Nutrition: ?Goal: Adequate nutrition will be maintained ?Outcome: Progressing ?  ?Problem: Elimination: ?Goal: Will not experience complications related to bowel motility ?Outcome: Progressing ?Goal: Will not experience complications related to urinary retention ?Outcome: Progressing ?  ?Problem: Pain Managment: ?Goal: General experience of comfort will improve ?Outcome: Progressing ?  ?Problem: Safety: ?Goal: Ability to remain free from injury will improve ?Outcome: Progressing ?  ?Problem: Skin Integrity: ?Goal: Risk for impaired skin integrity will decrease ?Outcome: Progressing ?  ?

## 2021-09-25 NOTE — Progress Notes (Signed)
Occupational Therapy Treatment ?Patient Details ?Name: Joshua Price ?MRN: [id number] ?DOB: 1960/12/11 ?Today's Date: 09/25/2021 ? ? ?History of present illness 61 yo male presents to [facility] on 2/25 with fall at home, + ETOH, BP 218/134. CT head reveals acute parenchymal hemorrhage involving left lentiform nucleus and  surrounding white matter; repeat CT shows interval increase in side of intraparenchymal hematoma centered in L BG, increased edema and 72mm L-to-R midline shift. PMH includes HTN, smoker, esophageal varices. ?  ?OT comments ? Patient able to sit at edge of bed with up to Max A for bed mobility, and initially pushing to the right.  Left hand placed in the patient's lap, and increasing balance support needed to combat posterior and lateral leaning.  Patient with some spontaneous looking to the R, a lot of cues to pick his head up.  The patient with hunched posture in sitting.  Patient returned to supine with near total assist.  Attempted to work on grooming seated in bed with objects placed in his right visual field, Min A for thoroughness, and swab offered and used for oral care.  OT will continue efforts in the acute setting.  Discharge disposition is unclear, but SNF for potential LTC and attempted post acute rehab recommended.    ? ?Recommendations for follow up therapy are one component of a multi-disciplinary discharge planning process, led by the attending physician.  Recommendations may be updated based on patient status, additional functional criteria and insurance authorization. ?   ?Follow Up Recommendations ? Skilled nursing-short term rehab (<3 hours/day)  ?  ?Assistance Recommended at Discharge Frequent or constant Supervision/Assistance  ?Patient can return home with the following ? Two people to help with walking and/or transfers;Two people to help with bathing/dressing/bathroom;Direct supervision/assist for medications management;Direct supervision/assist for financial management;Assistance  with cooking/housework;Assist for transportation;Help with stairs or ramp for entrance;Assistance with feeding ?  ?Equipment Recommendations ?    ?  ?Recommendations for Other Services   ? ?  ?Precautions / Restrictions Precautions ?Precautions: Fall ?Precaution Comments: Pt pushes to the R ?Restrictions ?Weight Bearing Restrictions: No  ? ? ?  ? ?Mobility Bed Mobility ?Overal bed mobility: Needs Assistance ?  ?  ?Sidelying to sit: Max assist, HOB elevated ?  ?Sit to supine: Total assist ?  ?  ?  ? ?Transfers ?  ?  ?  ?  ?  ?  ?  ?  ?  ?General transfer comment: Patient continues with the R lateral lean, Left hand placed in lap, with increased right lean initially. ?  ?  ?Balance Overall balance assessment: Needs assistance ?Sitting-balance support: Single extremity supported, No upper extremity supported ?Sitting balance-Leahy Scale: Poor ?  ?  ?  ?  ?  ?  ?  ?  ?  ?  ?  ?  ?  ?  ?  ?  ?   ? ?ADL either performed or assessed with clinical judgement  ? ?ADL   ?  ?  ?Grooming: Wash/dry face;Minimal assistance ?  ?  ?  ?  ?  ?  ?  ?  ?  ?  ?  ?  ?  ?  ?  ?  ?  ?  ? ?Extremity/Trunk Assessment Upper Extremity Assessment ?Upper Extremity Assessment: RUE deficits/detail ?RUE Deficits / Details: flaccid--PROM WNL ?RUE Sensation: decreased light touch ?RUE Coordination: decreased fine motor;decreased gross motor ?  ?Lower Extremity Assessment ?Lower Extremity Assessment: Defer to PT evaluation ?  ?  ?  ? ?Vision   ?  Additional Comments: Continues to improve with tracking to the R ?  ?Perception Perception ?Perception: Impaired ?Inattention/Neglect: Does not attend to right visual field ?  ?Praxis   ?  ? ?Cognition Arousal/Alertness: Awake/alert ?Behavior During Therapy: [facility] for tasks assessed/performed ?Overall Cognitive Status: Impaired/Different from baseline ?  ?  ?  ?  ?  ?  ?  ?  ?  ?  ?  ?  ?Following Commands: Follows one step commands inconsistently, Follows one step commands with increased time ?  ?  ?Problem  Solving: Slow processing, Requires verbal cues, Requires tactile cues ?  ?  ?  ?   ?Exercises General Exercises - Upper Extremity ?Shoulder Flexion: PROM, Right, Supine ?Elbow Flexion: PROM, Right ?Elbow Extension: PROM, Right ?Wrist Flexion: PROM, Right ?Wrist Extension: PROM, Right ?Digit Composite Flexion: PROM, Right ?Composite Extension: PROM, Right ? ?  ?   ? ? ?  ?    ? ? ?Pertinent Vitals/ Pain       Pain Assessment ?Pain Assessment: Faces ?Faces Pain Scale: No hurt ?Pain Intervention(s): Monitored during session ? ?   ?  ?  ?  ?  ?  ?  ?  ?  ?  ?  ?  ?  ?  ?  ?  ?  ?  ?  ? ?  ?    ?  ?  ?  ?   ? ?Frequency ? Min 2X/week  ? ? ? ? ?  ?Progress Toward Goals ? ?OT Goals(current goals can now be found in the care plan section) ?   ? ?Acute Rehab OT Goals ?OT Goal Formulation: Patient unable to participate in goal setting ?Time For Goal Achievement: 10/06/21 ?Potential to Achieve Goals: Fair  ?Plan Discharge plan remains appropriate;Frequency remains appropriate   ? ?Co-evaluation ? ? ?   ?  ?  ?  ?  ? ?  ?AM-PAC OT "6 Clicks" Daily Activity     ?Outcome Measure ? ? Help from another person eating meals?: A Lot ?Help from another person taking care of personal grooming?: A Lot ?Help from another person toileting, which includes using toliet, bedpan, or urinal?: A Lot ?Help from another person bathing (including washing, rinsing, drying)?: A Lot ?Help from another person to put on and taking off regular upper body clothing?: A Lot ?Help from another person to put on and taking off regular lower body clothing?: Total ?6 Click Score: 11 ? ?  ?End of Session   ? ?OT Visit Diagnosis: Unsteadiness on feet (R26.81);Other abnormalities of gait and mobility (R26.89);Muscle weakness (generalized) (M62.81);Low vision, both eyes (H54.2);Other symptoms and signs involving cognitive function;Hemiplegia and hemiparesis;Cognitive communication deficit (R41.841) ?Symptoms and signs involving cognitive functions: Nontraumatic  intracerebral hemorrhage ?Hemiplegia - Right/Left: Right ?Hemiplegia - dominant/non-dominant: Dominant ?Hemiplegia - caused by: Nontraumatic intracerebral hemorrhage ?  ?Activity Tolerance Patient tolerated treatment well ?  ?Patient Left in bed;with call bell/phone within reach;with bed alarm set ?  ?Nurse Communication Other (comment) ?  ? ?   ? ?Time: 7681-1572 ?OT Time Calculation (min): 21 min ? ?Charges: OT General Charges ?$OT Visit: 1 Visit ?OT Treatments ?$Therapeutic Activity: 8-22 mins ? ?09/25/2021 ? ?RP, OTR/L ? ?Acute Rehabilitation Services ? ?Office:  [phone] ? ? ?Catelin Manthe D Preet Perrier ?09/25/2021, 11:30 AM ?

## 2021-09-26 LAB — GLUCOSE, CAPILLARY
Glucose-Capillary: 106 mg/dL — ABNORMAL HIGH (ref 70–99)
Glucose-Capillary: 130 mg/dL — ABNORMAL HIGH (ref 70–99)
Glucose-Capillary: 157 mg/dL — ABNORMAL HIGH (ref 70–99)
Glucose-Capillary: 116 mg/dL — ABNORMAL HIGH (ref 70–99)

## 2021-09-26 LAB — BASIC METABOLIC PANEL
Anion gap: 10 (ref 5–15)
BUN: 11 mg/dL (ref 6–20)
CO2: 22 mmol/L (ref 22–32)
Calcium: 9.2 mg/dL (ref 8.9–10.3)
Chloride: 106 mmol/L (ref 98–111)
Creatinine, Ser: 0.74 mg/dL (ref 0.61–1.24)
GFR, Estimated: >60 mL/min (ref >60)
Glucose, Bld: 108 mg/dL — ABNORMAL HIGH (ref 70–99)
Potassium: 3.5 mmol/L (ref 3.5–5.1)
Sodium: 138 mmol/L (ref 135–145)

## 2021-09-26 NOTE — Progress Notes (Incomplete)
Pt. Has been very irritable tonight, does not want to be bothered, rolls eyes & mumbles as I walk into the room. Does not want tempeture taken, not compliant with taking medications, was able to get him to allow me to administer all of his 2200 medications before he stared to get agitated. Will continue care  ?

## 2021-09-26 NOTE — Progress Notes (Signed)
?PROGRESS NOTE ? ?Joshua Price QMG:[id number] DOB: 11/14/1960 DOA: 09/05/2021 ?PCP: Dartha Lodge, FNP ? ? LOS: 21 days  ? ?Brief Narrative / Interim history: ?61 y.o. male with history of HTN, smoking, esophageal varcies presenting after he was found down on the concrete porch of his home.  He has been on the phone speaking with a relative and she heard him fall and he was able to state that he was on the ground and could not get up family member stated that this happened at 1645 on 2/25 and the son was able to get there within 5 minutes, he then called EMS.  On arrival he had decreased alertness, right hemineglect, dysarthric speech.  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  He was admitted to the neuro ICU.  CT scans were repeated which showed stable findings.   Stay complicated by hypernatremia and aspiration pna.  Continues to have significant dysarthria.  Placement issue.  Plan for skilled nursing facility placement.  TOC following ? ?Subjective / 24h Interval events: ?Appears comfortable.  Minimally verbal, does not answer my questions ? ?Assesement and Plan: ?Principal Problem: ?  ICH (intracerebral hemorrhage) (HCC) ?Active Problems: ?  Hypernatremia ?  Aspiration pneumonia (HCC) ?  Essential hypertension ?  Altered mental status ?  Alcohol withdrawal (HCC) ?  Oropharyngeal dysphagia ?  Malnutrition of moderate degree ?  Hypokalemia ?  Hyperglycemia ? ? 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  Cerebral edema was noted on imaging studies.  He was initially admitted to the neuro ICU and started on hypertonic saline.  Repeat CT scan showed stability and he was eventually transferred to the hospitalist service.  He has right hemiplegia, severe dysarthria which seem to be persistent.  2D echo showed normal  systolic function, grade 1 diastolic dysfunction.  Prognosis is quite poor.  Goals of care was discussed with family by palliative care.  Remains full code, continue full scope of treatment for now.  Therapies recommending SNF, placement pending, Medicaid pending ? ?Active problems ?Aspiration pneumonia (HCC) -had a febrile episode on 3/3, chest x-ray showed a left lower lobe infiltrate.  Completed a course of Augmentin. ? ?Hypernatremia -resolved ? ?Altered mental status -sequelae of ICH, unclear recovery potential at this point ? ?Essential hypertension -Presented with hypertensive emergency.  Currently on amlodipine and metoprolol.  Blood pressure normal this morning ? ?Oropharyngeal dysphagia -initially on tube feeds, these were discontinued 3/9.  He is now on a dysphagia 1 diet, and he needs to be fed.  Aspiration precautions ? ?Alcohol withdrawal (HCC) -s/p CIWA protocol, resolved ? ?Malnutrition of moderate degree- Signs/Symptoms: mild fat depletion, moderate fat depletion, mild muscle depletion, severe muscle depletion ? ?Hypokalemia -Continue daily supplementation, monitor levels ? ?Hyperglycemia -Continue  SSI coverage, last hemoglobin A1c of 5.3 ? ?CBG (last 3)  ?Recent Labs  ?  09/25/21 ?1607 09/25/21 ?2106 09/26/21 ?0734  ?GLUCAP 150* 143* 106*  ? ? ?Scheduled Meds: ? amLODipine  10 mg Oral Daily  ? vitamin C  500 mg Oral BID  ? chlorhexidine  15 mL Mouth Rinse BID  ? Chlorhexidine Gluconate Cloth  6 each Topical Daily  ? cholecalciferol  1,000 Units Oral Daily  ? folic acid  1 mg Oral Daily  ? heparin injection (subcutaneous)  5,000 Units Subcutaneous Q8H  ? insulin aspart  0-15 Units Subcutaneous TID  WC  ? insulin aspart  0-5 Units Subcutaneous QHS  ? mouth rinse  15 mL Mouth Rinse q12n4p  ? metoprolol tartrate  25 mg Oral BID  ? multivitamin with minerals  1 tablet Oral Daily  ? nystatin  5 mL Oral QID  ? pantoprazole  40 mg Oral QHS  ? potassium chloride  40 mEq Oral Daily  ? thiamine  100 mg Oral  Daily  ? ?Continuous Infusions: ?PRN Meds:.acetaminophen **OR** acetaminophen (TYLENOL) oral liquid 160 mg/5 mL **OR** acetaminophen, hydrALAZINE, labetalol, lip balm, polyethylene glycol ? ?Diet Orders (From admission, onward)  ? ?  Start     Ordered  ? 09/09/21 1418  DIET - DYS 1 Room service appropriate? Yes; Fluid consistency: Nectar Thick  Diet effective now       ?Question Answer Comment  ?Room service appropriate? Yes   ?Fluid consistency: Nectar Thick   ?  ? 09/09/21 1417  ? ?  ?  ? ?  ? ? ?DVT prophylaxis: heparin injection 5,000 Units Start: 09/12/21 2200 ?SCD's Start: 09/05/21 1942 ? ? ?Lab Results  ?Component Value Date  ? PLT 268 09/23/2021  ? ? ?  Code Status: Full Code ? ?Family Communication: No family at bedside ? ?Status is: Inpatient ? ?Remains inpatient appropriate because: waiting for safe discharge avenue ? ?Level of care: Progressive ? ?Microbiology  ?none ? ?Antimicrobials: ?none  ? ? ?Objective: ?Vitals:  ? 09/25/21 2302 09/26/21 0315 09/26/21 0500 09/26/21 0735  ?BP: 127/86 129/83  135/83  ?Pulse: 89 79  80  ?Resp: 17 12  20   ?Temp: 99.1 ?F (37.3 ?C) 99 ?F (37.2 ?C)  98.6 ?F (37 ?C)  ?TempSrc: Axillary Oral  Oral  ?SpO2: 98% 98%  97%  ?Weight:   75.1 kg   ?Height:      ? ? ?Intake/Output Summary (Last 24 hours) at 09/26/2021 0955 ?Last data filed at 09/26/2021 0515 ?Gross per 24 hour  ?Intake 840 ml  ?Output 1400 ml  ?Net -560 ml  ? ?Wt Readings from Last 3 Encounters:  ?09/26/21 75.1 kg  ?07/05/17 65.8 kg  ?02/24/15 81.2 kg  ? ? ?Examination: ? ?Constitutional: NAD ?Eyes: no scleral icterus ?ENMT: Mucous membranes are moist.  ?Neck: normal, supple ?Respiratory: clear to auscultation bilaterally, no wheezing, no crackles. Normal respiratory effort. ?Cardiovascular: Regular rate and rhythm, no murmurs / rubs / gallops. No LE edema.  ?Abdomen: non distended, no tenderness. Bowel sounds positive.  ?Musculoskeletal: no clubbing / cyanosis.  ?Skin: no rashes ?Neurologic: Right-sided  weakness ? ?Data Reviewed: I have independently reviewed following labs and imaging studies  ? ?CBC ?Recent Labs  ?Lab 09/20/21 ?0149 09/21/21 ?0823 09/22/21 ?[id number] 09/23/21 ?[id number]  ?WBC 8.4 8.2 7.4 7.0  ?HGB 12.4* 12.6* 11.7* 10.6*  ?HCT 37.3* 38.7* 35.4* 32.9*  ?PLT 311 309 231 268  ?MCV 100.0 101.6* 98.9 97.3  ?MCH 33.2 33.1 32.7 31.4  ?MCHC 33.2 32.6 33.1 32.2  ?RDW 13.2 13.2 12.8 12.6  ? ? ?Recent Labs  ?Lab 09/21/21 ?0823 09/22/21 ?[id number] 09/23/21 ?[id number] 09/25/21 ?0059 09/26/21 ?[id number]  ?NA 151* 146* 140 139 138  ?K 3.6 3.6 3.5 3.2* 3.5  ?CL 116* 115* 107 107 106  ?CO2 26 23 21* 23 22  ?GLUCOSE 137* 112* 103* 98 108*  ?BUN 22* 20 15 12 11   ?CREATININE 1.06 0.91 0.81 0.79 0.74  ?CALCIUM 9.9 9.1 8.6* 9.0 9.2  ? ? ?------------------------------------------------------------------------------------------------------------------ ?No results for input(s): CHOL, HDL, LDLCALC, TRIG, CHOLHDL, LDLDIRECT in the last 72 hours. ? ?  Lab Results  ?Component Value Date  ? HGBA1C 5.3 09/07/2021  ? ?------------------------------------------------------------------------------------------------------------------ ?No results for input(s): TSH, T4TOTAL, T3FREE, THYROIDAB in the last 72 hours. ? ?Invalid input(s): FREET3 ? ?Cardiac Enzymes ?No results for input(s): CKMB, TROPONINI, MYOGLOBIN in the last 168 hours. ? ?Invalid input(s): CK ?------------------------------------------------------------------------------------------------------------------ ?No results found for: BNP ? ?CBG: ?Recent Labs  ?Lab 09/25/21 ?0736 09/25/21 ?1154 09/25/21 ?1607 09/25/21 ?2106 09/26/21 ?0734  ?GLUCAP 101* 112* 150* 143* 106*  ? ? ?No results found for this or any previous visit (from the past 240 hour(s)).  ? ?Radiology Studies: ?No results found. ? ? ?Pamella Pert, MD, PhD ?[facility] ? ?Between 7 am - 7 pm I am available, please contact me via Amion (for emergencies) or Securechat (non urgent messages) ? ?Between 7 pm - 7 am I am not  available, please contact night coverage MD/APP via Amion ? ?

## 2021-09-26 NOTE — Plan of Care (Signed)
?  Problem: Intracerebral Hemorrhage Tissue Perfusion: ?Goal: Complications of Intracerebral Hemorrhage will be minimized ?Outcome: Progressing ?  ?Problem: Safety: ?Goal: Non-violent Restraint(s) ?Outcome: Progressing ?  ?Problem: Education: ?Goal: Knowledge of disease or condition will improve ?Outcome: Not Progressing ?Goal: Knowledge of secondary prevention will improve (SELECT ALL) ?Outcome: Not Progressing ?Goal: Knowledge of patient specific risk factors will improve (INDIVIDUALIZE FOR PATIENT) ?Outcome: Not Progressing ?Goal: Individualized Educational Video(s) ?Outcome: Not Progressing ?  ?Problem: Coping: ?Goal: Will identify appropriate support needs ?Outcome: Not Progressing ?  ?Problem: Health Behavior/Discharge Planning: ?Goal: Ability to manage health-related needs will improve ?Outcome: Not Progressing ?  ?Problem: Self-Care: ?Goal: Ability to participate in self-care as condition permits will improve ?Outcome: Not Progressing ?Goal: Verbalization of feelings and concerns over difficulty with self-care will improve ?Outcome: Not Progressing ?Goal: Ability to communicate needs accurately will improve ?Outcome: Not Progressing ?  ?Problem: Nutrition: ?Goal: Risk of aspiration will decrease ?Outcome: Not Progressing ?Goal: Dietary intake will improve ?Outcome: Not Progressing ?  ?

## 2021-09-27 DIAGNOSIS — F10930 Alcohol use, unspecified with withdrawal, uncomplicated: Secondary | ICD-10-CM

## 2021-09-27 LAB — CBC
HCT: 34.1 % — ABNORMAL LOW (ref 39.0–52.0)
Hemoglobin: 11.3 g/dL — ABNORMAL LOW (ref 13.0–17.0)
MCH: 31.5 pg (ref 26.0–34.0)
MCHC: 33.1 g/dL (ref 30.0–36.0)
MCV: 95 fL (ref 80.0–100.0)
Platelets: 278 10*3/uL (ref 150–400)
RBC: 3.59 MIL/uL — ABNORMAL LOW (ref 4.22–5.81)
RDW: 12.5 % (ref 11.5–15.5)
WBC: 7.8 10*3/uL (ref 4.0–10.5)
nRBC: 0 % (ref 0.0–0.2)

## 2021-09-27 LAB — COMPREHENSIVE METABOLIC PANEL
ALT: 51 U/L — ABNORMAL HIGH (ref 0–44)
AST: 65 U/L — ABNORMAL HIGH (ref 15–41)
Albumin: 2.5 g/dL — ABNORMAL LOW (ref 3.5–5.0)
Alkaline Phosphatase: 142 U/L — ABNORMAL HIGH (ref 38–126)
Anion gap: 10 (ref 5–15)
BUN: 11 mg/dL (ref 6–20)
CO2: 23 mmol/L (ref 22–32)
Calcium: 9.4 mg/dL (ref 8.9–10.3)
Chloride: 106 mmol/L (ref 98–111)
Creatinine, Ser: 0.76 mg/dL (ref 0.61–1.24)
GFR, Estimated: >60 mL/min (ref >60)
Glucose, Bld: 115 mg/dL — ABNORMAL HIGH (ref 70–99)
Potassium: 3.6 mmol/L (ref 3.5–5.1)
Sodium: 139 mmol/L (ref 135–145)
Total Bilirubin: 0.8 mg/dL (ref 0.3–1.2)
Total Protein: 8.1 g/dL (ref 6.5–8.1)

## 2021-09-27 LAB — GLUCOSE, CAPILLARY
Glucose-Capillary: 119 mg/dL — ABNORMAL HIGH (ref 70–99)
Glucose-Capillary: 119 mg/dL — ABNORMAL HIGH (ref 70–99)
Glucose-Capillary: 154 mg/dL — ABNORMAL HIGH (ref 70–99)
Glucose-Capillary: 178 mg/dL — ABNORMAL HIGH (ref 70–99)

## 2021-09-27 NOTE — Progress Notes (Signed)
?PROGRESS NOTE ? ?Joshua Price [id number] DOB: [birth year] DOA: 09/05/2021 ?PCP: Dartha LodgeSteele, Anthony, FNP ? ? LOS: 22 days  ? ?Brief Narrative / Interim history: ?61 y.o. male with history of HTN, smoking, esophageal varcies presenting after he was found down on the concrete porch of his home.  He has been on the phone speaking with a relative and she heard him fall and he was able to state that he was on the ground and could not get up family member stated that this happened at 1645 on 2/25 and the son was able to get there within 5 minutes, he then called EMS.  On arrival he had decreased alertness, right hemineglect, dysarthric speech.  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  He was admitted to the neuro ICU.  CT scans were repeated which showed stable findings.   Stay complicated by hypernatremia and aspiration pna.  Continues to have significant dysarthria.  Placement issue.  Plan for skilled nursing facility placement.  TOC following ? ?Subjective / 24h Interval events: ?Comfortable, in bed, watching TV.  No complaints.  Minimally verbal ? ?Assesement and Plan: ?Principal Problem: ?  ICH (intracerebral hemorrhage) (HCC) ?Active Problems: ?  Hypernatremia ?  Aspiration pneumonia (HCC) ?  Essential hypertension ?  Altered mental status ?  Alcohol withdrawal (HCC) ?  Oropharyngeal dysphagia ?  Malnutrition of moderate degree ?  Hypokalemia ?  Hyperglycemia ? ? 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  Cerebral edema was noted on imaging studies.  He was initially admitted to the neuro ICU and started on hypertonic saline.  Repeat CT scan showed stability and he was eventually transferred to the hospitalist service.  He has right hemiplegia, severe dysarthria which seem to be persistent.  2D echo showed normal  systolic function, grade 1 diastolic dysfunction.  Prognosis is quite poor.  Goals of care was discussed with family by palliative care.  Remains full code, continue full scope of treatment for now.  Therapies recommending SNF, placement pending, Medicaid pending ? ?Active problems ?Aspiration pneumonia (HCC) -had a febrile episode on 3/3, chest x-ray showed a left lower lobe infiltrate.  Completed a course of Augmentin. ? ?Hypernatremia -resolved ? ?Altered mental status -sequelae of ICH, unclear recovery potential at this point ? ?Essential hypertension -Presented with hypertensive emergency.  Currently on amlodipine and metoprolol.  Blood pressure normal this morning ? ?Oropharyngeal dysphagia -initially on tube feeds, these were discontinued 3/9.  He is now on a dysphagia 1 diet, and he needs to be fed.  Aspiration precautions ? ?Alcohol withdrawal (HCC) -s/p CIWA protocol, resolved ? ?Malnutrition of moderate degree- Signs/Symptoms: mild fat depletion, moderate fat depletion, mild muscle depletion, severe muscle depletion ? ?Hypokalemia -Continue daily supplementation, monitor levels ? ?Hyperglycemia -Continue  SSI coverage, last hemoglobin A1c of 5.3 ? ?CBG (last 3)  ?Recent Labs  ?  09/26/21 ?1546 09/26/21 ?2134 09/27/21 ?[id number]  ?GLUCAP 157* 116* 119*  ? ? ? ?Scheduled Meds: ? amLODipine  10 mg Oral Daily  ? vitamin C  500 mg Oral BID  ? chlorhexidine  15 mL Mouth Rinse BID  ? Chlorhexidine Gluconate Cloth  6 each Topical Daily  ? cholecalciferol  1,000 Units Oral Daily  ? folic acid  1 mg Oral Daily  ? heparin injection (subcutaneous)  5,000 Units Subcutaneous Q8H  ? insulin aspart  0-15 Units  Subcutaneous TID WC  ? insulin aspart  0-5 Units Subcutaneous QHS  ? mouth rinse  15 mL Mouth Rinse q12n4p  ? metoprolol tartrate  25 mg Oral BID  ? multivitamin with minerals  1 tablet Oral Daily  ? nystatin  5 mL Oral QID  ? pantoprazole  40 mg Oral QHS  ? potassium chloride  40 mEq Oral Daily  ? thiamine  100 mg Oral  Daily  ? ?Continuous Infusions: ?PRN Meds:.acetaminophen **OR** acetaminophen (TYLENOL) oral liquid 160 mg/5 mL **OR** acetaminophen, hydrALAZINE, labetalol, lip balm, polyethylene glycol ? ?Diet Orders (From admission, onward)  ? ?  Start     Ordered  ? 09/09/21 1418  DIET - DYS 1 Room service appropriate? Yes; Fluid consistency: Nectar Thick  Diet effective now       ?Question Answer Comment  ?Room service appropriate? Yes   ?Fluid consistency: Nectar Thick   ?  ? 09/09/21 1417  ? ?  ?  ? ?  ? ? ?DVT prophylaxis: heparin injection 5,000 Units Start: 09/12/21 2200 ?SCD's Start: 09/05/21 1942 ? ? ?Lab Results  ?Component Value Date  ? PLT 278 09/27/2021  ? ? ?  Code Status: Full Code ? ?Family Communication: No family at bedside ? ?Status is: Inpatient ? ?Remains inpatient appropriate because: waiting for safe discharge avenue ? ?Level of care: Progressive ? ?Microbiology  ?none ? ?Antimicrobials: ?none  ? ? ?Objective: ?Vitals:  ? 09/26/21 2302 09/27/21 0300 09/27/21 0500 09/27/21 0700  ?BP: (!) 138/94 137/90  (!) 149/95  ?Pulse: 70 82  80  ?Resp: 18 18  12   ?Temp:    98.9 ?F (37.2 ?C)  ?TempSrc:    Oral  ?SpO2: 96%   95%  ?Weight:   74.9 kg   ?Height:      ? ? ?Intake/Output Summary (Last 24 hours) at 09/27/2021 1016 ?Last data filed at 09/26/2021 2335 ?Gross per 24 hour  ?Intake 840 ml  ?Output 1120 ml  ?Net -280 ml  ? ? ?Wt Readings from Last 3 Encounters:  ?09/27/21 74.9 kg  ?07/05/17 65.8 kg  ?02/24/15 81.2 kg  ? ? ?Examination: ? ?Constitutional: nad ?Respiratory: ctaclear to auscultation bilaterally, no wheezing, no crackles. Normal respiratory effort. ?Cardiovascular: rrr ?Neurologic: Right-sided weakness ? ?Data Reviewed: I have independently reviewed following labs and imaging studies  ? ?CBC ?Recent Labs  ?Lab 09/21/21 ?0823 09/22/21 ?09/24/21 09/23/21 ?09/25/21 09/27/21 ?0410  ?WBC 8.2 7.4 7.0 7.8  ?HGB 12.6* 11.7* 10.6* 11.3*  ?HCT 38.7* 35.4* 32.9* 34.1*  ?PLT 309 231 268 278  ?MCV 101.6* 98.9 97.3 95.0  ?MCH  33.1 32.7 31.4 31.5  ?MCHC 32.6 33.1 32.2 33.1  ?RDW 13.2 12.8 12.6 12.5  ? ? ? ?Recent Labs  ?Lab 09/22/21 ?09/24/21 09/23/21 ?09/25/21 09/25/21 ?0059 09/26/21 ?09/28/21 09/27/21 ?0410  ?NA 146* 140 139 138 139  ?K 3.6 3.5 3.2* 3.5 3.6  ?CL 115* 107 107 106 106  ?CO2 23 21* 23 22 23   ?GLUCOSE 112* 103* 98 108* 115*  ?BUN 20 15 12 11 11   ?CREATININE 0.91 0.81 0.79 0.74 0.76  ?CALCIUM 9.1 8.6* 9.0 9.2 9.4  ?AST  --   --   --   --  65*  ?ALT  --   --   --   --  51*  ?ALKPHOS  --   --   --   --  142*  ?BILITOT  --   --   --   --  0.8  ?ALBUMIN  --   --   --   --  2.5*  ? ? ? ?------------------------------------------------------------------------------------------------------------------ ?No results for input(s): CHOL, HDL, LDLCALC, TRIG, CHOLHDL, LDLDIRECT in the last 72 hours. ? ?Lab Results  ?Component Value Date  ? HGBA1C 5.3 09/07/2021  ? ?------------------------------------------------------------------------------------------------------------------ ?No results for input(s): TSH, T4TOTAL, T3FREE, THYROIDAB in the last 72 hours. ? ?Invalid input(s): FREET3 ? ?Cardiac Enzymes ?No results for input(s): CKMB, TROPONINI, MYOGLOBIN in the last 168 hours. ? ?Invalid input(s): CK ?------------------------------------------------------------------------------------------------------------------ ?No results found for: BNP ? ?CBG: ?Recent Labs  ?Lab 09/26/21 ?0734 09/26/21 ?1142 09/26/21 ?1546 09/26/21 ?2134 09/27/21 ?6269  ?GLUCAP 106* 130* 157* 116* 119*  ? ? ? ?No results found for this or any previous visit (from the past 240 hour(s)).  ? ?Radiology Studies: ?No results found. ? ? ?Pamella Pert, MD, PhD ?[facility] ? ?Between 7 am - 7 pm I am available, please contact me via Amion (for emergencies) or Securechat (non urgent messages) ? ?Between 7 pm - 7 am I am not available, please contact night coverage MD/APP via Amion ? ?

## 2021-09-28 LAB — GLUCOSE, CAPILLARY
Glucose-Capillary: 107 mg/dL — ABNORMAL HIGH (ref 70–99)
Glucose-Capillary: 122 mg/dL — ABNORMAL HIGH (ref 70–99)
Glucose-Capillary: 148 mg/dL — ABNORMAL HIGH (ref 70–99)
Glucose-Capillary: 142 mg/dL — ABNORMAL HIGH (ref 70–99)

## 2021-09-28 NOTE — Progress Notes (Signed)
Speech Language Pathology Treatment: Dysphagia  ?Patient Details ?Name: Joshua Price ?MRN: [id number] ?DOB: 04-30-1961 ?Today's Date: 09/28/2021 ?Time: 8891-6945 ?SLP Time Calculation (min) (ACUTE ONLY): 10 min ? ?Assessment / Plan / Recommendation ?Clinical Impression ? Pt consumed advanced trials of solids and liquids today, with intake marked by impulsivity especially with water. He requires hand-over-hand assist to slow his rate of intake today, although without overt s/s of aspiration noted. With small pieces of softened solids he has R-sided anterior loss and then buccal pocketing of almost all of the remaining bolus. Pt initially opened his mouth to allow SLP to monitor and try to clear pocketed food, but quickly appeared to become more frustrated and was no longer as cooperative with attempts at managing residuals. Ultimately, pt did clear his oral cavity with the use of a bite of applesauce. Would be hesitant to upgrade until he can consistently show more ability to orally control boluses given aphasia and reduced comprehension.  ?  ?HPI HPI: Patient is a 61 y.o. male with PMH: HTN, tobacco use, esophageal varices who presented to ED via EMS on 2/25 as a code stroke after being found down and unable to get up on concrete porch of his home. EMS noted right sided weakness and incoherent speech. In ED, stat CT revealed acute left basal ganglia hemorrhage. on 2/25, MRI head revealed appearance of increasing size of left basal ganglia intraparenchymal hematoma and slightly increasing mass effect on left lateral ventricle and 46mm of left to right midline shift. MD concerned that pt is not getting enough free water and sodium going up and wanted to repeat MBS. RN stated he drinks the nectar thick adequately. ?  ?   ?SLP Plan ? Continue with current plan of care ? ?  ?  ?Recommendations for follow up therapy are one component of a multi-disciplinary discharge planning process, led by the attending physician.   Recommendations may be updated based on patient status, additional functional criteria and insurance authorization. ?  ? ?Recommendations  ?Diet recommendations: Dysphagia 1 (puree);Nectar-thick liquid ?Liquids provided via: Cup;Straw ?Medication Administration: Crushed with puree ?Supervision: Staff to assist with self feeding;Full supervision/cueing for compensatory strategies ?Compensations: Slow rate;Small sips/bites;Lingual sweep for clearance of pocketing ?Postural Changes and/or Swallow Maneuvers: Seated upright 90 degrees  ?   ?    ?   ? ? ? ? Oral Care Recommendations: Oral care BID ?Follow Up Recommendations: Skilled nursing-short term rehab (<3 hours/day) ?Assistance recommended at discharge: Frequent or constant Supervision/Assistance ?SLP Visit Diagnosis: Dysphagia, oropharyngeal phase (R13.12);Aphasia (R47.01) ?Plan: Continue with current plan of care ? ? ? ? ?  ?  ? ? ?Mahala Menghini., M.A. CCC-SLP ?Acute Rehabilitation Services ?Pager [phone] ?Office [phone] ? ? ?09/28/2021, 1:04 PM ?

## 2021-09-28 NOTE — Progress Notes (Signed)
Physical Therapy Treatment ?Patient Details ?Name: Joshua Price ?MRN: [id number] ?DOB: April 10, 1961 ?Today's Date: 09/28/2021 ? ? ?History of Present Illness 61 yo male presents to [facility] on 2/25 with fall at home, + ETOH, BP 218/134. CT head reveals acute parenchymal hemorrhage involving left lentiform nucleus and  surrounding white matter; repeat CT shows interval increase in side of intraparenchymal hematoma centered in L BG, increased edema and 94mm L-to-R midline shift. PMH includes HTN, smoker, esophageal varices. ? ?  ?PT Comments  ? ? Pt initially agreeable to session, becomes more resistant and irritable when pt's brother leaves bedside and when mobility is initiated. Pt continues to demonstrate R inattention with hemiparesis, impaired dynamic sitting balance, and max difficulty performing bed-level mobility. Deficits appear to be exacerbated by aphasic changes, suspect pt becomes resistant to mobility when he does not understand max multimodal simple cues. SNF remains appropriate d/c.  ? ?   ?Recommendations for follow up therapy are one component of a multi-disciplinary discharge planning process, led by the attending physician.  Recommendations may be updated based on patient status, additional functional criteria and insurance authorization. ? ?Follow Up Recommendations ? Skilled nursing-short term rehab (<3 hours/day) ?  ?  ?Assistance Recommended at Discharge Frequent or constant Supervision/Assistance  ?Patient can return home with the following Two people to help with walking and/or transfers;Two people to help with bathing/dressing/bathroom;Direct supervision/assist for medications management;Direct supervision/assist for financial management;Assist for transportation;Help with stairs or ramp for entrance;Assistance with cooking/housework;Assistance with feeding ?  ?Equipment Recommendations ? Wheelchair (measurements PT);Wheelchair cushion (measurements PT);BSC/3in1;Other (comment)  ?  ?Recommendations  for Other Services   ? ? ?  ?Precautions / Restrictions Precautions ?Precautions: Fall ?Restrictions ?Weight Bearing Restrictions: No  ?  ? ?Mobility ? Bed Mobility ?Overal bed mobility: Needs Assistance ?Bed Mobility: Supine to Sit, Rolling, Sit to Supine ?Rolling: Mod assist, +2 for physical assistance ?  ?Supine to sit: Max assist, HOB elevated ?Sit to supine: Max assist, HOB elevated, +2 for physical assistance ?  ?General bed mobility comments: mod-max +2 for rolling bilat for truncal translation and LE mobility, step-by-step sequencing cues and pt more resistant to roll R. Max +2 for supine<>sit for LE and truncal management, increased assist for supine>sit for sequencing cues. ?  ? ?Transfers ?  ?  ?Transfers: Bed to chair/wheelchair/BSC ?  ?  ?  ?  ?  ? Lateral/Scoot Transfers: Max assist ?General transfer comment: mod assist for scoot towards L for truncal translation, once initiated pt able to assist with LUE and LLE push up. ?  ? ?Ambulation/Gait ?  ?  ?  ?  ?  ?  ?  ?General Gait Details: nt ? ? ?Stairs ?  ?  ?  ?  ?  ? ? ?Wheelchair Mobility ?  ? ?Modified Rankin (Stroke Patients Only) ?Modified Rankin (Stroke Patients Only) ?Pre-Morbid Rankin Score: No significant disability ?Modified Rankin: Severe disability ? ? ?  ?Balance Overall balance assessment: Needs assistance ?Sitting-balance support: Single extremity supported, Feet supported ?Sitting balance-Leahy Scale: Fair ?Sitting balance - Comments: periods of supervision when sitting statically, R lateral and posterior leaning with LE dynamic movement ?Postural control: Right lateral lean, Posterior lean ?  ?  ?  ?  ?  ?  ?  ?  ?  ?  ?  ?  ?  ?  ?  ? ?  ?Cognition Arousal/Alertness: Awake/alert ?Behavior During Therapy: Agitated, Restless ?Overall Cognitive Status: Impaired/Different from baseline ?Area of Impairment: Problem solving, Attention, Awareness ?  ?  ?  ?  ?  ?  ?  ?  ?  ?  Current Attention Level: Sustained ?  ?Following Commands: Follows  one step commands inconsistently, Follows one step commands with increased time ?Safety/Judgement: Decreased awareness of safety, Decreased awareness of deficits ?Awareness: Intellectual ?Problem Solving: Slow processing, Requires verbal cues, Requires tactile cues, Difficulty sequencing ?General Comments: Pt irritable and becomes resistant to mobility with moving towards R, suspect given perceptual deficits of R inattention and aphasia. Pt makes frustrating-sounding statements throughout session, difficult to discern what pt is saying given aphasia ?  ?  ? ?  ?Exercises General Exercises - Lower Extremity ?Long Arc Quad: AAROM, Left, PROM, Right, 10 reps, Seated ?Heel Slides: PROM, Right, 10 reps, Supine (with LLE moving in concordance with RLE nonvolitionally) ? ?  ?General Comments   ?  ?  ? ?Pertinent Vitals/Pain Pain Assessment ?Pain Assessment: Faces ?Faces Pain Scale: No hurt ?Pain Intervention(s): Limited activity within patient's tolerance, Monitored during session  ? ? ?Home Living   ?  ?  ?  ?  ?  ?  ?  ?  ?  ?   ?  ?Prior Function    ?  ?  ?   ? ?PT Goals (current goals can now be found in the care plan section) Acute Rehab PT Goals ?Patient Stated Goal: unable to state ?PT Goal Formulation: Patient unable to participate in goal setting ?Time For Goal Achievement: 10/05/21 ?Potential to Achieve Goals: Good ?Progress towards PT goals: Progressing toward goals ? ?  ?Frequency ? ? ? Min 3X/week ? ? ? ?  ?PT Plan Current plan remains appropriate  ? ? ?Co-evaluation   ?Reason for Co-Treatment: For patient/therapist safety ?  ?  ?  ? ?  ?AM-PAC PT "6 Clicks" Mobility   ?Outcome Measure ? Help needed turning from your back to your side while in a flat bed without using bedrails?: A Lot ?Help needed moving from lying on your back to sitting on the side of a flat bed without using bedrails?: Total ?Help needed moving to and from a bed to a chair (including a wheelchair)?: Total ?Help needed standing up from a  chair using your arms (e.g., wheelchair or bedside chair)?: Total ?Help needed to walk in hospital room?: Total ?Help needed climbing 3-5 steps with a railing? : Total ?6 Click Score: 7 ? ?  ?End of Session   ?Activity Tolerance: Other (comment);Patient limited by fatigue (limited by pt irritability) ?Patient left: in bed;with bed alarm set;with call bell/phone within reach ?Nurse Communication: Mobility status;Other (comment) (needs new primofit) ?PT Visit Diagnosis: Other abnormalities of gait and mobility (R26.89);Muscle weakness (generalized) (M62.81);Other symptoms and signs involving the nervous system (R29.898);Hemiplegia and hemiparesis ?Hemiplegia - Right/Left: Right ?Hemiplegia - dominant/non-dominant: Dominant ?Hemiplegia - caused by: Nontraumatic intracerebral hemorrhage ?  ? ? ?Time: 4680-3212 ?PT Time Calculation (min) (ACUTE ONLY): 25 min ? ?Charges:  $Therapeutic Activity: 8-22 mins ?$Neuromuscular Re-education: 8-22 mins          ?          ? ?Marye Round, PT DPT ?Acute Rehabilitation Services ?Pager [phone]  ?Office [phone] ? ? ? ?Delanee Xin E Stroup ?09/28/2021, 10:46 AM ? ?

## 2021-09-28 NOTE — Progress Notes (Signed)
?PROGRESS NOTE ? ?Joshua Price JHE:[id number] DOB: 1961/05/13 DOA: 09/05/2021 ?PCP: Dartha Lodge, FNP ? ? LOS: 23 days  ? ?Brief Narrative / Interim history: ?61 y.o. male with history of HTN, smoking, esophageal varcies presenting after he was found down on the concrete porch of his home.  He has been on the phone speaking with a relative and she heard him fall and he was able to state that he was on the ground and could not get up family member stated that this happened at 1645 on 2/25 and the son was able to get there within 5 minutes, he then called EMS.  On arrival he had decreased alertness, right hemineglect, dysarthric speech.  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  He was admitted to the neuro ICU.  CT scans were repeated which showed stable findings.   Stay complicated by hypernatremia and aspiration pna.  Continues to have significant dysarthria.  Placement issue.  Plan for skilled nursing facility placement.  TOC following ? ?Subjective / 24h Interval events: ?Remains minimally verbal.  Appears comfortable ? ?Assesement and Plan: ?Principal Problem: ?  ICH (intracerebral hemorrhage) (HCC) ?Active Problems: ?  Hypernatremia ?  Aspiration pneumonia (HCC) ?  Essential hypertension ?  Altered mental status ?  Alcohol withdrawal (HCC) ?  Oropharyngeal dysphagia ?  Malnutrition of moderate degree ?  Hypokalemia ?  Hyperglycemia ? ? 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  Cerebral edema was noted on imaging studies.  He was initially admitted to the neuro ICU and started on hypertonic saline.  Repeat CT scan showed stability and he was eventually transferred to the hospitalist service.  He has right hemiplegia, severe dysarthria which seem to be persistent.  2D echo showed normal systolic function,  grade 1 diastolic dysfunction.  Prognosis is quite poor.  Goals of care was discussed with family by palliative care.  Remains full code, continue full scope of treatment for now.  Therapies recommending SNF, placement pending, Medicaid pending ? ?Active problems ?Aspiration pneumonia (HCC) -had a febrile episode on 3/3, chest x-ray showed a left lower lobe infiltrate.  Completed a course of Augmentin. ? ?Hypernatremia -resolved ? ?Altered mental status -sequelae of ICH, unclear recovery potential at this point ? ?Essential hypertension -Presented with hypertensive emergency.  Currently on amlodipine and metoprolol.  Blood pressure normal this morning ? ?Oropharyngeal dysphagia -initially on tube feeds, these were discontinued 3/9.  He is now on a dysphagia 1 diet, and he needs to be fed.  Aspiration precautions ? ?Alcohol withdrawal (HCC) -s/p CIWA protocol, resolved ? ?Malnutrition of moderate degree- Signs/Symptoms: mild fat depletion, moderate fat depletion, mild muscle depletion, severe muscle depletion ? ?Hypokalemia -Continue daily supplementation, monitor levels ? ?Hyperglycemia -Continue  SSI coverage, last hemoglobin A1c of 5.3 ? ?CBG (last 3)  ?Recent Labs  ?  09/27/21 ?2123 09/28/21 ?0808 09/28/21 ?1059  ?GLUCAP 178* 107* 122*  ? ? ? ?Scheduled Meds: ? amLODipine  10 mg Oral Daily  ? vitamin C  500 mg Oral BID  ? chlorhexidine  15 mL Mouth Rinse BID  ? Chlorhexidine Gluconate Cloth  6 each Topical Daily  ? cholecalciferol  1,000 Units Oral Daily  ? folic acid  1 mg Oral Daily  ? heparin injection (subcutaneous)  5,000 Units Subcutaneous Q8H  ? insulin aspart  0-15 Units Subcutaneous TID WC  ?  insulin aspart  0-5 Units Subcutaneous QHS  ? mouth rinse  15 mL Mouth Rinse q12n4p  ? metoprolol tartrate  25 mg Oral BID  ? multivitamin with minerals  1 tablet Oral Daily  ? nystatin  5 mL Oral QID  ? pantoprazole  40 mg Oral QHS  ? potassium chloride  40 mEq Oral Daily  ? thiamine  100 mg Oral Daily   ? ?Continuous Infusions: ?PRN Meds:.acetaminophen **OR** acetaminophen (TYLENOL) oral liquid 160 mg/5 mL **OR** acetaminophen, hydrALAZINE, labetalol, lip balm, polyethylene glycol ? ?Diet Orders (From admission, onward)  ? ?  Start     Ordered  ? 09/09/21 1418  DIET - DYS 1 Room service appropriate? Yes; Fluid consistency: Nectar Thick  Diet effective now       ?Question Answer Comment  ?Room service appropriate? Yes   ?Fluid consistency: Nectar Thick   ?  ? 09/09/21 1417  ? ?  ?  ? ?  ? ? ?DVT prophylaxis: heparin injection 5,000 Units Start: 09/12/21 2200 ?SCD's Start: 09/05/21 1942 ? ? ?Lab Results  ?Component Value Date  ? PLT 278 09/27/2021  ? ? ?  Code Status: Full Code ? ?Family Communication: No family at bedside ? ?Status is: Inpatient ? ?Remains inpatient appropriate because: waiting for safe discharge avenue ? ?Level of care: Progressive ? ?Microbiology  ?none ? ?Antimicrobials: ?none  ? ? ?Objective: ?Vitals:  ? 09/27/21 1935 09/27/21 2325 09/28/21 0403 09/28/21 1112  ?BP: 138/81 136/87 (!) 134/94 128/82  ?Pulse: 85 81 77 78  ?Resp: 20 20 20 18   ?Temp: 98.9 ?F (37.2 ?C) 98.6 ?F (37 ?C) 98.7 ?F (37.1 ?C) 97.6 ?F (36.4 ?C)  ?TempSrc: Oral Oral Oral Oral  ?SpO2: 97% 98% 95% 100%  ?Weight:   75.7 kg   ?Height:      ? ? ?Intake/Output Summary (Last 24 hours) at 09/28/2021 1326 ?Last data filed at 09/27/2021 2120 ?Gross per 24 hour  ?Intake 240 ml  ?Output 550 ml  ?Net -310 ml  ? ? ?Wt Readings from Last 3 Encounters:  ?09/28/21 75.7 kg  ?07/05/17 65.8 kg  ?02/24/15 81.2 kg  ? ? ?Examination: ? ?Constitutional: NAD ?Respiratory: CTA ?Cardiovascular: RRR ? ?Data Reviewed: I have independently reviewed following labs and imaging studies  ? ?CBC ?Recent Labs  ?Lab 09/22/21 ?09/24/21 09/23/21 ?09/25/21 09/27/21 ?0410  ?WBC 7.4 7.0 7.8  ?HGB 11.7* 10.6* 11.3*  ?HCT 35.4* 32.9* 34.1*  ?PLT 231 268 278  ?MCV 98.9 97.3 95.0  ?MCH 32.7 31.4 31.5  ?MCHC 33.1 32.2 33.1  ?RDW 12.8 12.6 12.5  ? ? ? ?Recent Labs  ?Lab  09/22/21 ?09/24/21 09/23/21 ?09/25/21 09/25/21 ?0059 09/26/21 ?09/28/21 09/27/21 ?0410  ?NA 146* 140 139 138 139  ?K 3.6 3.5 3.2* 3.5 3.6  ?CL 115* 107 107 106 106  ?CO2 23 21* 23 22 23   ?GLUCOSE 112* 103* 98 108* 115*  ?BUN 20 15 12 11 11   ?CREATININE 0.91 0.81 0.79 0.74 0.76  ?CALCIUM 9.1 8.6* 9.0 9.2 9.4  ?AST  --   --   --   --  65*  ?ALT  --   --   --   --  51*  ?ALKPHOS  --   --   --   --  142*  ?BILITOT  --   --   --   --  0.8  ?ALBUMIN  --   --   --   --  2.5*  ? ? ? ?------------------------------------------------------------------------------------------------------------------ ?No results for  input(s): CHOL, HDL, LDLCALC, TRIG, CHOLHDL, LDLDIRECT in the last 72 hours. ? ?Lab Results  ?Component Value Date  ? HGBA1C 5.3 09/07/2021  ? ?------------------------------------------------------------------------------------------------------------------ ?No results for input(s): TSH, T4TOTAL, T3FREE, THYROIDAB in the last 72 hours. ? ?Invalid input(s): FREET3 ? ?Cardiac Enzymes ?No results for input(s): CKMB, TROPONINI, MYOGLOBIN in the last 168 hours. ? ?Invalid input(s): CK ?------------------------------------------------------------------------------------------------------------------ ?No results found for: BNP ? ?CBG: ?Recent Labs  ?Lab 09/27/21 ?1309 09/27/21 ?1652 09/27/21 ?2123 09/28/21 ?[id number] 09/28/21 ?1059  ?GLUCAP 119* 154* 178* 107* 122*  ? ? ? ?No results found for this or any previous visit (from the past 240 hour(s)).  ? ?Radiology Studies: ?No results found. ? ? ?Pamella Pertostin Josede Cicero, MD, PhD ?[facility] ? ?Between 7 am - 7 pm I am available, please contact me via Amion (for emergencies) or Securechat (non urgent messages) ? ?Between 7 pm - 7 am I am not available, please contact night coverage MD/APP via Amion ? ?

## 2021-09-28 NOTE — Progress Notes (Signed)
Left message with patient's stepfather, requesting that patient's mother give CSW a call back to discuss bed offers.   ? ?Reinaldo Raddle, RN, BSN  ?Trauma/Neuro ICU Case Manager ?[phone]  ?

## 2021-09-29 LAB — COMPREHENSIVE METABOLIC PANEL
ALT: 50 U/L — ABNORMAL HIGH (ref 0–44)
AST: 61 U/L — ABNORMAL HIGH (ref 15–41)
Albumin: 2.6 g/dL — ABNORMAL LOW (ref 3.5–5.0)
Alkaline Phosphatase: 146 U/L — ABNORMAL HIGH (ref 38–126)
Anion gap: 10 (ref 5–15)
BUN: 12 mg/dL (ref 6–20)
CO2: 24 mmol/L (ref 22–32)
Calcium: 9.6 mg/dL (ref 8.9–10.3)
Chloride: 106 mmol/L (ref 98–111)
Creatinine, Ser: 0.76 mg/dL (ref 0.61–1.24)
GFR, Estimated: >60 mL/min (ref >60)
Glucose, Bld: 115 mg/dL — ABNORMAL HIGH (ref 70–99)
Potassium: 3.8 mmol/L (ref 3.5–5.1)
Sodium: 140 mmol/L (ref 135–145)
Total Bilirubin: 0.6 mg/dL (ref 0.3–1.2)
Total Protein: 8.1 g/dL (ref 6.5–8.1)

## 2021-09-29 LAB — CBC
HCT: 33.5 % — ABNORMAL LOW (ref 39.0–52.0)
Hemoglobin: 11.5 g/dL — ABNORMAL LOW (ref 13.0–17.0)
MCH: 32.8 pg (ref 26.0–34.0)
MCHC: 34.3 g/dL (ref 30.0–36.0)
MCV: 95.4 fL (ref 80.0–100.0)
Platelets: 268 10*3/uL (ref 150–400)
RBC: 3.51 MIL/uL — ABNORMAL LOW (ref 4.22–5.81)
RDW: 12.6 % (ref 11.5–15.5)
WBC: 6.8 10*3/uL (ref 4.0–10.5)
nRBC: 0 % (ref 0.0–0.2)

## 2021-09-29 LAB — GLUCOSE, CAPILLARY
Glucose-Capillary: 99 mg/dL (ref 70–99)
Glucose-Capillary: 97 mg/dL (ref 70–99)
Glucose-Capillary: 123 mg/dL — ABNORMAL HIGH (ref 70–99)
Glucose-Capillary: 121 mg/dL — ABNORMAL HIGH (ref 70–99)

## 2021-09-29 NOTE — Progress Notes (Signed)
?PROGRESS NOTE ? ?Joshua Price [id number] DOB: [birth year] DOA: 09/05/2021 ?PCP: Dartha LodgeSteele, Anthony, FNP ? ? LOS: 24 days  ? ?Brief Narrative / Interim history: ?61 y.o. male with history of HTN, smoking, esophageal varcies presenting after he was found down on the concrete porch of his home.  He has been on the phone speaking with a relative and she heard him fall and he was able to state that he was on the ground and could not get up family member stated that this happened at 1645 on 2/25 and the son was able to get there within 5 minutes, he then called EMS.  On arrival he had decreased alertness, right hemineglect, dysarthric speech.  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  He was admitted to the neuro ICU.  CT scans were repeated which showed stable findings.   Stay complicated by hypernatremia and aspiration pna.  Continues to have significant dysarthria.  Placement issue.  Plan for skilled nursing facility placement.  TOC following ? ?Subjective / 24h Interval events: ?Appears frustrated this morning.  Word salad makes it difficult to understand what he is saying ? ?Assesement and Plan: ?Principal Problem: ?  ICH (intracerebral hemorrhage) (HCC) ?Active Problems: ?  Hypernatremia ?  Aspiration pneumonia (HCC) ?  Essential hypertension ?  Altered mental status ?  Alcohol withdrawal (HCC) ?  Oropharyngeal dysphagia ?  Malnutrition of moderate degree ?  Hypokalemia ?  Hyperglycemia ? ? 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  Cerebral edema was noted on imaging studies.  He was initially admitted to the neuro ICU and started on hypertonic saline.  Repeat CT scan showed stability and he was eventually transferred to the hospitalist service.  He has right hemiplegia, severe dysarthria which seem to be  persistent.  2D echo showed normal systolic function, grade 1 diastolic dysfunction.  Prognosis is quite poor.  Goals of care was discussed with family by palliative care.  Remains full code, continue full scope of treatment for now.  Therapies recommending SNF, placement pending, Medicaid pending ? ?Active problems ?Aspiration pneumonia (HCC) -had a febrile episode on 3/3, chest x-ray showed a left lower lobe infiltrate.  Completed a course of Augmentin. ? ?Hypernatremia -resolved ? ?Altered mental status -sequelae of ICH, unclear recovery potential at this point ? ?Essential hypertension -Presented with hypertensive emergency.  Currently on amlodipine and metoprolol.  Blood pressure normal this morning ? ?Oropharyngeal dysphagia -initially on tube feeds, these were discontinued 3/9.  He is now on a dysphagia 1 diet, and he needs to be fed.  Aspiration precautions ? ?Alcohol withdrawal (HCC) -s/p CIWA protocol, resolved ? ?Malnutrition of moderate degree- Signs/Symptoms: mild fat depletion, moderate fat depletion, mild muscle depletion, severe muscle depletion ? ?Hypokalemia -Continue daily supplementation, monitor levels ? ?Hyperglycemia -Continue  SSI coverage, last hemoglobin A1c of 5.3 ? ?CBG (last 3)  ?Recent Labs  ?  09/28/21 ?1832 09/28/21 ?2104 09/29/21 ?[id number]  ?GLUCAP 148* 142* 99  ? ? ? ?Scheduled Meds: ? amLODipine  10 mg Oral Daily  ? vitamin C  500 mg Oral BID  ? chlorhexidine  15 mL Mouth Rinse BID  ? Chlorhexidine Gluconate Cloth  6 each Topical Daily  ? cholecalciferol  1,000 Units Oral Daily  ? folic acid  1 mg Oral Daily  ? heparin injection (subcutaneous)  5,000 Units Subcutaneous Q8H  ?  insulin aspart  0-15 Units Subcutaneous TID WC  ? insulin aspart  0-5 Units Subcutaneous QHS  ? mouth rinse  15 mL Mouth Rinse q12n4p  ? metoprolol tartrate  25 mg Oral BID  ? multivitamin with minerals  1 tablet Oral Daily  ? nystatin  5 mL Oral QID  ? pantoprazole  40 mg Oral QHS  ? potassium chloride  40 mEq  Oral Daily  ? thiamine  100 mg Oral Daily  ? ?Continuous Infusions: ?PRN Meds:.acetaminophen **OR** acetaminophen (TYLENOL) oral liquid 160 mg/5 mL **OR** acetaminophen, hydrALAZINE, labetalol, lip balm, polyethylene glycol ? ?Diet Orders (From admission, onward)  ? ?  Start     Ordered  ? 09/09/21 1418  DIET - DYS 1 Room service appropriate? Yes; Fluid consistency: Nectar Thick  Diet effective now       ?Question Answer Comment  ?Room service appropriate? Yes   ?Fluid consistency: Nectar Thick   ?  ? 09/09/21 1417  ? ?  ?  ? ?  ? ? ?DVT prophylaxis: heparin injection 5,000 Units Start: 09/12/21 2200 ?SCD's Start: 09/05/21 1942 ? ? ?Lab Results  ?Component Value Date  ? PLT 268 09/29/2021  ? ? ?  Code Status: Full Code ? ?Family Communication: No family at bedside ? ?Status is: Inpatient ? ?Remains inpatient appropriate because: waiting for safe discharge avenue ? ?Level of care: Progressive ? ?Microbiology  ?none ? ?Antimicrobials: ?none  ? ? ?Objective: ?Vitals:  ? 09/28/21 2313 09/29/21 0317 09/29/21 0417 09/29/21 0745  ?BP: (!) 146/84 134/90  125/88  ?Pulse: 77 74  79  ?Resp: 17 18  15   ?Temp: 98.7 ?F (37.1 ?C) 98.3 ?F (36.8 ?C)  97.9 ?F (36.6 ?C)  ?TempSrc: Oral Oral  Oral  ?SpO2: 98% 97%  98%  ?Weight:   71.4 kg   ?Height:      ? ? ?Intake/Output Summary (Last 24 hours) at 09/29/2021 0958 ?Last data filed at 09/29/[phone] 2130 ?Gross per 24 hour  ?Intake 875 ml  ?Output 700 ml  ?Net 175 ml  ? ? ?Wt Readings from Last 3 Encounters:  ?09/29/21 71.4 kg  ?07/05/17 65.8 kg  ?02/24/15 81.2 kg  ? ? ?Examination: ? ?Constitutional: NAD ?Respiratory: CTA ?Cardiovascular: RRR ? ?Data Reviewed: I have independently reviewed following labs and imaging studies  ? ?CBC ?Recent Labs  ?Lab 09/23/21 ?09/25/21 09/27/21 ?0410 09/29/21 ?0502  ?WBC 7.0 7.8 6.8  ?HGB 10.6* 11.3* 11.5*  ?HCT 32.9* 34.1* 33.5*  ?PLT 268 278 268  ?MCV 97.3 95.0 95.4  ?MCH 31.4 31.5 32.8  ?MCHC 32.2 33.1 34.3  ?RDW 12.6 12.5 12.6  ? ? ? ?Recent Labs  ?Lab  09/23/21 ?09/25/21 09/25/21 ?0059 09/26/21 ?09/28/21 09/27/21 ?0410 09/29/21 ?0502  ?NA 140 139 138 139 140  ?K 3.5 3.2* 3.5 3.6 3.8  ?CL 107 107 106 106 106  ?CO2 21* 23 22 23 24   ?GLUCOSE 103* 98 108* 115* 115*  ?BUN 15 12 11 11 12   ?CREATININE 0.81 0.79 0.74 0.76 0.76  ?CALCIUM 8.6* 9.0 9.2 9.4 9.6  ?AST  --   --   --  65* 61*  ?ALT  --   --   --  51* 50*  ?ALKPHOS  --   --   --  142* 146*  ?BILITOT  --   --   --  0.8 0.6  ?ALBUMIN  --   --   --  2.5* 2.6*  ? ? ? ?------------------------------------------------------------------------------------------------------------------ ?No results for input(s): CHOL, HDL,  LDLCALC, TRIG, CHOLHDL, LDLDIRECT in the last 72 hours. ? ?Lab Results  ?Component Value Date  ? HGBA1C 5.3 09/07/2021  ? ?------------------------------------------------------------------------------------------------------------------ ?No results for input(s): TSH, T4TOTAL, T3FREE, THYROIDAB in the last 72 hours. ? ?Invalid input(s): FREET3 ? ?Cardiac Enzymes ?No results for input(s): CKMB, TROPONINI, MYOGLOBIN in the last 168 hours. ? ?Invalid input(s): CK ?------------------------------------------------------------------------------------------------------------------ ?No results found for: BNP ? ?CBG: ?Recent Labs  ?Lab 09/28/21 ?0808 09/28/21 ?1059 09/28/21 ?1832 09/28/21 ?2104 09/29/21 ?1601  ?GLUCAP 107* 122* 148* 142* 99  ? ? ? ?No results found for this or any previous visit (from the past 240 hour(s)).  ? ?Radiology Studies: ?No results found. ? ? ?Pamella Pert, MD, PhD ?[facility] ? ?Between 7 am - 7 pm I am available, please contact me via Amion (for emergencies) or Securechat (non urgent messages) ? ?Between 7 pm - 7 am I am not available, please contact night coverage MD/APP via Amion ? ?

## 2021-09-29 NOTE — Progress Notes (Signed)
Occupational Therapy Treatment ?Patient Details ?Name: Joshua Price ?MRN: [id number] ?DOB: Apr 09, 1961 ?Today's Date: 09/29/2021 ? ? ?History of present illness 61 yo male presents to [facility] on 2/25 with fall at home, + ETOH, BP 218/134. CT head reveals acute parenchymal hemorrhage involving left lentiform nucleus and  surrounding white matter; repeat CT shows interval increase in side of intraparenchymal hematoma centered in L BG, increased edema and 56mm L-to-R midline shift. PMH includes HTN, smoker, esophageal varices. ?  ?OT comments ? Joshua Price is making incremental progress. Upon arrival, pt was crooked  in the bed with BLE hanging off, however from this position he transition to sitting with just mod A. Pt was able to sit for brief grooming tasks with mod A. Pt is still limited by impaired cognition, impaired communication, poor attention to the R, R hemiparesis and agitation. Pt required increased assist for functional tasks and became agitated and resisting therapist and had to transfer back to the bed. He continues to benefit from OT acutely. D/c recommendation remains appropriate.  ? ?Recommendations for follow up therapy are one component of a multi-disciplinary discharge planning process, led by the attending physician.  Recommendations may be updated based on patient status, additional functional criteria and insurance authorization. ?   ?Follow Up Recommendations ? Skilled nursing-short term rehab (<3 hours/day)  ?  ?Assistance Recommended at Discharge Frequent or constant Supervision/Assistance  ?Patient can return home with the following ? Two people to help with walking and/or transfers;Two people to help with bathing/dressing/bathroom;Direct supervision/assist for medications management;Direct supervision/assist for financial management;Assistance with cooking/housework;Assist for transportation;Help with stairs or ramp for entrance;Assistance with feeding ?  ?Equipment Recommendations ? Other (comment)  ?   ?   ?Precautions / Restrictions Precautions ?Precautions: Fall ?Precaution Comments: Pt pushes to the R ?Restrictions ?Weight Bearing Restrictions: No  ? ? ?  ? ?Mobility Bed Mobility ?Overal bed mobility: Needs Assistance ?  ?  ?  ?Supine to sit: Mod assist ?  ?  ?General bed mobility comments: incr time and cues, HOB relatively flat ?  ? ?Transfers ?Overall transfer level: Needs assistance ?  ?  ?  ?  ?  ?  ?  ?  ?General transfer comment: pt declining and resisting attempt ?  ?  ?Balance Overall balance assessment: Needs assistance ?Sitting-balance support: Single extremity supported, Feet supported ?Sitting balance-Leahy Scale: Fair ?Sitting balance - Comments: periods of supervision when sitting statically, R lateral and posterior leaning with LE dynamic movement ?  ?  ?  ?  ?  ?  ?  ?  ?  ?  ?  ?  ?  ?  ?  ?   ? ?ADL either performed or assessed with clinical judgement  ? ?ADL Overall ADL's : Needs assistance/impaired ?  ?  ?Grooming: Wash/dry hands;Wash/dry face;Sitting;Moderate assistance ?Grooming Details (indicate cue type and reason): for sitting balance, sequencing and initiation ?  ?  ?  ?  ?  ?  ?  ?  ?  ?  ?  ?  ?  ?  ?Functional mobility during ADLs: Moderate assistance (at bed level) ?General ADL Comments: Pt with delayed processing, Pt using L hand (non-dominant) so coordinaton deficits - imporve bed mobility today with cues ?  ? ?Extremity/Trunk Assessment Upper Extremity Assessment ?Upper Extremity Assessment: RUE deficits/detail ?RUE Deficits / Details: flaccid--PROM WNL ?RUE Sensation: decreased light touch ?RUE Coordination: decreased fine motor;decreased gross motor ?  ?Lower Extremity Assessment ?Lower Extremity Assessment: Defer to PT evaluation ?  ?  ?  ? ?  Vision   ?Vision Assessment?: Vision impaired- to be further tested in functional context ?Additional Comments: wear glassess - tracking trapist in all quadrants but seems to have R inattention / L gaze preference ?  ?Perception  Perception ?Perception: Impaired ?  ?Praxis Praxis ?Praxis: Intact ?  ? ?Cognition Arousal/Alertness: Awake/alert ?Behavior During Therapy: Agitated, Restless ?Overall Cognitive Status: Impaired/Different from baseline ?Area of Impairment: Problem solving, Attention, Awareness ?  ?  ?  ?  ?  ?  ?  ?  ?  ?Current Attention Level: Sustained ?  ?Following Commands: Follows one step commands inconsistently, Follows one step commands with increased time ?Safety/Judgement: Decreased awareness of safety, Decreased awareness of deficits ?Awareness: Intellectual ?Problem Solving: Slow processing, Requires verbal cues, Requires tactile cues, Difficulty sequencing ?General Comments: pt continues to be irritable, does better with bed mobility and movement when there is a functional task/goal. reuqired max A multimodal cues and incr time for command following. poor problem solving and incr agitation as tasks got difficult ?  ?  ?   ?   ?   ?General Comments VSS on RA  ? ? ?Pertinent Vitals/ Pain       Pain Assessment ?Pain Assessment: Faces ?Faces Pain Scale: Hurts a [place] bit ?Pain Location: general with sitting upright ?Pain Descriptors / Indicators: Grimacing ?Pain Intervention(s): Limited activity within patient's tolerance, Monitored during session ? ? ?Frequency ? Min 2X/week  ? ? ? ? ?  ?Progress Toward Goals ? ?OT Goals(current goals can now be found in the care plan section) ? Progress towards OT goals: Progressing toward goals ? ?Acute Rehab OT Goals ?Patient Stated Goal: unable to state ?OT Goal Formulation: Patient unable to participate in goal setting ?Time For Goal Achievement: 10/06/21 ?Potential to Achieve Goals: Fair ?ADL Goals ?Pt Will Perform Grooming: with supervision;sitting ?Pt Will Perform Upper Body Bathing: with min assist;sitting ?Pt Will Transfer to Toilet: with mod assist;squat pivot transfer;bedside commode ?Pt/caregiver will Perform Home Exercise Program: Increased ROM;Increased strength;Right  Upper extremity;With Supervision;With written HEP provided ?Additional ADL Goal #1: Pt will follow 50% of one step commands ?Additional ADL Goal #2: Pt will be able to roll left and right with min A with at bed level to facilitate basic ADL and in preparation for sitting EOB ?Additional ADL Goal #3: Pt will track to R visual fields 50% of the time for finding ADL objects on R side of tray/surface  ?Plan Discharge plan remains appropriate;Frequency remains appropriate   ? ?   ?AM-PAC OT "6 Clicks" Daily Activity     ?Outcome Measure ? ? Help from another person eating meals?: A Lot ?Help from another person taking care of personal grooming?: A Lot ?Help from another person toileting, which includes using toliet, bedpan, or urinal?: A Lot ?Help from another person bathing (including washing, rinsing, drying)?: A Lot ?Help from another person to put on and taking off regular upper body clothing?: A Lot ?Help from another person to put on and taking off regular lower body clothing?: Total ?6 Click Score: 11 ? ?  ?End of Session   ? ?OT Visit Diagnosis: Unsteadiness on feet (R26.81);Other abnormalities of gait and mobility (R26.89);Muscle weakness (generalized) (M62.81);Low vision, both eyes (H54.2);Other symptoms and signs involving cognitive function;Hemiplegia and hemiparesis;Cognitive communication deficit (R41.841) ?Symptoms and signs involving cognitive functions: Nontraumatic intracerebral hemorrhage ?Hemiplegia - Right/Left: Right ?Hemiplegia - dominant/non-dominant: Dominant ?Hemiplegia - caused by: Nontraumatic intracerebral hemorrhage ?  ?Activity Tolerance Patient tolerated treatment well ?  ?Patient Left in bed;with call  bell/phone within reach;with bed alarm set ?  ?Nurse Communication Other (comment) ?  ? ?   ? ?Time: [id number] ?OT Time Calculation (min): 19 min ? ?Charges: OT General Charges ?$OT Visit: 1 Visit ?OT Treatments ?$Self Care/Home Management : 8-22 mins ? ? ?Kenedi Cilia A Eliani Leclere ?09/29/2021, 4:36  PM ?

## 2021-09-29 NOTE — TOC Progression Note (Signed)
Transition of Care (TOC) - Progression Note  ? ? ?Patient Details  ?Name: Joshua Price ?MRN: [id number] ?Date of Birth: 1960/08/26 ? ?Transition of Care (TOC) CM/SW Contact  ?Vinie Sill, LCSW ?Phone Number: ?09/29/2021, 3:11 PM ? ?Clinical Narrative:    ? ?Completed LOG- sent to [facility] for processing  ? ?TOC waiting to see if approved - will update medical team once informed  ? ?Thurmond Butts, MSW, LCSW ?Clinical Social Worker ? ? ? ?Expected Discharge Plan: [place] ?Barriers to Discharge: Inadequate or no insurance, SNF Pending payor source - LOG ? ?Expected Discharge Plan and Services ?Expected Discharge Plan: [place] ?In-house Referral: Clinical Social Work ?  ?  ?  ?                ?  ?  ?  ?  ?  ?  ?  ?  ?  ?  ? ? ?Social Determinants of Health (SDOH) Interventions ?  ? ?Readmission Risk Interventions ?No flowsheet data found. ? ?

## 2021-09-29 NOTE — Progress Notes (Signed)
Nutrition Follow-up ? ?DOCUMENTATION CODES:  ? ?Non-severe (moderate) malnutrition in context of social or environmental circumstances ? ?INTERVENTION:  ? ?- Continue Vital Cuisine Shake TID, each supplement provides 520 kcal and 22 grams of protein ?  ?- Continue Mighty Shake TID, each supplement provides 330 kcal and 9 grams of protein ?  ?- Encourage PO intake and provide feeding assistance ?  ?- Continue MVI with minerals daily ? ?NUTRITION DIAGNOSIS:  ? ?Moderate Malnutrition related to social / environmental circumstances (EtOH abuse) as evidenced by mild fat depletion, moderate fat depletion, mild muscle depletion, severe muscle depletion. ? ?Ongoing, being addressed via oral nutrition supplements ? ?GOAL:  ? ?Patient will meet greater than or equal to 90% of their needs ? ?Progressing ? ?MONITOR:  ? ?PO intake, Supplement acceptance, Labs, Weight trends, Diet advancement ? ?REASON FOR ASSESSMENT:  ? ?Other (new cortrak) ?  ? ?ASSESSMENT:  ? ?Pt with a history of HTN, tobacco abuse (.25 pack/d), EtOH abuse, and esophageal varices presented to ED as a code stroke after being found down at home. In ED, pt in hypertensive crisis and imaging showing an acute left basal ganglia hemorrhage. ? ?03/01 - Cortrak placed (tip gastric) ?03/09 - Cortrak removed ? ?Attempted to speak with pt at bedside. Pt awake and had finished with breakfast meal. He had consumed about 25% of his meal. RD offered to feed pt additional foods from tray. Pt fell asleep during RD visit. ? ?Admit weight: 69 kg ?Current weight: 71.8 kg ? ?Meal Completion: 0-100% x last 8 documented meals ? ?Medications reviewed and include: vitamin C 500 mg BID, cholecalciferol 1000 units daily, folic acid, SSI, MVI with minerals daily, nystatin mouthwash, protonix, klor-con 40 mEq daily, thiamine ? ?Vitamin/Mineral Profile: ?Thiamine B1: 87.4 (WNL) ?Vitamin B12: 1384 (H) ?Folate B9: 22.5 (WNL) ?Vitamin A: 24.0 (WNL) ?Vitamin D: 6.9 (L) ?Vitamin C: <0.1  (L) ?Zinc: 60 (WNL) ? ?Labs reviewed: elevated LFTs ?CBG's: 97-123 x 24 hours ? ?UOP: 1100 ml x 24 hours ?I/O's: +4.9 L since admit ? ?Diet Order:   ?Diet Order   ? ?       ?  DIET - DYS 1 Room service appropriate? Yes; Fluid consistency: Nectar Thick  Diet effective now       ?  ? ?  ?  ? ?  ? ? ?EDUCATION NEEDS:  ? ?Not appropriate for education at this time ? ?Skin:  Skin Assessment: Reviewed RN Assessment (ecchymosis to the right shoulder) ? ?Last BM:  09/28/21 large type 6 ? ?Height:  ? ?Ht Readings from Last 1 Encounters:  ?09/05/21 5\' 6"  (1.676 m)  ? ? ?Weight:  ? ?Wt Readings from Last 1 Encounters:  ?09/30/21 71.8 kg  ? ? ?Ideal Body Weight:  64.5 kg ? ?BMI:  Body mass index is 25.55 kg/m?. ? ?Estimated Nutritional Needs:  ? ?Kcal:  2100-2300 kcal/day ? ?Protein:  105-120 g/day ? ?Fluid:  2.1-2.3 L/day ? ? ? ?10/02/21, MS, RD, LDN ?Inpatient Clinical Dietitian ?Please see AMiON for contact information. ? ?

## 2021-09-29 NOTE — TOC Initial Note (Signed)
Transition of Care (TOC) - Initial/Assessment Note  ? ? ?Patient Details  ?Name: Joshua Price ?MRN: [id number] ?Date of Birth: Apr 19, 1961 ? ?Transition of Care (TOC) CM/SW Contact:    ?Eduard Roux, LCSW ?Phone Number: ?09/29/2021, 11:27 AM ? ?Clinical Narrative:                 ? ?Received call form patient's mother, Ms Larita Fife. CSW introduced self and explained role. CSW discussed possible SNF bed options and provided bed offers. She states her preferred SNF is [facility] CSW explained, medicaid is pending, and patient can discharge to SNF if SNF approves LOG. CSW will keep her posted . She states no questions at this time. ? ?CSW will informed TOC Leadership -possible placement option and LOG is requested. ?CSW will update family and medical team IF LOG get approved  ?CSW will continue to follow and assist with discharge planning. ? ?Antony Blackbird, MSW, LCSW ?Clinical Social Worker ? ? ? ?Expected Discharge Plan: Skilled Nursing Facility ?Barriers to Discharge: Inadequate or no insurance, SNF Pending payor source - LOG ? ? ?Patient Goals and CMS Choice ?  ?  ?  ? ?Expected Discharge Plan and Services ?Expected Discharge Plan: Skilled Nursing Facility ?In-house Referral: Clinical Social Work ?  ?  ?  ?                ?  ?  ?  ?  ?  ?  ?  ?  ?  ?  ? ?Prior Living Arrangements/Services ?  ?Lives with:: Self ?Patient language and need for interpreter reviewed:: No ?       ?  ?  ?  ?  ? ?Activities of Daily Living ?Home Assistive Devices/Equipment: None ?ADL Screening (condition at time of admission) ?Patient's cognitive ability adequate to safely complete daily activities?: Yes ?Is the patient deaf or have difficulty hearing?: No ?Does the patient have difficulty seeing, even when wearing glasses/contacts?: No ?Does the patient have difficulty concentrating, remembering, or making decisions?: No ?Patient able to express need for assistance with ADLs?: Yes ?Does the patient have difficulty dressing or  bathing?: No ?Independently performs ADLs?: Yes (appropriate for developmental age) ?Does the patient have difficulty walking or climbing stairs?: No ?Weakness of Legs: None ?Weakness of Arms/Hands: None ? ?Permission Sought/Granted ?Permission sought to share information with : Family Supports ?  ? Share Information with NAME: Larita Fife arris ? Permission granted to share info w AGENCY: SNF ? Permission granted to share info w Relationship: mother ? Permission granted to share info w Contact Information: [phone] ? ?Emotional Assessment ?  ?  ?  ?Orientation: : Oriented to Self ?Alcohol / Substance Use: Alcohol Use ?Psych Involvement: No (comment) ? ?Admission diagnosis:  ICH (intracerebral hemorrhage) (HCC) [I61.9] ?Altered mental status, unspecified altered mental status type [R41.82] ?Left-sided nontraumatic intracerebral hemorrhage, unspecified cerebral location [facility]) [I61.9] ?Patient Active Problem List  ? Diagnosis Date Noted  ? Hyperglycemia 09/16/2021  ? Aspiration pneumonia (HCC) 09/13/2021  ? Fever 09/12/2021  ? Cerebral edema (HCC) 09/11/2021  ? Alcohol withdrawal (HCC) 09/11/2021  ? Hypokalemia 09/11/2021  ? Hypernatremia 09/11/2021  ? Oropharyngeal dysphagia 09/11/2021  ? Malnutrition of moderate degree 09/09/2021  ? ICH (intracerebral hemorrhage) (HCC) 09/05/2021  ? Altered mental status   ? Essential hypertension 08/22/2013  ? Smoking 08/22/2013  ? Dental cavities 08/22/2013  ? ?PCP:  Dartha Lodge, FNP ?Pharmacy:   ?[id number] - [place], [place] - [address] ?[address] ?[place]  [address] ?Phone: [phone] Fax: [phone] ? ?[facility] ?[address], [address] ?[place] [address] ?Phone: [phone] Fax: [phone] ? ?[facility] - [place], [place] - [address] ?[address] [place] [address] ?Phone: [phone] Fax: [phone] ? ? ? ? ?Social Determinants of Health  (SDOH) Interventions ?  ? ?Readmission Risk Interventions ?No flowsheet data found. ? ? ?

## 2021-09-30 LAB — GLUCOSE, CAPILLARY
Glucose-Capillary: 97 mg/dL (ref 70–99)
Glucose-Capillary: 102 mg/dL — ABNORMAL HIGH (ref 70–99)
Glucose-Capillary: 67 mg/dL — ABNORMAL LOW (ref 70–99)
Glucose-Capillary: 89 mg/dL (ref 70–99)
Glucose-Capillary: 107 mg/dL — ABNORMAL HIGH (ref 70–99)

## 2021-09-30 MED ORDER — LORAZEPAM 2 MG/ML IJ SOLN
1.0000 mg | Freq: Four times a day (QID) | INTRAMUSCULAR | Status: DC | PRN
  Filled 2021-09-30: qty 1

## 2021-09-30 MED ORDER — OLANZAPINE 5 MG PO TABS
2.5000 mg | ORAL_TABLET | Freq: Every day | ORAL | Status: DC
  Administered 2021-09-30 – 2021-10-01 (×2): 2.5 mg via ORAL
  Filled 2021-09-30 (×2): qty 1

## 2021-09-30 MED ORDER — OLANZAPINE 5 MG PO TABS
5.0000 mg | ORAL_TABLET | Freq: Every day | ORAL | Status: DC
Start: 2021-09-30 — End: 2021-10-01
  Administered 2021-09-30: 5 mg via ORAL
  Filled 2021-09-30: qty 1

## 2021-09-30 MED ORDER — LORAZEPAM 0.5 MG PO TABS
0.5000 mg | ORAL_TABLET | Freq: Two times a day (BID) | ORAL | Status: DC
  Administered 2021-09-30 – 2021-10-01 (×2): 0.5 mg via ORAL
  Filled 2021-09-30 (×2): qty 1

## 2021-09-30 MED ORDER — METHOCARBAMOL 750 MG PO TABS
750.0000 mg | ORAL_TABLET | Freq: Three times a day (TID) | ORAL | Status: DC
  Administered 2021-09-30 – 2021-10-01 (×4): 750 mg via ORAL
  Filled 2021-09-30 (×4): qty 1

## 2021-09-30 NOTE — Progress Notes (Signed)
Speech Language Pathology Treatment: Dysphagia;Cognitive-Linquistic (Aphasia)  ?Patient Details ?Name: Joshua Price ?MRN: [id number] ?DOB: [birth year] ?Today's Date: 09/30/2021 ?Time: 8916-9450 ?SLP Time Calculation (min) (ACUTE ONLY): 29 min ? ?Assessment / Plan / Recommendation ?Clinical Impression ? Pt was seen for co-treatment with physical therapy to optimize pt's positioning and facilitate communication during more functional contexts. He was alert and cooperative during the session. His verbal output was increased compared to that noted during prior sessions. Speech intelligibility was reduced and repetition was often noted. Pt inconsistently responded to yes/no questions, but his articulation of "yes" and "sit" were adequate. He followed 1-step commands within functional contexts with ~50% accuracy given repetition as well as verbal and tactile prompts. Pt was noted to inconsistently attend to therapists with tactile cues. Pt was positioned to the side of the bed and he tolerated thin liquids via cup without overt s/sx of aspiration. Impulsive tendencies continue to be noted; pt consistently consumed 3-4 consecutive swallows and right-sided anterior spillage was noted. However, no s/sx of aspiration were demonstrated. SLP will continue to follow pt.   ?  ?HPI HPI: Patient is a 61 y.o. male with PMH: HTN, tobacco use, esophageal varices who presented to ED via EMS on 2/25 as a code stroke after being found down and unable to get up on concrete porch of his home. EMS noted right sided weakness and incoherent speech. In ED, stat CT revealed acute left basal ganglia hemorrhage. on 2/25, MRI head revealed appearance of increasing size of left basal ganglia intraparenchymal hematoma and slightly increasing mass effect on left lateral ventricle and 79mm of left to right midline shift. MD concerned that pt is not getting enough free water and sodium going up and wanted to repeat MBS. RN stated he drinks the nectar thick  adequately. ?  ?   ?SLP Plan ? Continue with current plan of care ? ?  ?  ?Recommendations for follow up therapy are one component of a multi-disciplinary discharge planning process, led by the attending physician.  Recommendations may be updated based on patient status, additional functional criteria and insurance authorization. ?  ? ?Recommendations  ?Diet recommendations: Dysphagia 1 (puree);Nectar-thick liquid ?Liquids provided via: Cup;Straw ?Medication Administration: Crushed with puree ?Supervision: Staff to assist with self feeding;Full supervision/cueing for compensatory strategies ?Compensations: Slow rate;Small sips/bites;Lingual sweep for clearance of pocketing ?Postural Changes and/or Swallow Maneuvers: Seated upright 90 degrees  ?   ?    ?   ? ? ? ? Oral Care Recommendations: Oral care BID ?Follow Up Recommendations: Skilled nursing-short term rehab (<3 hours/day) ?Assistance recommended at discharge: Frequent or constant Supervision/Assistance ?SLP Visit Diagnosis: Dysphagia, oropharyngeal phase (R13.12);Aphasia (R47.01) ?Plan: Continue with current plan of care ? ? ? ? ?  ?  ?Joshua Price I. Joshua Clock, MS, CCC-SLP ?Acute Rehabilitation Services ?Office number [phone] ?Pager [phone] ? ? ?Joshua Price ? ?09/30/2021, 2:40 PM ? ? ? ?

## 2021-09-30 NOTE — Progress Notes (Addendum)
?Progress Note ? ? ?Patient: Joshua Price BTD:[id number] DOB: 04-07-1961 DOA: 09/05/2021     25 ?DOS: the patient was seen and examined on 09/30/2021 ?  ?Brief hospital course: ?61 y.o. male with history of HTN, smoking, esophageal varcies presenting after he was found down on the concrete porch of his home.  He has been on the phone speaking with a relative and she heard him fall and he was able to state that he was on the ground and could not get up family member stated that this happened at 1645 on 2/25 and the son was able to get there within 5 minutes, he then called EMS.  On arrival he had decreased alertness, right hemineglect, dysarthric speech.  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  He was admitted to the neuro ICU.  CT scans were repeated which showed stable findings.   Stay complicated by hypernatremia and aspiration pna.  Continues to have significant dysarthria.  Placement issue.  Plan for skilled nursing facility placement.  TOC following ?  ? ?  ? 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  Associated cerebral edema treated with hypertonic saline-repeat CT stable and stable midline shift ?-Has right hemiplegia, severe dysarthria ?-Noted with tonicity of RLE therefore as of 3/22 will initiate Robaxin-750 3 times daily ?-Also with some agitation requiring as needed Ativan.  We will begin scheduled low-dose Ativan twice daily and add low-dose Zyprexa twice daily (will need to follow QTc and LFTs noting at baseline patient has mild transaminitis.  Discussed with pharmacy who stated low-dose Zyprexa appropriate in this setting.) ?- Prognosis is poor- patient still with functional deficits.  Goals of care was discussed with family by palliative care.  Remains full code, continue full scope of treatment for now. ? ?Hyperglycemia ?No history of diabetes,  hemoglobin A1c within normal ranges and CBGs well below 200 range ?Patient with ongoing agitation and no indication to continue regular CBG checks therefore to minimize agitation will discontinue CBG checks as well as sliding scale insulin ? ?Aspiration pneumonia (HCC) ?Patient developed fever on 3/3.  UA was unremarkable and not suggestive of infection.  Chest x-ray showed left lower lung infiltrate.  Patient with significant oropharyngeal dysphagia.  Likely aspirated.  Started on Augmentin.   ?-s/p 5-day course. ?-He is currently on room air ?-On dysphagia 1 diet.  Speech therapy following ? ?Oropharyngeal dysphagia ?On dysphagia diet but given cognitive impairments as well as upper extremity hemiplegia requires total feed ?Continue aspiration precautions ? ?Hypernatremia ?resolved ? ? ?Hypokalemia ?Continue daily supplementation, monitor levels ? ?Alcohol withdrawal (HCC) ?Resolved ?Continue thiamine, multivitamins, folic acid. ? ?Malnutrition of moderate degree ?Nutrition Status: ?Nutrition Problem: Moderate Malnutrition ?Etiology: social / environmental circumstances (EtOH abuse) ?Signs/Symptoms: mild fat depletion, moderate fat depletion, mild muscle depletion, severe muscle depletion ?Interventions: Prostat, Tube feeding, MVI ?Initially required tube feeding but now on oral diet ? ? ?Essential hypertension ?Presented with hypertensive emergency.  Currently on amlodipine and metoprolol.  Blood pressure has improved.  Continue current treatment. ? ?Altered mental status-resolved as of 09/30/2021 ?-sequelae of ICH   ?-not sure how much patient will improve ? ? ? ? ? ?  ? ?Subjective:  ?Patient has recently received Ativan but when stimulated became somewhat agitated especially with ocular exam. ? ?Physical Exam: ?Vitals:  ? 09/30/21 0300 09/30/21 0308 09/30/21 0747 09/30/21 1140  ?BP: 133/82  (!) 130/92 102/85  ?Pulse:  76  (!) 103 63  ?Resp:   19 16  ?Temp: 98.3 ?F (36.8 ?C)  98 ?F (36.7 ?C) 97.6 ?F (36.4 ?C)   ?TempSrc: Axillary  Oral Axillary  ?SpO2: 98%  98% 98%  ?Weight:  71.8 kg    ?Height:      ? ? ?Constitutional: NAD, calm, comfortable ?Respiratory: Bilateral lung sounds are clear to auscultation bilaterally, no wheezing, no crackles. Normal respiratory effort. No accessory muscle use.  ?Cardiovascular: Regular rate and rhythm, no murmurs / rubs / gallops. No extremity edema. 2+ pedal pulses. No carotid bruits.  ?Abdomen: no tenderness, no masses palpated. No hepatosplenomegaly. Bowel sounds positive.  ?Musculoskeletal: no clubbing / cyanosis. No joint deformity upper and lower extremities. Good ROM, no contractures.  Increased muscle tone RLE.  ?Skin: no rashes, lesions, ulcers. No induration ?Neurologic: CN 2-12 grossly intact but exam limited by patient participation. Sensation appears to be intact although difficult to assess in affected right side, strength left side 4/5.  No apparent spontaneous movement of RLE and as noted above significant hypertonicity.  R UE is flaccid and appears to be insensate at this time ?Psychiatric: Patient sedated and unable to accurately assess.  When stimulated though he does become agitated. ? ?Data Reviewed: ? ?There are no new results to review at this time. ? ?Family Communication:  ?No family at bedside ? ?Disposition: ?Remains inpatient appropriate because: Unsafe discharge plan, patient requires custodial care at a skilled nursing facility.  ? ?Planned Discharge Destination:  ?Skilled nursing facility for custodial care ? ?Medically stable ?No, currently adjusting psychotropic medications to help with recurrent agitation ? ?COVID vaccination status:  ?Unknown ? ?Consultants: ?PCCM ?Telemedicine ?Procedures: ?Echocardiogram ?Cortrack tube ?Antibiotics: ?None ? ? ? ?Time spent: 45 minutes ? ?Author: ?Junious Silk, NP ?09/30/2021 12:04 PM ? ?For on call review [email or web address].  ?

## 2021-09-30 NOTE — Progress Notes (Signed)
Physical Therapy Treatment ?Patient Details ?Name: Joshua Price ?MRN: [id number] ?DOB: 1960-09-12 ?Today's Date: 09/30/2021 ? ? ?History of Present Illness 61 yo male presents [facility] on [facility] with fall at home, + ETOH, BP 218/134. CT head reveals acute parenchymal hemorrhage involving left lentiform nucleus and  surrounding white matter; repeat CT shows interval increase in side of intraparenchymal hematoma centered in L BG, increased edema and 63mm L-to-R midline shift. PMH includes HTN, smoker, esophageal varices. ? ?  ?PT Comments  ? ? PT saw pt with SLP to aide in communication and pt understanding during functional mobility. Pt overall appears less frustrated and irritable during PT session today, requires max multimodal cuing for following simple one-step commands. Pt tolerated EOB sitting x15 minutes with intermittent posterior truncal assist and anterior cuing to correct posterior LOB, as well as transfer in/out of bed via scoot pivot. Pt continues to present with R inattention, and poor attention in general. Will continue to follow.  ?   ?Recommendations for follow up therapy are one component of a multi-disciplinary discharge planning process, led by the attending physician.  Recommendations may be updated based on patient status, additional functional criteria and insurance authorization. ? ?Follow Up Recommendations ? Skilled nursing-short term rehab (<3 hours/day) ?  ?  ?Assistance Recommended at Discharge Frequent or constant Supervision/Assistance  ?Patient can return home with the following Two people to help with walking and/or transfers;Two people to help with bathing/dressing/bathroom;Direct supervision/assist for medications management;Direct supervision/assist for financial management;Assist for transportation;Help with stairs or ramp for entrance;Assistance with cooking/housework;Assistance with feeding ?  ?Equipment Recommendations ? Wheelchair (measurements PT);Wheelchair cushion (measurements  PT);BSC/3in1;Other (comment)  ?  ?Recommendations for Other Services   ? ? ?  ?Precautions / Restrictions Precautions ?Precautions: Fall ?Restrictions ?Weight Bearing Restrictions: No  ?  ? ?Mobility ? Bed Mobility ?Overal bed mobility: Needs Assistance ?Bed Mobility: Supine to Sit, Rolling, Sit to Supine ?Rolling: Mod assist ?  ?Supine to sit: Mod assist ?Sit to supine: Max assist, +2 for physical assistance ?  ?General bed mobility comments: mod-max assist for trunk and LE management, boost up in bed upon return to supine. ?  ? ?Transfers ?Overall transfer level: Needs assistance ?  ?Transfers: Bed to chair/wheelchair/BSC ?  ?  ?  ?  ?  ? Lateral/Scoot Transfers: Mod assist, Max assist ?General transfer comment: mod assist for transfer from chair to bed towards L for trunk boost and hip placement on bed, pt motivated to return to bed so requires less physical assist. max assist for initial transfer to chair for cuing for attention to R, hip translation, boost back in chair. ?  ? ?Ambulation/Gait ?  ?  ?  ?  ?  ?  ?  ?  ? ? ?Stairs ?  ?  ?  ?  ?  ? ? ?Wheelchair Mobility ?  ? ?Modified Rankin (Stroke Patients Only) ?Modified Rankin (Stroke Patients Only) ?Pre-Morbid Rankin Score: No significant disability ?Modified Rankin: Severe disability ? ? ?  ?Balance Overall balance assessment: Needs assistance ?Sitting-balance support: Single extremity supported, Feet supported ?Sitting balance-Leahy Scale: Fair ?Sitting balance - Comments: periods of supervision ?Postural control: Posterior lean ?  ?  ?  ?  ?  ?  ?  ?  ?  ?  ?  ?  ?  ?  ?  ? ?  ?Cognition Arousal/Alertness: Awake/alert ?Behavior During Therapy: Restless ?Overall Cognitive Status: Impaired/Different from baseline ?Area of Impairment: Problem solving, Attention, Awareness, Following commands, Safety/judgement ?  ?  ?  ?  ?  ?  ?  ?  ?  ?  Current Attention Level: Focused ?  ?Following Commands: Follows one step commands inconsistently, Follows one step commands  with increased time ?Safety/Judgement: Decreased awareness of safety, Decreased awareness of deficits ?Awareness: Intellectual ?Problem Solving: Slow processing, Requires verbal cues, Requires tactile cues, Difficulty sequencing, Decreased initiation ?General Comments: Pt verbalizes yes/no during PT and SLP questions today, follows verbal commands very inconsistently so must use multimodal cuing. Benefits most from incorporating functional tasks (reaching for cup, moving to chair, laying down with touching pillow cue). Limited irritability. ?  ?  ? ?  ?Exercises   ? ?  ?General Comments   ?  ?  ? ?Pertinent Vitals/Pain Pain Assessment ?Pain Assessment: Faces ?Faces Pain Scale: No hurt ?Pain Intervention(s): Monitored during session  ? ? ?Home Living   ?  ?  ?  ?  ?  ?  ?  ?  ?  ?   ?  ?Prior Function    ?  ?  ?   ? ?PT Goals (current goals can now be found in the care plan section) Acute Rehab PT Goals ?Patient Stated Goal: unable to state ?PT Goal Formulation: Patient unable to participate in goal setting ?Time For Goal Achievement: 10/05/21 ?Potential to Achieve Goals: Good ?Progress towards PT goals: Progressing toward goals ? ?  ?Frequency ? ? ? Min 3X/week ? ? ? ?  ?PT Plan Current plan remains appropriate  ? ? ?Co-evaluation   ?  ?  ?  ?  ? ?  ?AM-PAC PT "6 Clicks" Mobility   ?Outcome Measure ? Help needed turning from your back to your side while in a flat bed without using bedrails?: A Lot ?Help needed moving from lying on your back to sitting on the side of a flat bed without using bedrails?: A Lot ?Help needed moving to and from a bed to a chair (including a wheelchair)?: Total ?Help needed standing up from a chair using your arms (e.g., wheelchair or bedside chair)?: Total ?Help needed to walk in hospital room?: Total ?Help needed climbing 3-5 steps with a railing? : Total ?6 Click Score: 8 ? ?  ?End of Session   ?Activity Tolerance: Patient limited by fatigue ?Patient left: in bed;with bed alarm set;with  call bell/phone within reach;with restraints reapplied (all rails up with waist belt restraint on, as pt was upon PT arrival) ?Nurse Communication: Mobility status ?PT Visit Diagnosis: Other abnormalities of gait and mobility (R26.89);Muscle weakness (generalized) (M62.81);Other symptoms and signs involving the nervous system (R29.898);Hemiplegia and hemiparesis ?Hemiplegia - Right/Left: Right ?Hemiplegia - dominant/non-dominant: Dominant ?Hemiplegia - caused by: Nontraumatic intracerebral hemorrhage ?  ? ? ?Time: 0981-1914 ?PT Time Calculation (min) (ACUTE ONLY): 30 min ? ?Charges:  $Therapeutic Activity: 8-22 mins ?$Neuromuscular Re-education: 8-22 mins          ?          ?Marye Round, PT DPT ?Acute Rehabilitation Services ?Pager [phone]  ?Office [phone] ? ? ? ?Jet Armbrust E Stroup ?09/30/2021, 4:23 PM ? ?

## 2021-09-30 NOTE — Progress Notes (Addendum)
Patient added to DTP list for discharge planning. ? ?Patient's LOG has been approved by [facility] leadership. ? ?CSW spoke with Teena at [facility] who states the facility is ready to accept the patient whenever he is medically stable. ? ?Edwin Dada, MSW, LCSW ?Transitions of Care  Clinical Social Worker II ?[phone] ? ?

## 2021-10-01 LAB — COMPREHENSIVE METABOLIC PANEL
ALT: 56 U/L — ABNORMAL HIGH (ref 0–44)
AST: 70 U/L — ABNORMAL HIGH (ref 15–41)
Albumin: 2.8 g/dL — ABNORMAL LOW (ref 3.5–5.0)
Alkaline Phosphatase: 144 U/L — ABNORMAL HIGH (ref 38–126)
Anion gap: 10 (ref 5–15)
BUN: 19 mg/dL (ref 6–20)
CO2: 26 mmol/L (ref 22–32)
Calcium: 9.6 mg/dL (ref 8.9–10.3)
Chloride: 100 mmol/L (ref 98–111)
Creatinine, Ser: 1.02 mg/dL (ref 0.61–1.24)
GFR, Estimated: >60 mL/min (ref >60)
Glucose, Bld: 106 mg/dL — ABNORMAL HIGH (ref 70–99)
Potassium: 3.8 mmol/L (ref 3.5–5.1)
Sodium: 136 mmol/L (ref 135–145)
Total Bilirubin: 0.8 mg/dL (ref 0.3–1.2)
Total Protein: 8.5 g/dL — ABNORMAL HIGH (ref 6.5–8.1)

## 2021-10-01 MED ORDER — AMLODIPINE BESYLATE 10 MG PO TABS: 10.0000 mg | ORAL_TABLET | Freq: Every day | ORAL | Status: DC

## 2021-10-01 MED ORDER — FOLIC ACID 1 MG PO TABS: 1.0000 mg | ORAL_TABLET | Freq: Every day | ORAL | Status: AC

## 2021-10-01 MED ORDER — ADULT MULTIVITAMIN W/MINERALS CH: 1.0000 | ORAL_TABLET | Freq: Every day | ORAL | Status: AC

## 2021-10-01 MED ORDER — POLYETHYLENE GLYCOL 3350 17 G PO PACK: 17.0000 g | PACK | Freq: Every day | ORAL | 0 refills | Status: AC | PRN

## 2021-10-01 MED ORDER — METHOCARBAMOL 750 MG PO TABS
750.0000 mg | ORAL_TABLET | Freq: Three times a day (TID) | ORAL | Status: AC
Start: 2021-10-01 — End: ?

## 2021-10-01 MED ORDER — ASCORBIC ACID 500 MG PO TABS: 500.0000 mg | ORAL_TABLET | Freq: Two times a day (BID) | ORAL | Status: DC

## 2021-10-01 MED ORDER — THIAMINE HCL 100 MG PO TABS: 100.0000 mg | ORAL_TABLET | Freq: Every day | ORAL | Status: AC

## 2021-10-01 MED ORDER — OLANZAPINE 2.5 MG PO TABS
2.5000 mg | ORAL_TABLET | Freq: Every day | ORAL | Status: AC
Start: 2021-10-01 — End: ?

## 2021-10-01 MED ORDER — LORAZEPAM 0.5 MG PO TABS
0.5000 mg | ORAL_TABLET | Freq: Two times a day (BID) | ORAL | 0 refills | Status: AC
Start: 2021-10-01 — End: ?

## 2021-10-01 MED ORDER — METOPROLOL TARTRATE 25 MG PO TABS: 25.0000 mg | ORAL_TABLET | Freq: Two times a day (BID) | ORAL | Status: DC

## 2021-10-01 MED ORDER — POTASSIUM CHLORIDE 20 MEQ PO PACK: 40.0000 meq | PACK | Freq: Every day | ORAL | Status: DC

## 2021-10-01 MED ORDER — PANTOPRAZOLE SODIUM 40 MG PO TBEC: 40.0000 mg | DELAYED_RELEASE_TABLET | Freq: Every day | ORAL | Status: AC

## 2021-10-01 MED ORDER — VITAMIN D3 25 MCG PO TABS: 1000.0000 [IU] | ORAL_TABLET | Freq: Every day | ORAL | Status: DC

## 2021-10-01 MED ORDER — OLANZAPINE 5 MG PO TABS: 5.0000 mg | ORAL_TABLET | Freq: Every day | ORAL | Status: AC

## 2021-10-01 MED ORDER — ACETAMINOPHEN 325 MG PO TABS: 650.0000 mg | ORAL_TABLET | ORAL | Status: DC | PRN

## 2021-10-01 NOTE — Progress Notes (Signed)
Pt with discharge orders, discharge paperwork placed in packet for receiving facility. IV's removed. Report called to [place] at [facility]. Pt picked up and transported via [facility].   ?

## 2021-10-01 NOTE — Discharge Summary (Signed)
?Physician Discharge Summary ?  ?Patient: Joshua Price MRN: [id number] DOB: 1961/06/05  ?Admit date:     09/05/2021  ?Discharge date: 10/01/21  ?Discharge Physician: Junious Silk  ? ?PCP: Dartha Lodge, FNP  ? ?Recommendations at discharge:  ? ?Patient will discharge to [facility] ?After discharge from skilled nursing facility patient will need to follow-up with [facility] at the stroke clinic.  If patient remains in custodial care he will need to follow-up within 1 month after admission to the skilled nursing facility ?Patient will need to continue regular PT and OT as well as speech therapy for swallowing and cognition and linguistics. ?Patient on a dysphagia 1 diet with nectar thick liquids ?Patient has been started on Zyprexa. During the hospitalization he has had mild transaminitis without elevation in total bilirubin.  This was discussed with the pharmacist who stated low-dose Zyprexa was appropriate in this situation.  LFTs were checked prior to discharge.  AST  , ALT  .  Recommend checking LFTs 2 weeks after arrival. ?QTc was 454 ms on 3/22.  Recommend checking QTc/EKG in 2 weeks after arrival. ? ?Discharge Diagnoses: ?Principal Problem: ?  ICH (intracerebral hemorrhage) (HCC) ?Active Problems: ?  Essential hypertension ?  Malnutrition of moderate degree ?  Alcohol withdrawal (HCC) ?  Hypokalemia ?  Hypernatremia ?  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  Aspiration pneumonia (HCC) ?  Hyperglycemia ? ?Resolved Problems: ?  * No resolved hospital problems. * ? ?Hospital Course: ?61 y.o. male with history of HTN, smoking, esophageal varcies presenting after he was found down on the concrete porch of his home.  He has been on the phone speaking with a relative and she heard him fall and he was able to state that he was on the ground and could not get up family member stated that this happened at 1645 on 2/25 and the son was able to get there within 5  minutes, he then called EMS.  On arrival he had decreased alertness, right hemineglect, dysarthric speech.  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  He was admitted to the neuro ICU.  CT scans were repeated which showed stable findings.   Stay complicated by hypernatremia and aspiration pna.  Continues to have significant dysarthria.  Placement issue.  Plan for skilled nursing facility placement.  TOC following ?  ? ?  ? 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  Associated cerebral edema treated with hypertonic saline-repeat CT stable and stable midline shift ?-Has right hemiplegia, severe dysarthria ?-Noted with hyper tonicity of RLE - 3/22 Robaxin 750 milligram TID initiated ?-Continue low-dose Ativan BID and low-dose Zyprexa BID to treat behavioral and cognitive sequela of brain injury from severe stroke. ?Patient with mild elevation in AST and ALT.  Discussed with pharmacist who stated low-dose Zyprexa appropriate in this setting. ?-EKG completed on 3/22 and QTc 454 ms. ?-LFTs on date of discharge: AST 70 and ALT 56 which are stable in comparison with recent LFTs ?- Prognosis is poor- patient still with functional deficits.  Goals of care was discussed with family by palliative care.  Remains full code, continue full scope of treatment for now. ? ?Hyperglycemia ?No history of diabetes, hemoglobin A1c within normal ranges and CBGs well below 200 range ?Patient with ongoing agitation and no indication to continue regular CBG checks therefore to minimize agitation will discontinue CBG checks as well  as sliding scale insulin ? ?Aspiration pneumonia (HCC) ?Patient developed fever on 3/3.  UA was unremarkable and not suggestive of infection.  Chest x-ray showed left lower lung infiltrate.  Patient with significant oropharyngeal dysphagia.  Likely aspirated.  Started on  Augmentin.   ?-s/p 5-day course. ?-He is currently on room air ?-On dysphagia 1 diet.  Speech therapy following ? ?Oropharyngeal dysphagia/cognitive impairment secondary to severe stroke ?On dysphagia diet but given cognitive impairments as well as upper extremity hemiplegia requires total feed ?Continue aspiration precautions-during most recent session 3/22 patient was able to tolerate thin liquids via cup without overt signs of aspiration.  He had impulsive tendencies while eating with consistent consuming of 3-4 consecutive swallows and right sided anterior spillage. ?In regards to cognitive impairment verbal output improved but still severely impaired.  Inconsistently responding to yes/no questions but articulation of yes and set were adequate.  Was able to follow one-step commands within functional context with about 50% accuracy given repetition as well as verbal and tactile prompts.  Inconsistency attending to therapist with tactile cues.   ? ?Hypernatremia ?resolved ? ? ?Hypokalemia ?Continue daily supplementation, monitor levels ? ?Alcohol withdrawal (HCC) ?Resolved ?Continue thiamine, multivitamins, folic acid. ? ?Malnutrition of moderate degree ?Nutrition Status: ?Nutrition Problem: Moderate Malnutrition ?Etiology: social / environmental circumstances (EtOH abuse) ?Signs/Symptoms: mild fat depletion, moderate fat depletion, mild muscle depletion, severe muscle depletion ?Interventions: Prostat, Tube feeding, MVI ?Initially required tube feeding but now on oral diet ? ? ?Essential hypertension ?Presented with hypertensive emergency.  Currently on amlodipine and metoprolol.  Blood pressure has improved.  Continue current treatment. ? ?Altered mental status-resolved as of 09/30/2021 ?-sequelae of ICH   ?-not sure how much patient will improve ? ? ? ? ? ?  ? ? ?Consultants:  ?PCCM ?Palliative Medicine ? ?Procedures performed:  ?Echocardiogram ?Cortrack tube  ? ?Disposition:  ?Skilled nursing facility ? ?Diet  recommendation:  ?Dysphagia type 1   Liquid nectar thick ? ?DISCHARGE MEDICATION: ?Allergies as of 10/01/2021   ? ?   Reactions  ? Lisinopril Swelling  ? ?  ? ?  ?Medication List  ?  ? ?TAKE these medications   ? ?acetaminophen 325 MG tablet ?Commonly known as: TYLENOL ?Take 2 tablets (650 mg total) by mouth every 4 (four) hours as needed for mild pain (or temp > 37.5 C (99.5 F)). ?  ?amLODipine 10 MG tablet ?Commonly known as: NORVASC ?Take 1 tablet (10 mg total) by mouth daily. ?What changed:  ?medication strength ?how much to take ?  ?ascorbic acid 500 MG tablet ?Commonly known as: VITAMIN C ?Take 1 tablet (500 mg total) by mouth 2 (two) times daily. ?  ?folic acid 1 MG tablet ?Commonly known as: FOLVITE ?Take 1 tablet (1 mg total) by mouth daily. ?  ?LORazepam 0.5 MG tablet ?Commonly known as: ATIVAN ?Take 1 tablet (0.5 mg total) by mouth 2 (two) times daily. ?  ?methocarbamol 750 MG tablet ?Commonly known as: ROBAXIN ?Take 1 tablet (750 mg total) by mouth 3 (three) times daily. ?  ?metoprolol tartrate 25 MG tablet ?Commonly known as: LOPRESSOR ?Take 1 tablet (25 mg total) by mouth 2 (two) times daily. ?  ?multivitamin with minerals Tabs tablet ?Take 1 tablet by mouth daily. ?  ?OLANZapine 2.5 MG tablet ?Commonly known as: ZYPREXA ?Take 1 tablet (2.5 mg total) by mouth daily. ?  ?OLANZapine 5 MG tablet ?Commonly known as: ZYPREXA ?Take 1 tablet (5 mg total) by mouth at bedtime. ?  ?pantoprazole 40 MG  tablet ?Commonly known as: PROTONIX ?Take 1 tablet (40 mg total) by mouth at bedtime. ?  ?polyethylene glycol 17 g packet ?Commonly known as: MIRALAX / GLYCOLAX ?Place 17 g into feeding tube daily as needed for moderate constipation. ?  ?potassium chloride 20 MEQ packet ?Commonly known as: KLOR-CON ?Take 40 mEq by mouth daily. ?  ?thiamine 100 MG tablet ?Take 1 tablet (100 mg total) by mouth daily. ?  ?Vitamin D3 25 MCG tablet ?Commonly known as: Vitamin D ?Take 1 tablet (1,000 Units total) by mouth daily. ?  ? ?   ? ? Follow-up Information   ? ? [facility]. Schedule an appointment as soon as possible for a visit in 1 month(s).   ?Specialty: Neurology ?Why: stroke clinic ?Contact information: ?

## 2021-10-01 NOTE — Progress Notes (Signed)
Pt. Rested comfortably all night. No PRNs needed, only scheduled Zyprexa and Ativan given.  ?

## 2021-10-01 NOTE — Progress Notes (Signed)
Attempted to call report to [facility] at [phone] placed on hold, no one picked up.  ?

## 2021-10-01 NOTE — Progress Notes (Signed)
Patient will discharge to [facility] today via [facility] - transportation has been called for pick up at 2pm. Patient will go to room 126B. The number to call for report is [phone]. Discharge packet completed and at nurse's station. ? ?RN aware of discharge plan. ? ?Edwin Dada, MSW, LCSW ?Transitions of Care  Clinical Social Worker II ?[phone] ? ?

## 2021-10-04 ENCOUNTER — Emergency Department (HOSPITAL_COMMUNITY): Payer: Medicaid Other

## 2021-10-04 ENCOUNTER — Encounter (HOSPITAL_COMMUNITY): Payer: Self-pay

## 2021-10-04 ENCOUNTER — Emergency Department (HOSPITAL_COMMUNITY)
Admission: EM | Admit: 2021-10-04 | Discharge: 2021-10-05 | Disposition: A | Discharge status: Home / Self Care | Payer: Medicaid Other | Attending: Emergency Medicine | Admitting: Emergency Medicine

## 2021-10-04 ENCOUNTER — Other Ambulatory Visit: Payer: Self-pay

## 2021-10-04 DIAGNOSIS — Z79899 Other long term (current) drug therapy: Secondary | ICD-10-CM | POA: Insufficient documentation

## 2021-10-04 DIAGNOSIS — K08109 Complete loss of teeth, unspecified cause, unspecified class: Secondary | ICD-10-CM | POA: Insufficient documentation

## 2021-10-04 DIAGNOSIS — R531 Weakness: Secondary | ICD-10-CM | POA: Insufficient documentation

## 2021-10-04 DIAGNOSIS — E871 Hypo-osmolality and hyponatremia: Secondary | ICD-10-CM | POA: Insufficient documentation

## 2021-10-04 DIAGNOSIS — S032XXA Dislocation of tooth, initial encounter: Secondary | ICD-10-CM | POA: Diagnosis not present

## 2021-10-04 DIAGNOSIS — I10 Essential (primary) hypertension: Secondary | ICD-10-CM | POA: Insufficient documentation

## 2021-10-04 DIAGNOSIS — W19XXXA Unspecified fall, initial encounter: Secondary | ICD-10-CM | POA: Diagnosis not present

## 2021-10-04 DIAGNOSIS — S0993XA Unspecified injury of face, initial encounter: Secondary | ICD-10-CM | POA: Diagnosis present

## 2021-10-04 DIAGNOSIS — R739 Hyperglycemia, unspecified: Secondary | ICD-10-CM | POA: Insufficient documentation

## 2021-10-04 HISTORY — DX: Unspecified protein-calorie malnutrition: E46

## 2021-10-04 HISTORY — DX: Essential (primary) hypertension: I10

## 2021-10-04 HISTORY — DX: Hypokalemia: E87.6

## 2021-10-04 HISTORY — DX: Cerebral edema: G93.6

## 2021-10-04 HISTORY — DX: Dysphagia, unspecified: R13.10

## 2021-10-04 HISTORY — DX: Hyperglycemia, unspecified: R73.9

## 2021-10-04 LAB — CBC WITH DIFFERENTIAL/PLATELET
Abs Immature Granulocytes: 0.05 10*3/uL (ref 0.00–0.07)
Basophils Absolute: 0 10*3/uL (ref 0.0–0.1)
Basophils Relative: 0 %
Eosinophils Absolute: 0.1 10*3/uL (ref 0.0–0.5)
Eosinophils Relative: 2 %
HCT: 35.1 % — ABNORMAL LOW (ref 39.0–52.0)
Hemoglobin: 11.6 g/dL — ABNORMAL LOW (ref 13.0–17.0)
Immature Granulocytes: 1 %
Lymphocytes Relative: 29 %
Lymphs Abs: 2.2 10*3/uL (ref 0.7–4.0)
MCH: 31.4 pg (ref 26.0–34.0)
MCHC: 33 g/dL (ref 30.0–36.0)
MCV: 95.1 fL (ref 80.0–100.0)
Monocytes Absolute: 0.5 10*3/uL (ref 0.1–1.0)
Monocytes Relative: 7 %
Neutro Abs: 4.7 10*3/uL (ref 1.7–7.7)
Neutrophils Relative %: 61 %
Platelets: 230 10*3/uL (ref 150–400)
RBC: 3.69 MIL/uL — ABNORMAL LOW (ref 4.22–5.81)
RDW: 12.2 % (ref 11.5–15.5)
WBC: 7.7 10*3/uL (ref 4.0–10.5)
nRBC: 0 % (ref 0.0–0.2)

## 2021-10-04 LAB — BASIC METABOLIC PANEL
Anion gap: 11 (ref 5–15)
BUN: 20 mg/dL (ref 8–23)
CO2: 23 mmol/L (ref 22–32)
Calcium: 9.7 mg/dL (ref 8.9–10.3)
Chloride: 99 mmol/L (ref 98–111)
Creatinine, Ser: 1.08 mg/dL (ref 0.61–1.24)
GFR, Estimated: >60 mL/min (ref >60)
Glucose, Bld: 103 mg/dL — ABNORMAL HIGH (ref 70–99)
Potassium: 4.4 mmol/L (ref 3.5–5.1)
Sodium: 133 mmol/L — ABNORMAL LOW (ref 135–145)

## 2021-10-04 NOTE — ED Provider Notes (Addendum)
?[facility] ?Provider Note ? ? ?CSN: [id number] ?Arrival date & time: 10/04/21  2008 ? ?  ? ?History ? ?No chief complaint on file. ? ? ?Joshua Price is a 61 y.o. male. ? ?61 year old male with with past medical history of intracerebral hemorrhage, CVA, dysphagia/cognitive impairment who was recently discharged from the hospital following [place] and stroke on 3/23 to [facility].  Patient presents today because he had a fall and had tooth avulsion.  This was an unwitnessed fall.  Patient has unintelligible speech.  Unsure if this was syncopal episode.  Rehab facility unable to provide any information other than patient was found down and had tooth avulsion. ? ?The history is provided by medical records. No language interpreter was used.  ? ?  ? ?Home Medications ?Prior to Admission medications   ?Medication Sig Start Date End Date Taking? Authorizing Provider  ?acetaminophen (TYLENOL) 325 MG tablet Take 2 tablets (650 mg total) by mouth every 4 (four) hours as needed for mild pain (or temp > 37.5 C (99.5 F)). 10/01/21   Samella Parr, NP  ?amLODipine (NORVASC) 10 MG tablet Take 1 tablet (10 mg total) by mouth daily. 10/01/21   Samella Parr, NP  ?ascorbic acid (VITAMIN C) 500 MG tablet Take 1 tablet (500 mg total) by mouth 2 (two) times daily. 10/01/21   Samella Parr, NP  ?cholecalciferol (VITAMIN D) 25 MCG tablet Take 1 tablet (1,000 Units total) by mouth daily. 10/01/21   Samella Parr, NP  ?folic acid (FOLVITE) 1 MG tablet Take 1 tablet (1 mg total) by mouth daily. 10/01/21   Samella Parr, NP  ?LORazepam (ATIVAN) 0.5 MG tablet Take 1 tablet (0.5 mg total) by mouth 2 (two) times daily. 10/01/21   Samella Parr, NP  ?methocarbamol (ROBAXIN) 750 MG tablet Take 1 tablet (750 mg total) by mouth 3 (three) times daily. 10/01/21   Samella Parr, NP  ?metoprolol tartrate (LOPRESSOR) 25 MG tablet Take 1 tablet (25 mg total) by mouth 2 (two) times daily.  10/01/21   Samella Parr, NP  ?Multiple Vitamin (MULTIVITAMIN WITH MINERALS) TABS tablet Take 1 tablet by mouth daily. 10/01/21   Samella Parr, NP  ?OLANZapine (ZYPREXA) 2.5 MG tablet Take 1 tablet (2.5 mg total) by mouth daily. 10/01/21   Samella Parr, NP  ?OLANZapine (ZYPREXA) 5 MG tablet Take 1 tablet (5 mg total) by mouth at bedtime. 10/01/21   Samella Parr, NP  ?pantoprazole (PROTONIX) 40 MG tablet Take 1 tablet (40 mg total) by mouth at bedtime. 10/01/21   Samella Parr, NP  ?polyethylene glycol (MIRALAX / GLYCOLAX) 17 g packet Place 17 g into feeding tube daily as needed for moderate constipation. 10/01/21   Samella Parr, NP  ?potassium chloride (KLOR-CON) 20 MEQ packet Take 40 mEq by mouth daily. 10/01/21   Samella Parr, NP  ?thiamine 100 MG tablet Take 1 tablet (100 mg total) by mouth daily. 10/01/21   Samella Parr, NP  ?   ? ?Allergies    ?Lisinopril   ? ?Review of Systems   ?Review of Systems  ?Unable to perform ROS: Other  ? ?Physical Exam ?Updated Vital Signs ?BP 121/82 (BP Location: Right Arm)   Pulse 80   Temp 99.3 ?F (37.4 ?C) (Oral)   Resp 16   Ht 5\' 6"  (1.676 m)   Wt 70.5 kg   SpO2 96%   BMI 25.09 kg/m?  ?Physical  Exam ?Vitals and nursing note reviewed.  ?Constitutional:   ?   General: He is not in acute distress. ?   Appearance: Normal appearance. He is not ill-appearing.  ?HENT:  ?   Head: Normocephalic and atraumatic.  ?   Nose: Nose normal.  ?   Mouth/Throat:  ?   Comments: Front left incisor avulsed from the base. Poor overall dentition.  Multiple missing teeth. ?Eyes:  ?   Conjunctiva/sclera: Conjunctivae normal.  ?Cardiovascular:  ?   Rate and Rhythm: Normal rate and regular rhythm.  ?Pulmonary:  ?   Effort: Pulmonary effort is normal. No respiratory distress.  ?   Breath sounds: Normal breath sounds. No wheezing.  ?Abdominal:  ?   General: There is no distension.  ?   Palpations: Abdomen is soft.  ?   Tenderness: There is no abdominal tenderness. There is no  guarding.  ?Musculoskeletal:     ?   General: No deformity.  ?   Right lower leg: No edema.  ?   Left lower leg: No edema.  ?   Comments: Right-sided weakness in upper and lower extremities secondary to his CVA.  Without apparent joint tenderness.  ?Skin: ?   Findings: No rash.  ?Neurological:  ?   Mental Status: He is alert.  ? ? ?ED Results / Procedures / Treatments   ?Labs ?(all labs ordered are listed, but only abnormal results are displayed) ?Labs Reviewed - No data to display ? ?EKG ?None ? ?Radiology ?No results found. ? ?Procedures ?Procedures  ? ? ?Medications Ordered in ED ?Medications - No data to display ? ?ED Course/ Medical Decision Making/ A&P ?  ?                        ?Medical Decision Making ?Amount and/or Complexity of Data Reviewed ?Labs: ordered. ?Radiology: ordered. ? ? ?Medical Decision Making / ED Course ? ? ?This patient presents to the ED for concern of fall, tooth avulsion, this involves an extensive number of treatment options, and is a complaint that carries with it a high risk of complications and morbidity.  The differential diagnosis includes acute CVA, syncope, acute intracranial bleed, tooth avulsion, other fracture of the maxillofacial bones, hip fracture ? ?MDM: ?61 year old male with past medical history of recent CVA, intracerebral hemorrhage, with intelligible speech presents today for evaluation of fall, tooth avulsion.  This was an unwitnessed fall.  Patient is not able to provide much history.  Tooth is at bedside and was avulsed 1 hour prior to arrival.  His joints are without tenderness to palpation on exam.  He has right-sided deficits and does not move his right upper extremity or right lower extremity.  Hips are without tenderness to palpation.  Patient is able answer yes or no questions.  Front left incisor avulsed.  Will evaluate with basic blood work, EKG, chest x-ray, hip x-ray. ?CBC without leukocytosis.  Hemoglobin of 11.6 which is around his baseline.  CBC  otherwise unremarkable.  BMP with sodium of 133 glucose of 103 otherwise unremarkable.  CT maxillofacial shows absence of left maxillary incisor.  CT head shows interval decrease in the size of intraparenchymal hemorrhage however it shows a hyperdense hemorrhage that is suspected to represent acute on chronic hemorrhage.  Does not show significant mass effect and resolution of midline shift.  Hip x-rays negative.  Chest x-ray is negative for acute findings.  Cervical spine CT scan negative for acute findings. ?CT head finding  discussed with neurosurgery however they request that we talk with neurology service.  Case discussed with Dr. Horatio Pel who will review CT imaging and provide his recommendations.  However I suspect given concern for acute hemorrhage that patient will need to be admitted for observation and monitoring of CT scan. ?Discussed with dentist who recommends against replacing tooth given patient's age, comorbidities and increase risk for necrosis.  ?At the time of signout discussion had with neurology who states that the CT scan is consistent with a resolving bleed that occurred previously and that this is not a rebleed.  Neurology recommends patient have work-up for metabolic encephalopathy and admission for possible inpatient rehab. ? ? ?Additional history obtained: ?-Additional history obtained from recent admission notes confirming that patient recently had intraparenchymal hemorrhage with dysphagia, right-sided deficits along with intelligible speech. ?-External records from outside source obtained and reviewed including: Chart review including previous notes, labs, imaging, consultation notes ? ? ?Lab Tests: ?-I ordered, reviewed, and interpreted labs.   ?The pertinent results include:   ?Labs Reviewed  ?CBC WITH DIFFERENTIAL/PLATELET - Abnormal; Notable for the following components:  ?    Result Value  ? RBC 3.69 (*)   ? Hemoglobin 11.6 (*)   ? HCT 35.1 (*)   ? All other components within normal  limits  ?BASIC METABOLIC PANEL - Abnormal; Notable for the following components:  ? Sodium 133 (*)   ? Glucose, Bld 103 (*)   ? All other components within normal limits  ?  ? ? ?EKG ? EKG Interpretation ? ?Date/Time:

## 2021-10-04 NOTE — ED Triage Notes (Addendum)
PT BIB [facility] from [facility]. Pt has had 2 falls today and has a tooth avulsion. No LOC reported. Pt not on anticoagulants. EMS has tooth in saline. Tooth came out approx 1 hour PTA. Pt has hx of CVA with R sided deficit at baseline.  ?CBG with EMS was 131.  ?

## 2021-10-05 NOTE — ED Provider Notes (Signed)
00:10: Assumed care of patient from [place] @ shift change pending consult for admission.  ? ?Please see prior provider note for full H&P.  Briefly patient is a 61 year old male with recent admission 02/25 through 03/23 for intracerebral hemorrhage with subsequent right-sided deficits and dysphagia, he was discharged to a rehab facility on the 23rd, he has had some falls from bed since, he is bedbound due to his neurologic deficits, no acute change in neurologic deficits since recent hospital discharge.  ? ?CT head today with mention of acute versus subacute on chronic hemorrhage.  ?Case was discussed with neurologist Dr. Cheral Marker by afternoon team- Dr. Cheral Marker has personally reviewed imaging and place note in patient's chart as below:  ? ?"CT head official Radiology interpretation: Interval decrease in the size of an intraparenchymal hemorrhage centered in the left basal ganglia, with an area of hyperdense hemorrhage that is suspected to represent acute or subacute on chronic hemorrhage. No significant mass effect and resolution of previously noted midline shift. ?  ?I have personally reviewed the images for second opinion at the request of the ED Physician caring for this patient. The central hyperdensity within the bed of the now late subacute hemorrhage is most consistent with residual blood from the prior hemorrhage, rather than rebleeding. The findings represent expected interval changes according to the typical natural evolution of intracranial hemorrhages. Current and most recent scans compared side by side for all image slices. " ? ?There was initially some discussion between afternoon ED team and Neurology team regarding admission of this patient for inpatient rehab, however upon my review of the records patient was discharged to SNF- [facility] 10/01/21. ? ?I personally discussed with neurologist Dr. Cheral Marker, relayed head CT findings represent expected interval changes according to the typical  natural evolution of intracranial hemorrhages, reports in terms of head CT findings today no need to observe in the hospital due to this specifically. We discussed that patient is currently in a rehab facility. Will plan to discharge patient back to his rehab facility. ? ?Findings and plan of care discussed with supervising physician Dr. Leonette Monarch who is in agreement.  ? ? ? ?  ?Amaryllis Dyke, PA-C ?10/05/21 0515 ? ?  ?Fatima Blank, MD ?10/05/21 [phone] ? ?

## 2021-10-05 NOTE — Discharge Instructions (Addendum)
You were seen in the emergency department today after a fall.  Your blood work was overall similar to prior showing mild anemia.  Your imaging did not show any new fractures.  Your CT of your head did show findings of your recent intracranial bleed, the images were reviewed with a neurologist who felt there was no new bleeding injuries.  ? ? ?Please follow-up with primary care soon as possible.  Continue close monitoring at rehab.  Return to the emergency department for new or worsening symptoms or any other concerns. ?

## 2021-10-05 NOTE — Progress Notes (Signed)
CT head official Radiology interpretation: Interval decrease in the size of an intraparenchymal hemorrhage centered in the left basal ganglia, with an area of hyperdense hemorrhage that is suspected to represent acute or subacute on chronic hemorrhage. No significant mass effect and resolution of previously noted midline shift. ? ?I have personally reviewed the images for second opinion at the request of the ED Physician caring for this patient. The central hyperdensity within the bed of the now late subacute hemorrhage is most consistent with residual blood from the prior hemorrhage, rather than rebleeding. The findings represent expected interval changes according to the typical natural evolution of intracranial hemorrhages. Current and most recent scans compared side by side for all image slices.  ? ?Electronically signed: Dr. Caryl Pina ? ? ? ?

## 2021-10-05 NOTE — ED Notes (Signed)
Called [facility] to transport back to [facility] ?

## 2021-10-05 NOTE — ED Notes (Signed)
[facility] was called to transport the pt back to the nurwsing home ?

## 2021-11-24 ENCOUNTER — Encounter: Payer: Self-pay | Admitting: Neurology

## 2021-11-24 ENCOUNTER — Ambulatory Visit: Payer: Self-pay | Admitting: Neurology

## 2021-11-24 VITALS — BP 142/82 | HR 77

## 2021-11-24 DIAGNOSIS — I10 Essential (primary) hypertension: Secondary | ICD-10-CM

## 2021-11-24 DIAGNOSIS — I610 Nontraumatic intracerebral hemorrhage in hemisphere, subcortical: Secondary | ICD-10-CM

## 2021-11-24 NOTE — Patient Instructions (Signed)
We discussed it is very important to take your medications as prescribed daily, they are very important to prevent another stroke ? ?Goals of secondary stroke prevention are to keep blood pressure less than 130/80, LDL less than 70, A1c less than 7 ? ?Consider being involved in physical therapy ? ?At next lab check, have a lipid panel drawn, consider addition of statin medicine if indicated ? ?Follow-up at our office on an as-needed basis ?

## 2021-11-24 NOTE — Progress Notes (Signed)
? ? ?Patient: Joshua Price ?Date of Birth: 06/26/1961 ? ?Reason for Visit: Stroke Clinic Follow-up from event 09/05/21 ?History from: Patient, alone ?Primary Neurologist: Saw Dr. Erlinda Hong ? ?ASSESSMENT AND PLAN ?61 y.o. year old male  ? ?ICH: Left BG ICH, likely secondary to hypertension ?-Deficits are significant, has right hemiplegia and global aphasia ?-No antithrombotic due to IPH ?-Encouraged him to participate in PT/ST/OT at SNF ? ?Hypertension ?-Long-term goal normotensive ?-On Norvasc, encouraged to take medication ?-Educated ICH likely secondary to hypertension ? ?3.  Hyperlipidemia ?-LDL 94, goal less than 70 ?-Recommend repeat lipid panel at next lab check, consider addition of statin if not at goal ? ?4.  Alcohol abuse ?-Claims no longer drinking ? ?5.  Tobacco abuse ?-Claims no longer smoking since at SNF ? ?HISTORY OF PRESENT ILLNESS: ?Today 11/24/21 ? Joshua Price is here for stroke clinic follow-up following left BG ICH, likely secondary to HTN. Was on the phone with family when he fell to the ground, was able to say he needed help, his son arrived within 5 minutes, called EMS. He had decreased alertness, right hemineglect, dysarthric speech. STAT CT head showed acute left basal ganglia hemorrhage.  Treated with hypertonic saline for cerebral edema and mass effect.  His hospitalization was complicated by hypernatremia and aspiration pneumonia.  Started Robaxin for increased tone to right upper extremity.  Started on Ativan and Zyprexa secondary to cognitive and behavioral issues from the stroke.  Was discharged to SNF. Back in ER after fall at SNF,  repeat CT head 10/05/2021 showed no new signs of rebleeding, more consistent with typical natural evolution of ICH. ? ?He is here today alone for office visit.  History and office visit are difficult due to global aphasia.  I called the SNF.  They report he refuses to take his medications for the last 3 weeks, refuses to participate in therapy.  He is a fall  risk, tries to do things on his own.  He has family support with his children, mother, friends.  He is on thin liquids, mechanical soft diet. They crush his pills. He has a NP that follows him at the facility. ? ?-CT head showed acute parenchymal hemorrhage involving left lentiform nucleus and surrounding white matter.  Mild mass effect. ?-Repeat CT head showed interval increase in the size of intraparenchymal hematoma centered in the left basal ganglia, with increased associated edema and 4 mm of left-to-right midline shift ?-MRI showed intraparenchymal hematoma centered in the left basal ganglia, increase in size compared to original study at 6 PM on 09/05/2021.  Slightly increasing mass effect on the left lateral ventricle and 4 mm of left-to-right midline shift ?-Repeat CT head 09/07/2021 showed stable IPH and midline shift ?-CTA 3//4/23 showed stable ICH, no significant stenosis or aneurysm ?-2D echo EF 70 to [id number], grade 1 diastolic dysfunction ?-LDL 94 ?-A1c 5.3 ?-No antithrombotic prior to admission, now no antithrombotic due to IPH ? ?HISTORY ?Copied 09/05/21 Dr. Cheral Marker HPI: Joshua Price is an 61 y.o. male with a history of HTN, smoking and esophageal varices presenting to the ED via EMS as a Code Stroke after he was found down and unable to get up on the concrete porch of his home. He had been speaking to a relative over the phone when she heard some chaotic sounding noise immediately after which he said he was on the ground and could not get up. Family member noted this to have occurred at 4:45 PM. She called the patient's son who arrived  about 5 minutes later to see his father on his right side on the hard concrete unable to get up. He called EMS and they placed the patient in a c-collar before bringing him to the ED. EMS noted right sided weakness and incoherent speech. Vitals per EMS: BP-218/134, HR-76, RR-18, 96% RA, CBG-103. ?  ?STAT CT head in the ED revealed an acute left basal ganglia hemorrhage.   ? ?REVIEW OF SYSTEMS: Out of a complete 14 system review of symptoms, the patient complains only of the following symptoms, and all other reviewed systems are negative. ? ?See HPI ? ?ALLERGIES: ?Allergies  ?Allergen Reactions  ? Lisinopril Swelling  ? ? ?HOME MEDICATIONS: ?Outpatient Medications Prior to Visit  ?Medication Sig Dispense Refill  ? cholecalciferol (VITAMIN D) 25 MCG tablet Take 1 tablet (1,000 Units total) by mouth daily.    ? folic acid (FOLVITE) 1 MG tablet Take 1 tablet (1 mg total) by mouth daily.    ? LORazepam (ATIVAN) 0.5 MG tablet Take 1 tablet (0.5 mg total) by mouth 2 (two) times daily. 30 tablet 0  ? methocarbamol (ROBAXIN) 750 MG tablet Take 1 tablet (750 mg total) by mouth 3 (three) times daily.    ? metoprolol tartrate (LOPRESSOR) 25 MG tablet Take 1 tablet (25 mg total) by mouth 2 (two) times daily.    ? Multiple Vitamin (MULTIVITAMIN WITH MINERALS) TABS tablet Take 1 tablet by mouth daily.    ? OLANZapine (ZYPREXA) 2.5 MG tablet Take 1 tablet (2.5 mg total) by mouth daily.    ? OLANZapine (ZYPREXA) 5 MG tablet Take 1 tablet (5 mg total) by mouth at bedtime.    ? pantoprazole (PROTONIX) 40 MG tablet Take 1 tablet (40 mg total) by mouth at bedtime.    ? polyethylene glycol (MIRALAX / GLYCOLAX) 17 g packet Place 17 g into feeding tube daily as needed for moderate constipation. 14 each 0  ? potassium chloride (KLOR-CON) 20 MEQ packet Take 40 mEq by mouth daily.    ? thiamine 100 MG tablet Take 1 tablet (100 mg total) by mouth daily.    ? acetaminophen (TYLENOL) 325 MG tablet Take 2 tablets (650 mg total) by mouth every 4 (four) hours as needed for mild pain (or temp > 37.5 C (99.5 F)).    ? amLODipine (NORVASC) 10 MG tablet Take 1 tablet (10 mg total) by mouth daily.    ? ascorbic acid (VITAMIN C) 500 MG tablet Take 1 tablet (500 mg total) by mouth 2 (two) times daily.    ? ?No facility-administered medications prior to visit.  ? ? ?PAST MEDICAL HISTORY: ?Past Medical History:  ?Diagnosis  Date  ? Cerebral edema (HCC)   ? Dysphagia   ? Esophageal varices (HCC)   ? Hyperglycemia   ? Hypertension   ? Hypokalemia   ? Malnutrition ([place])   ? ? ?PAST SURGICAL HISTORY: ?No past surgical history on file. ? ?FAMILY HISTORY: ?Family History  ?Problem Relation Age of Onset  ? Cancer Sister   ? Hypertension Sister   ? Hypertension Mother   ? ? ?SOCIAL HISTORY: ?Social History  ? ?Socioeconomic History  ? Marital status: Single  ?  Spouse name: Not on file  ? Number of children: Not on file  ? Years of education: Not on file  ? Highest education level: Not on file  ?Occupational History  ? Not on file  ?Tobacco Use  ? Smoking status: Former  ?  Packs/day: 0.25  ?  Types:  Cigarettes  ? Smokeless tobacco: Never  ?Vaping Use  ? Vaping Use: Never used  ?Substance and Sexual Activity  ? Alcohol use: Not Currently  ? Drug use: No  ? Sexual activity: Not Currently  ?  Birth control/protection: None  ?Other Topics Concern  ? Not on file  ?Social History Narrative  ? Not on file  ? ?Social Determinants of Health  ? ?Financial Resource Strain: Not on file  ?Food Insecurity: Not on file  ?Transportation Needs: Not on file  ?Physical Activity: Not on file  ?Stress: Not on file  ?Social Connections: Not on file  ?Intimate Partner Violence: Not on file  ? ? ?PHYSICAL EXAM ? ?Vitals:  ? 11/24/21 1006  ?BP: (!) 142/82  ?Pulse: 77  ? ?There is no height or weight on file to calculate BMI. ? ?Generalized: Well developed, in no acute distress, siting upright in w/c ?Neurological examination  ?Mentation: Alert, global aphasia, severe dysarthria, speech is nonsensical, moderate difficulty following commands ?Cranial nerve II-XII: Pupils were equal round reactive to light. Extraocular movements were full, visual field were full on confrontational test. Facial sensation and strength were normal, but slightly less on the right. Decreased shoulder shrug on the right. ?Motor: Right upper extremity is flaccid, increased tone, 2/5 strength  right lower extremity, mildly increased tone, left side is normal arm and leg ?Sensory: Responds to touch on right side, but response is less  ?Coordination: Normal with the left, cannot perform on the righ

## 2021-11-26 NOTE — Progress Notes (Signed)
I agree with the above plan 

## 2022-02-02 ENCOUNTER — Encounter: Payer: Self-pay | Admitting: Internal Medicine

## 2022-02-02 ENCOUNTER — Ambulatory Visit: Payer: MEDICAID | Attending: Internal Medicine | Admitting: Internal Medicine

## 2022-02-02 ENCOUNTER — Telehealth: Payer: Self-pay | Admitting: Emergency Medicine

## 2022-02-02 VITALS — BP 121/69 | HR 87 | Temp 98.7°F | Ht 67.0 in | Wt 163.0 lb

## 2022-02-02 DIAGNOSIS — I69322 Dysarthria following cerebral infarction: Secondary | ICD-10-CM

## 2022-02-02 DIAGNOSIS — F419 Anxiety disorder, unspecified: Secondary | ICD-10-CM

## 2022-02-02 DIAGNOSIS — D649 Anemia, unspecified: Secondary | ICD-10-CM

## 2022-02-02 DIAGNOSIS — R7989 Other specified abnormal findings of blood chemistry: Secondary | ICD-10-CM

## 2022-02-02 DIAGNOSIS — Z87891 Personal history of nicotine dependence: Secondary | ICD-10-CM

## 2022-02-02 DIAGNOSIS — K029 Dental caries, unspecified: Secondary | ICD-10-CM

## 2022-02-02 DIAGNOSIS — R159 Full incontinence of feces: Secondary | ICD-10-CM | POA: Insufficient documentation

## 2022-02-02 DIAGNOSIS — I69359 Hemiplegia and hemiparesis following cerebral infarction affecting unspecified side: Secondary | ICD-10-CM

## 2022-02-02 DIAGNOSIS — R3981 Functional urinary incontinence: Secondary | ICD-10-CM

## 2022-02-02 DIAGNOSIS — Z7689 Persons encountering health services in other specified circumstances: Secondary | ICD-10-CM

## 2022-02-02 DIAGNOSIS — E559 Vitamin D deficiency, unspecified: Secondary | ICD-10-CM

## 2022-02-02 DIAGNOSIS — F32A Depression, unspecified: Secondary | ICD-10-CM | POA: Insufficient documentation

## 2022-02-02 DIAGNOSIS — I10 Essential (primary) hypertension: Secondary | ICD-10-CM

## 2022-02-02 NOTE — Patient Instructions (Signed)
I have submitted the referral for home physical therapy, Occupational Therapy and speech therapy. We will place an order for incontinence supplies. I have submitted a referral for him to see the dentist. Referral submitted for physical medicine and rehabilitation.

## 2022-02-02 NOTE — Progress Notes (Signed)
Patient ID: Joshua Price, male    DOB: Feb 05, 1961  MRN: [id number]  CC: Hospitalization Follow-up   Subjective: Joshua Price is a 61 y.o. male who presents for new patient visit and hospital follow-up. 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  Treated with hypertonic saline for cerebral edema and mass effect.  Hospitalization complicated by hyponatremia and aspiration pneumonia.  Noted to have mild anemia with H/H 11.6/35 at the time of discharge.  Also noted to have mild elevation in AST/ALT of 56/144; patient was drinking a lot of beer prior to hospitalization.  LDL was 94.  Started on Robaxin for increased tone to right upper extremity.  Started on Ativan and Zyprexa secondary to cognitive and behavioral issues from stroke.  Discharged to SNF, [place]. -Seen by neurology PA 11/24/2021.  Advised to continue PT/ST/OT.  Stressed importance of good blood pressure control.  Today: Dischg from SNF yesterday. Mom forgot to bring list of medications/prescriptions that were given on discharge yesterday.  I requested that she return to her car and get them for me before leaving today Mom reports some days pt would refused P.T so they stopped offering it to them at the SNF.  ST stopped after mid May because therapist left.  He became long term NH pt but mom decided to take him home because patient was depressed and anxious about being there..  -Mom wants him to have home PT/OT/ST. -Still has problems with speech but it has improved some. -Can stand for only 3 mins due to plegia in the right leg.  Unable to use right hand/arm.  He is right-hand dominant -Pt in WC but does have a  walker.  Mom states he was not using the walker at the facility.  Has shower chair.  Mom and pt declines bedside commode Wears adult underwear.  Incontinence of urine and bowel due to not being able to get to rest room in time.  - He gets agitated easily.  He is on Zyprexa 5 mg at bedtime and 2 mg during the day for depression and lorazepam 0.5 mg twice a day for anxiety. -Problems swallowing at first but now eating regular foods.  Pt feeds himself.  Quit smoking and drinking since stroke Pt will be staying at his own house with his 58 yr old son and a male friend.  Mom will be there during the day but not at nights.     Other medications noted on prescriptions that mother has with her include vitamin D 25 mg daily for vitamin D deficiency, multivitamins, ascorbic acid, Tylenol 325 mg 2 tablets every 4 hours as needed and mag oxide 400 mg daily. 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     folic acid (FOLVITE) 1 MG tablet Take 1 tablet (1 mg total) by mouth daily.     LORazepam (ATIVAN) 0.5 MG tablet Take 1 tablet (0.5 mg total) by mouth 2 (two) times daily. 30 tablet 0   methocarbamol (ROBAXIN) 750 MG tablet Take 1 tablet (750 mg total) by mouth 3 (three) times daily.     metoprolol tartrate (LOPRESSOR) 25 MG tablet Take 1  tablet (25 mg total) by mouth 2 (two) times daily.     Multiple Vitamin (MULTIVITAMIN WITH MINERALS) TABS tablet Take 1 tablet by mouth daily.     OLANZapine (ZYPREXA) 2.5 MG tablet Take 1 tablet (2.5 mg total) by mouth daily.     OLANZapine (ZYPREXA) 5 MG tablet Take 1 tablet (5 mg total) by mouth at bedtime.     pantoprazole (PROTONIX) 40 MG tablet Take 1 tablet (40 mg total) by mouth at bedtime.     polyethylene glycol (MIRALAX / GLYCOLAX) 17 g packet Place 17 g into feeding tube daily as needed for moderate constipation. 14 each 0   potassium chloride (KLOR-CON) 20 MEQ packet Take 40 mEq by mouth daily.     thiamine 100 MG tablet Take 1 tablet (100 mg total) by mouth daily.     No current facility-administered medications on file prior to visit.    Allergies  Allergen Reactions   Lisinopril Swelling    Social History   Socioeconomic History   Marital status: Single    Spouse name: Not on file   Number of children: Not on file   Years of education: Not on file   Highest education level: Not on file  Occupational History   Not on file  Tobacco Use   Smoking status: Former    Packs/day: 0.25    Types: Cigarettes   Smokeless tobacco: Never  Vaping Use   Vaping Use: Never used  Substance and Sexual Activity   Alcohol use: Not Currently   Drug use: No   Sexual activity: Not Currently    Birth control/protection: None  Other Topics Concern   Not on file  Social History Narrative   Not on file   Social Determinants of Health   Financial Resource Strain: Not on file  Food Insecurity: Not on file  Transportation Needs: Not on file  Physical Activity: Not on file  Stress: Not on file  Social Connections: Not on file  Intimate Partner Violence: Not on file    Family History  Problem Relation Age of Onset   Cancer Sister    Hypertension Sister    Hypertension Mother     No past surgical history on file.  ROS: Review of Systems Negative except as stated  above  PHYSICAL EXAM: BP 121/69   Pulse 87   Temp 98.7 F (37.1 C) (Oral)   Ht 5\' 7"  (1.702 m)   Wt 163 lb (73.9 kg)   SpO2 99%   BMI 25.53 kg/m   Wt Readings from Last 3 Encounters:  02/02/22 163 lb (73.9 kg)  10/04/21 155 lb 6.8 oz (70.5 kg)  10/01/21 155 lb 6.8 oz (70.5 kg)    Physical Exam  General appearance - alert, well appearing, and in no distress Mental status -patient very talkative though speech not clear.  Seems agitated at times. Eyes -wears glasses.  Mouth -oral mucosa is moist.  He has poor oral hygiene with significant plaque buildup and some cavities. Neck - supple, no significant adenopathy Chest - clear to auscultation, no wheezes, rales or rhonchi, symmetric air entry Heart - normal rate, regular rhythm, normal S1, S2, no murmurs, rubs, clicks or gallops Neurological -dense hemiplegia in the right upper and lower extremity. Power in the left hand 4+/5.  Power in the left upper and lower extremity 4+/5 with some spasticity. Extremities -mild swelling of the right hand.      02/02/2022    8:51 AM 02/24/2015    3:36 PM  Depression screen PHQ 2/9  Decreased Interest 0 0  Down, Depressed, Hopeless 0 0  PHQ - 2 Score 0 0  Altered sleeping 0   Tired, decreased energy 0   Change in appetite 0   Feeling bad or failure about yourself  0   Trouble concentrating 0   Moving slowly or fidgety/restless 0   Suicidal thoughts 0   PHQ-9 Score 0       02/02/2022    8:51 AM  GAD 7 : Generalized Anxiety Score  Nervous, Anxious, on Edge 0  Control/stop worrying 0  Worry too much - different things 0  Trouble relaxing 0  Restless 0  Easily annoyed or irritable 0  Afraid - awful might happen 0  Total GAD 7 Score 0        Latest Ref Rng & Units 10/04/2021    9:25 PM 10/01/2021    7:45 AM 09/29/2021    5:02 AM  CMP  Glucose 70 - 99 mg/dL 161  096  045   BUN 8 - 23 mg/dL 20  19  12    Creatinine 0.61 - 1.24 mg/dL  4.09  8.11   Sodium 135 - 145 mmol/L  133  136  140   Potassium 3.5 - 5.1 mmol/L 4.4  3.8  3.8   Chloride 98 - 111 mmol/L 99  100  106   CO2 22 - 32 mmol/L 23  26  24    Calcium 8.9 - 10.3 mg/dL 9.7  9.6  9.6   Total Protein 6.5 - 8.1 g/dL  8.5  8.1   Total Bilirubin 0.3 - 1.2 mg/dL  0.8  0.6   Alkaline Phos 38 - 126 U/L  144  146   AST 15 - 41 U/L  70  61   ALT 0 - 44 U/L  56  50    Lipid Panel     Component Value Date/Time   CHOL 255 (H) 09/07/2021 0100   TRIG 287 (H) 09/08/2021 0615   HDL 34 (L) 09/07/2021 0100   CHOLHDL 7.5 09/07/2021 0100   VLDL UNABLE TO CALCULATE IF TRIGLYCERIDE OVER 400 mg/dL 09/09/2021 09/09/2021   LDLCALC UNABLE TO CALCULATE IF TRIGLYCERIDE OVER 400 mg/dL 78/29/5621 3086   LDLDIRECT 94.3 09/07/2021 0100    CBC    Component Value Date/Time   WBC 7.7 10/04/2021 2125   RBC 3.69 (L) 10/04/2021 2125   HGB 11.6 (L) 10/04/2021 2125   HCT 35.1 (L) 10/04/2021 2125   PLT 230 10/04/2021 2125   MCV 95.1 10/04/2021 2125   MCH 31.4 10/04/2021 2125   MCHC 33.0 10/04/2021 2125   RDW 12.2 10/04/2021 2125   LYMPHSABS 2.2 10/04/2021 2125   MONOABS 0.5 10/04/2021 2125   EOSABS 0.1 10/04/2021 2125   BASOSABS 0.0 10/04/2021 2125    ASSESSMENT AND PLAN: 1. Establishing care with new doctor, encounter  for   2. Hemiplegia affecting dominant side, post-stroke [facility]) Referral submitted to home health for home PT, ST and OT. - Ambulatory referral to Home Health - Ambulatory referral to Physical Medicine Rehab - Lipid panel  3. Dysarthria as late effect of stroke - Ambulatory referral to Home Health - Ambulatory referral to Physical Medicine Rehab  4. Essential hypertension At goal.  Continue Norvasc 10 mg daily and metoprolol 25 mg twice a day.  Discussed the importance of good blood pressure control given his history of hemorrhagic stroke. - Comprehensive metabolic panel - Magnesium  5. Full incontinence of feces 6. Functional urinary incontinence We will get him signed up for incontinence supplies  through [employer]  7. Former smoker Commended him on quitting.  Encouraged him to remain tobacco free.  8. Abnormal LFTs Recheck liver function test today. - Comprehensive metabolic panel  9. Normocytic anemia Recheck CBC today. - CBC  10. Dental cavities - Ambulatory referral to Dentistry  11. Anxiety and depression We will get him in with behavioral health for medication management. - Ambulatory referral to Psychiatry  12. Vitamin D deficiency - VITAMIN D 25 Hydroxy (Vit-D Deficiency, Fractures)     Patient was given the opportunity to ask questions.  Patient verbalized understanding of the plan and was able to repeat key elements of the plan.   This documentation was completed using Paediatric nurse.  Any transcriptional errors are unintentional.  No orders of the defined types were placed in this encounter.    Requested Prescriptions    No prescriptions requested or ordered in this encounter    No follow-ups on file.  Jonah Blue, MD, FACP

## 2022-02-02 NOTE — Progress Notes (Signed)
Pt wants referral for speech therapy & PT

## 2022-02-02 NOTE — Telephone Encounter (Signed)
Copied from CRM [id number]. Topic: General - Other >> Feb 02, 2022  2:55 PM Dondra Prader E wrote: Reason for CRM: Pt's mother called, wants a call back to discuss his medications following his new patient appt today.   Best contact: [phone]

## 2022-02-03 ENCOUNTER — Telehealth: Payer: Self-pay | Admitting: Internal Medicine

## 2022-02-03 ENCOUNTER — Other Ambulatory Visit: Payer: Self-pay | Admitting: Internal Medicine

## 2022-02-03 LAB — COMPREHENSIVE METABOLIC PANEL
ALT: 21 IU/L (ref 0–44)
AST: 29 IU/L (ref 0–40)
Albumin/Globulin Ratio: 1.2 (ref 1.2–2.2)
Albumin: 5 g/dL — ABNORMAL HIGH (ref 3.9–4.9)
Alkaline Phosphatase: 129 IU/L — ABNORMAL HIGH (ref 44–121)
BUN/Creatinine Ratio: 20 (ref 10–24)
BUN: 20 mg/dL (ref 8–27)
Bilirubin Total: 0.5 mg/dL (ref 0.0–1.2)
CO2: 23 mmol/L (ref 20–29)
Calcium: 10.6 mg/dL — ABNORMAL HIGH (ref 8.6–10.2)
Chloride: 101 mmol/L (ref 96–106)
Creatinine, Ser: 0.98 mg/dL (ref 0.76–1.27)
Globulin, Total: 4.1 g/dL (ref 1.5–4.5)
Glucose: 110 mg/dL — ABNORMAL HIGH (ref 70–99)
Potassium: 3.8 mmol/L (ref 3.5–5.2)
Sodium: 142 mmol/L (ref 134–144)
Total Protein: 9.1 g/dL — ABNORMAL HIGH (ref 6.0–8.5)
eGFR: 88 mL/min/{1.73_m2} (ref >59)

## 2022-02-03 LAB — CBC
Hematocrit: 42.2 % (ref 37.5–51.0)
Hemoglobin: 13.8 g/dL (ref 13.0–17.7)
MCH: 29 pg (ref 26.6–33.0)
MCHC: 32.7 g/dL (ref 31.5–35.7)
MCV: 89 fL (ref 79–97)
Platelets: 178 10*3/uL (ref 150–450)
RBC: 4.76 x10E6/uL (ref 4.14–5.80)
RDW: 12.7 % (ref 11.6–15.4)
WBC: 6.3 10*3/uL (ref 3.4–10.8)

## 2022-02-03 LAB — LIPID PANEL
Chol/HDL Ratio: 4 ratio (ref 0.0–5.0)
Cholesterol, Total: 229 mg/dL — ABNORMAL HIGH (ref 100–199)
HDL: 57 mg/dL (ref >39)
LDL Chol Calc (NIH): 146 mg/dL — ABNORMAL HIGH (ref 0–99)
Triglycerides: 147 mg/dL (ref 0–149)
VLDL Cholesterol Cal: 26 mg/dL (ref 5–40)

## 2022-02-03 LAB — MAGNESIUM: Magnesium: 1.9 mg/dL (ref 1.6–2.3)

## 2022-02-03 LAB — VITAMIN D 25 HYDROXY (VIT D DEFICIENCY, FRACTURES): Vit D, 25-Hydroxy: 27.5 ng/mL — ABNORMAL LOW (ref 30.0–100.0)

## 2022-02-03 MED ORDER — ATORVASTATIN CALCIUM 10 MG PO TABS: 10.0000 mg | ORAL_TABLET | Freq: Every day | ORAL | 1 refills | Status: AC

## 2022-02-03 NOTE — Telephone Encounter (Signed)
-----   Message from Dionne Bucy sent at 02/02/2022  4:22 PM EDT ----- Regarding: RE: [facility] Health Referral Good Afternoon Patient don't have insurance  sent referral to  [facility] [address] [place], [place] [address] Ph# [phone]  ----- Message ----- From: Marcine Matar, MD Sent: 02/02/2022   1:40 PM EDT To: Dionne Bucy Subject: Behavoral Health Referral                      Please try to get him in with behavioral health for depression and anxiety [facility] or who ever takes his insurance.  Thanks.

## 2022-02-03 NOTE — Progress Notes (Signed)
Let patient and his mother know that: Cholesterol levels elevated.  I recommend starting medication to help lower the cholesterol called atorvastatin.  Prescription has been sent to his pharmacy. -Kidney function is good. -Liver function tests much better compared to previous. -Vitamin D level mildly low.  He should continue to take vitamin D supplement 1000 mcg daily. -Blood cell counts normal.  The rest of this is for my information The 10-year ASCVD risk score (Arnett DK, et al., 2019) is: 12.6%   Values used to calculate the score:     Age: 61 years     Sex: Male     Is Non-Hispanic African American: Yes     Diabetic: No     Tobacco smoker: No     Systolic Blood Pressure: 121 mmHg     Is BP treated: Yes     HDL Cholesterol: 57 mg/dL     Total Cholesterol: 229 mg/dL

## 2022-02-04 NOTE — Telephone Encounter (Addendum)
Per Dr.Johnson's message in OV note on 02/02/2022:  Let patient and his mother know that: Cholesterol levels elevated.  I recommend starting medication to help lower the cholesterol called atorvastatin.  Prescription has been sent to his pharmacy. -Kidney function is good. -Liver function tests much better compared to previous. -Vitamin D level mildly low.  He should continue to take vitamin D supplement 1000 mcg daily. -Blood cell counts normal.   Left message on voicemail to return call.  Unsure if message has been delivered. It was not routed to anyone.

## 2022-02-10 NOTE — Telephone Encounter (Signed)
Spoke to mother, given information from Dr. Laural Benes advising on message below:   Let patient and his mother know that: Cholesterol levels elevated.  I recommend starting medication to help lower the cholesterol called atorvastatin.  Prescription has been sent to his pharmacy. -Kidney function is good. -Liver function tests much better compared to previous. -Vitamin D level mildly low.  He should continue to take vitamin D supplement 1000 mcg daily. -Blood cell counts normal.  Please try to get him in with behavioral health for depression and anxiety [facility] or who ever takes his insurance.  Thanks.  Gave mother phone number to [facility] [address] [place], [place] [address] Ph# [phone]

## 2022-02-12 ENCOUNTER — Telehealth: Payer: Self-pay | Admitting: Internal Medicine

## 2022-02-12 NOTE — Telephone Encounter (Signed)
Call placed to patient's mother. She informs me today that his [place] Medicaid was updated and his updated card was sent to him in the mail. He received this today and was able [facility]. She tells me this was done after [facility] contacted our clinic this morning.

## 2022-02-12 NOTE — Telephone Encounter (Signed)
Pat with [employer] called asking if there was anyway this patient could get medical assistance for his prescriptions.  She said he has medicaid but the type he has is not paying for his his.  It eventually will she said.  [facility] [place] but he hasn't been able to pick up because of financial reasons.  Dr. Laural Benes ordered the prescriptions.  CB@  [phone]

## 2022-02-16 ENCOUNTER — Other Ambulatory Visit: Payer: Self-pay | Admitting: Internal Medicine

## 2022-02-16 ENCOUNTER — Ambulatory Visit: Payer: Self-pay

## 2022-02-16 MED ORDER — VITAMIN D3 25 MCG PO TABS
1000.0000 [IU] | ORAL_TABLET | Freq: Every day | ORAL | 0 refills | Status: DC
Start: 2022-02-16 — End: 2022-05-14

## 2022-02-16 MED ORDER — METOPROLOL TARTRATE 25 MG PO TABS: 25.0000 mg | ORAL_TABLET | Freq: Two times a day (BID) | ORAL | 0 refills | Status: AC

## 2022-02-16 MED ORDER — AMLODIPINE BESYLATE 10 MG PO TABS: 10.0000 mg | ORAL_TABLET | Freq: Every day | ORAL | 0 refills | Status: AC

## 2022-02-16 NOTE — Telephone Encounter (Signed)
  Chief Complaint: med mgmnt Symptoms: NA Frequency: NA Pertinent Negatives: NA Disposition: [] ED /[] Urgent Care (no appt availability in office) / [] Appointment(In office/virtual)/ []  [facility] Virtual Care/ [] Home Care/ [] Refused Recommended Disposition /[] [facility]/ []  Follow-up with PCP Additional Notes: spoke with pt's mother. She states she went to pharmacy to pu medication but only cholesterol was there. She is needing the BP medications and vit D. Reviewed medications and per protocol was able to send in medications to pharmacy. Ms. said she didn't need anything further and pt was doing good since being home.   Summary: mother- son just released from nursing home/inqiring re a medication   Pls call pt mother back, saw Dr on 7/27 and was to be calling in meds, only called in cholesterol and thought she was calling in BP med ? Does not know name... Please assist in order to know if to be on BP med, he has just been released from nursing home [phone] [facility]      Reason for Disposition  Caller has medicine question only, adult not sick, AND triager answers question  Answer Assessment - Initial Assessment Questions 1. NAME of MEDICINE: "What medicine(s) are you calling about?"     Metoprolol and amlodipine 2. QUESTION: "What is your question?" (e.g., double dose of medicine, side effect)     Needing them sent to pharmacy  3. PRESCRIBER: "Who prescribed the medicine?" Reason: if prescribed by specialist, call should be referred to that group.     Dr.Johnson, OV on 02/02/22  Protocols used: Medication Question Call-A-AH

## 2022-02-16 NOTE — Telephone Encounter (Signed)
Requested Prescriptions  Pending Prescriptions Disp Refills  . amLODipine (NORVASC) 10 MG tablet      Sig: Take 1 tablet (10 mg total) by mouth daily.     Cardiovascular: Calcium Channel Blockers 2 Passed - 02/16/2022  9:57 AM      Passed - Last BP in normal range    BP Readings from Last 1 Encounters:  02/02/22 121/69         Passed - Last Heart Rate in normal range    Pulse Readings from Last 1 Encounters:  02/02/22 87         Passed - Valid encounter within last 6 months    Recent Outpatient Visits          2 weeks ago Establishing care with new doctor, encounter for   [facility] Marcine Matar, MD   6 years ago Angioedema, initial encounter   [facility] Ambrose Finland, NP   8 years ago HTN (hypertension)   [facility] Doris Cheadle, MD      Future Appointments            In 1 month Laural Benes, Binnie Rail, MD [facility]           . metoprolol tartrate (LOPRESSOR) 25 MG tablet      Sig: Take 1 tablet (25 mg total) by mouth 2 (two) times daily.     Cardiovascular:  Beta Blockers Passed - 02/16/2022  9:57 AM      Passed - Last BP in normal range    BP Readings from Last 1 Encounters:  02/02/22 121/69         Passed - Last Heart Rate in normal range    Pulse Readings from Last 1 Encounters:  02/02/22 87         Passed - Valid encounter within last 6 months    Recent Outpatient Visits          2 weeks ago Establishing care with new doctor, encounter for   [facility] Marcine Matar, MD   6 years ago Angioedema, initial encounter   [facility] Ambrose Finland, NP   8 years ago HTN (hypertension)   [facility] Doris Cheadle, MD      Future Appointments            In 1 month Laural Benes, Binnie Rail, MD [facility]            . vitamin D3 (CHOLECALCIFEROL) 25 MCG tablet 60 tablet     Sig: Take 1 tablet (1,000 Units total) by mouth daily.     Endocrinology:  Vitamins - Vitamin D Supplementation 2 Failed - 02/16/2022  9:57 AM      Failed - Manual Review: Route requests for 50,000 IU strength to the provider      Failed - Ca in normal range and within 360 days    Calcium  Date Value Ref Range Status  02/02/2022 10.6 (H) 8.6 - 10.2 mg/dL Final   Calcium, Ion  Date Value Ref Range Status  09/05/2021 1.16 1.15 - 1.40 mmol/L Final         Failed - Vitamin D in normal range and within 360 days    Vit D, 25-Hydroxy  Date Value Ref Range Status  02/02/2022 27.5 (L) 30.0 -  100.0 ng/mL Final    Comment:    Vitamin D deficiency has been defined by the Institute of Medicine and an Endocrine Society practice guideline as a level of serum 25-OH vitamin D less than 20 ng/mL (1,2). The Endocrine Society went on to further define vitamin D insufficiency as a level between 21 and 29 ng/mL (2). 1. IOM (Institute of Medicine). 2010. Dietary reference    intakes for calcium and D. [place]: The    [employer]. 2. Holick MF, Binkley [place], Bischoff-Ferrari HA, et al.    Evaluation, treatment, and prevention of vitamin D    deficiency: an Endocrine Society clinical practice    guideline. JCEM. 2011 Jul; 96(7):1911-30.          Passed - Valid encounter within last 12 months    Recent Outpatient Visits          2 weeks ago Establishing care with new doctor, encounter for   [facility] Marcine Matar, MD   6 years ago Angioedema, initial encounter   [facility] Ambrose Finland, NP   8 years ago HTN (hypertension)   [place] [facility] Doris Cheadle, MD      Future Appointments            In 1 month Laural Benes, Binnie Rail, MD [facility]

## 2022-02-22 ENCOUNTER — Telehealth: Payer: Self-pay | Admitting: Internal Medicine

## 2022-02-22 DIAGNOSIS — I610 Nontraumatic intracerebral hemorrhage in hemisphere, subcortical: Secondary | ICD-10-CM

## 2022-02-22 DIAGNOSIS — R159 Full incontinence of feces: Secondary | ICD-10-CM

## 2022-02-22 DIAGNOSIS — R3981 Functional urinary incontinence: Secondary | ICD-10-CM

## 2022-02-22 NOTE — Telephone Encounter (Signed)
Order has been faxed over to [employer].

## 2022-02-22 NOTE — Telephone Encounter (Signed)
Copied from CRM [id number]. Topic: General - Other >> Feb 19, 2022  4:24 PM Turkey B wrote: Reason for CRM: Fleet Contras, from [facility] urology called in about getting incontinence supplies, for diapers and bed pads.

## 2022-02-22 NOTE — Telephone Encounter (Signed)
Please advise.----DD,RMA  

## 2022-02-22 NOTE — Telephone Encounter (Signed)
Patient seen on 25 July by Dr. Laural Benes based on the note it appears he would qualify for incontinence supplies  Erskine Squibb I am adding you to this message.  I did put orders in for the incontinence supplies I believe there is enough in the chart note from July to support this

## 2022-02-22 NOTE — Addendum Note (Signed)
Addended by: Shan Levans E on: 02/22/2022 10:16 AM   Modules accepted: Orders

## 2022-02-23 NOTE — Telephone Encounter (Signed)
Noted  

## 2022-02-23 NOTE — Telephone Encounter (Signed)
yes

## 2022-04-06 ENCOUNTER — Ambulatory Visit: Payer: Self-pay | Admitting: Internal Medicine

## 2022-04-09 ENCOUNTER — Telehealth (HOSPITAL_COMMUNITY): Payer: Self-pay | Admitting: Licensed Clinical Social Worker

## 2022-04-09 NOTE — Telephone Encounter (Signed)
04/09/2022 Name: Joshua Price MRN: [id number] DOB: September 09, 1960  Unsuccessful outbound call made today to assist with:   [employer] CD-IOP  Outreach Attempt:  1st Attempt  A HIPAA compliant voice message was left requesting a return call.  Instructed patient to call back at [phone].  Dixon Boos, [facility]

## 2022-05-14 ENCOUNTER — Other Ambulatory Visit: Payer: Self-pay | Admitting: Internal Medicine

## 2022-09-06 ENCOUNTER — Other Ambulatory Visit: Payer: Self-pay | Admitting: Vascular Surgery

## 2022-09-06 DIAGNOSIS — Z122 Encounter for screening for malignant neoplasm of respiratory organs: Secondary | ICD-10-CM

## 2022-09-06 DIAGNOSIS — Z0001 Encounter for general adult medical examination with abnormal findings: Secondary | ICD-10-CM

## 2022-09-28 DIAGNOSIS — Z0271 Encounter for disability determination: Secondary | ICD-10-CM

## 2022-10-04 ENCOUNTER — Ambulatory Visit
Admission: RE | Admit: 2022-10-04 | Discharge: 2022-10-04 | Disposition: A | Discharge status: Home / Self Care | Payer: Medicaid Other | Source: Ambulatory Visit | Attending: Vascular Surgery | Admitting: Vascular Surgery

## 2022-10-04 DIAGNOSIS — Z0001 Encounter for general adult medical examination with abnormal findings: Secondary | ICD-10-CM

## 2022-10-04 DIAGNOSIS — Z122 Encounter for screening for malignant neoplasm of respiratory organs: Secondary | ICD-10-CM

## 2023-07-04 IMAGING — CT CT HEAD W/O CM
3 of 4 series · 13 of 47 positions shown, 15 images · non-contrast
Comparison: CT head 09/10/2021; CT head face cervical spine
09/05/2021.

CLINICAL DATA: Multiple falls with tooth avulsion, known
intraparenchymal hemorrhage



[Series 3: head wo · axial · 0.46mm/px · z∈[-169,-54]mm · 7 of 31 slices shown, 9 images]
[im 4/31  brain]
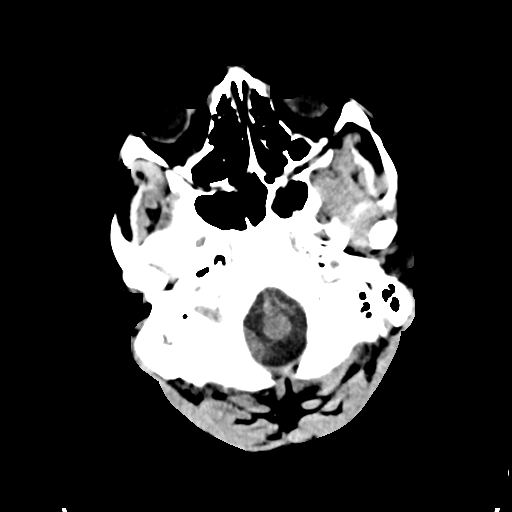
[im 4/31  bone]
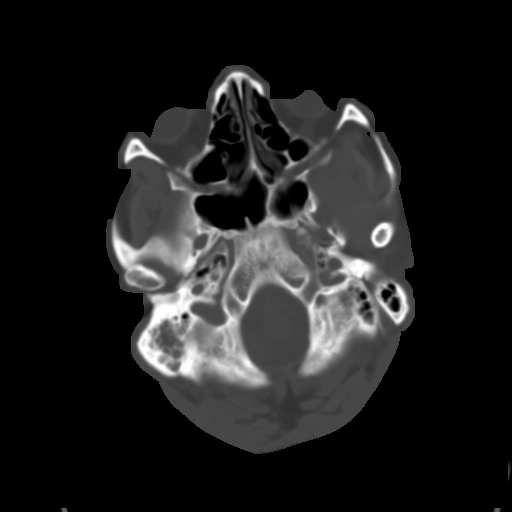
[im 8/31  brain]
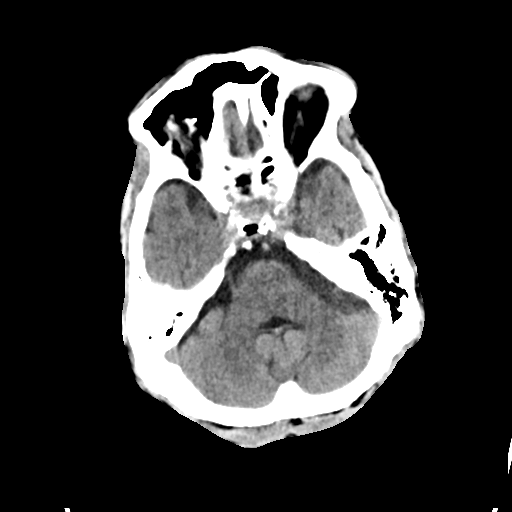
[im 12/31  brain]
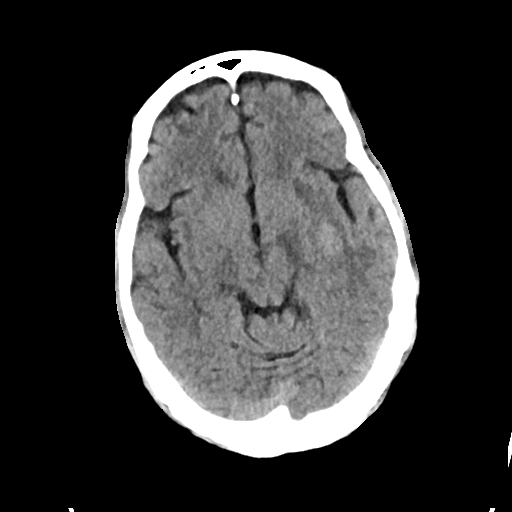
[im 16/31  brain]
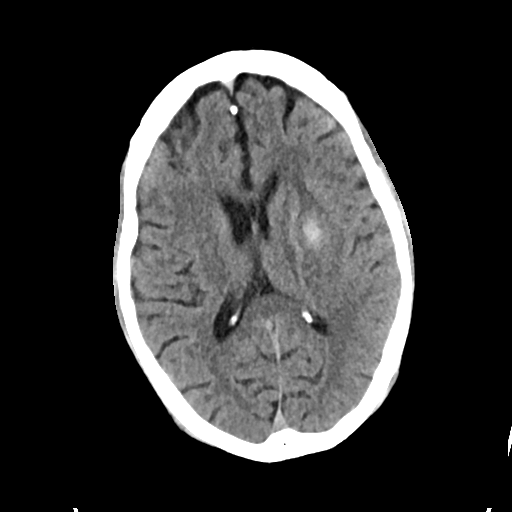
[im 19/31  brain]
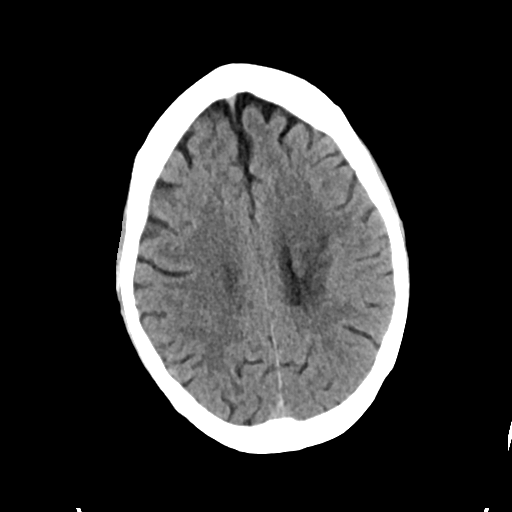
[im 19/31  bone]
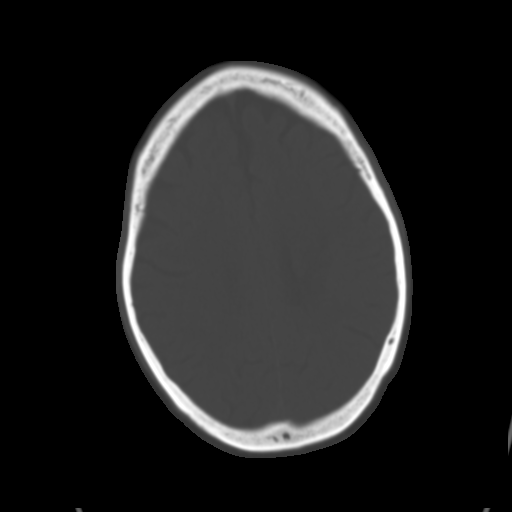
[im 23/31  brain]
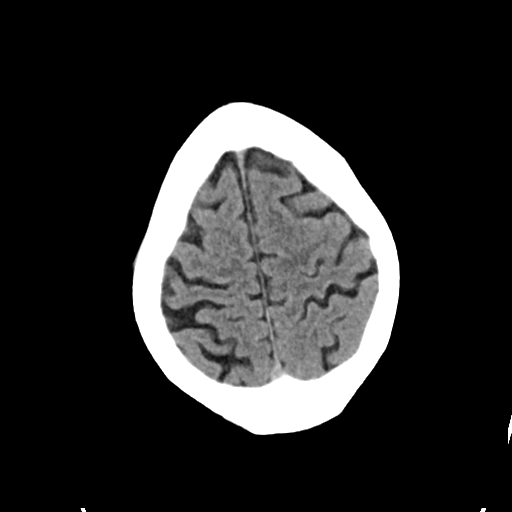
[im 27/31  brain]
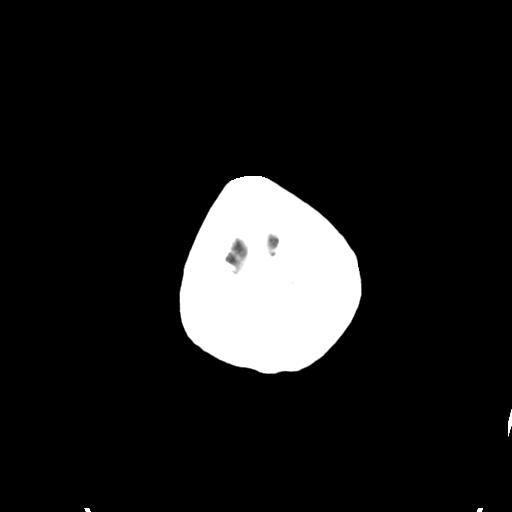

[Series 5: cor soft · coronal · 0.32mm/px · 3 of 70 slices shown]
[im 24/70  brain]
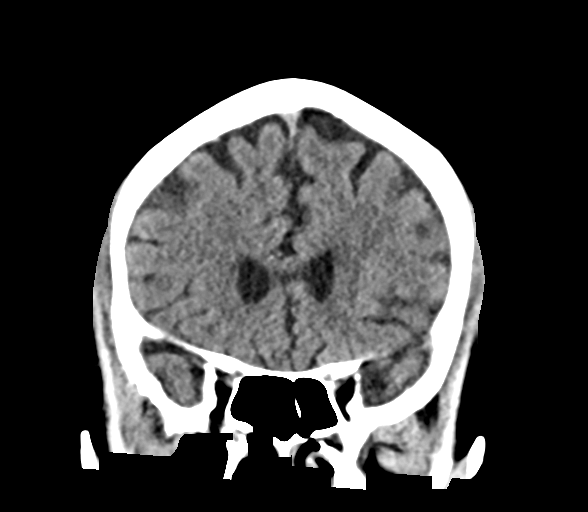
[im 31/70  brain]
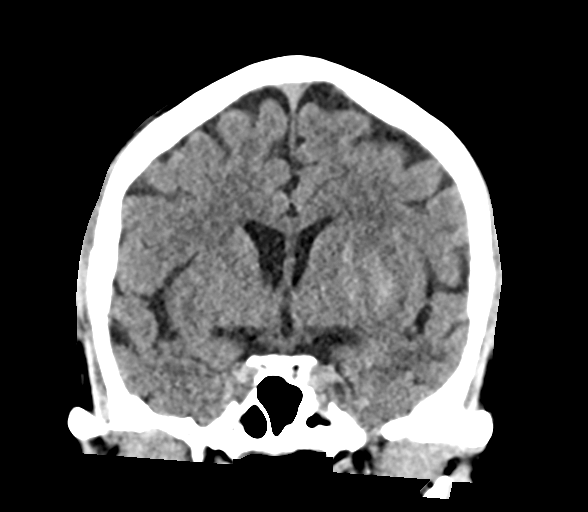
[im 39/70  brain]
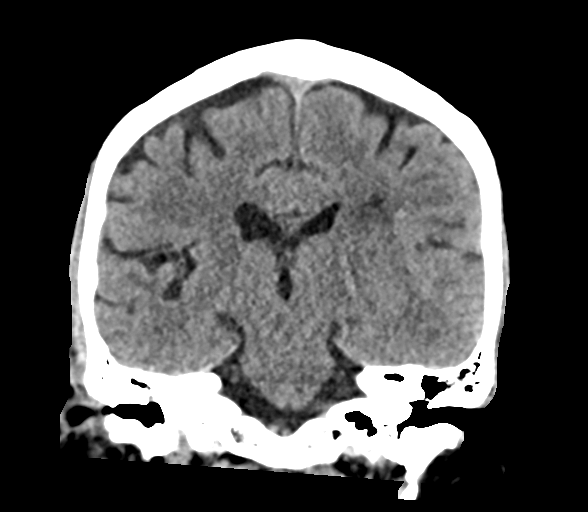

[Series 6: sag soft · sagittal · 0.36mm/px · 3 of 61 slices shown]
[im 21/61  brain]
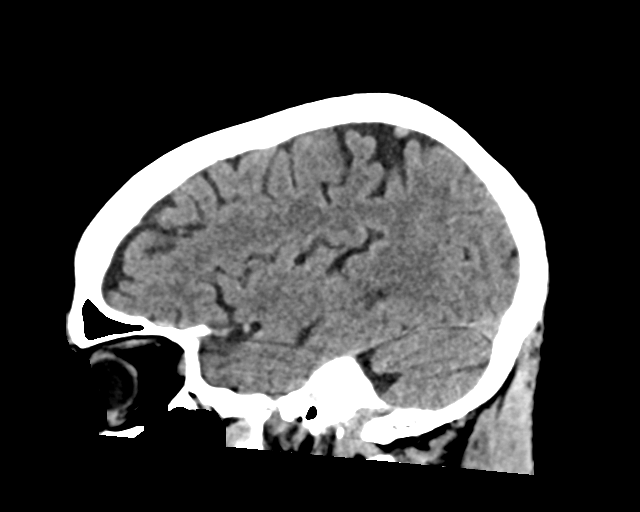
[im 31/61  brain]
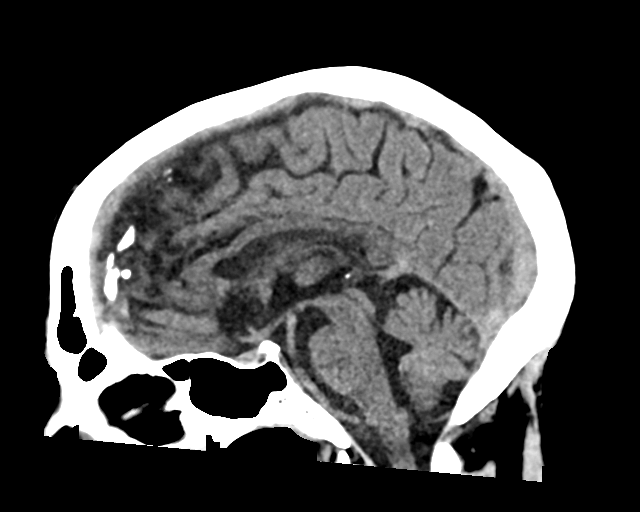
[im 41/61  brain]
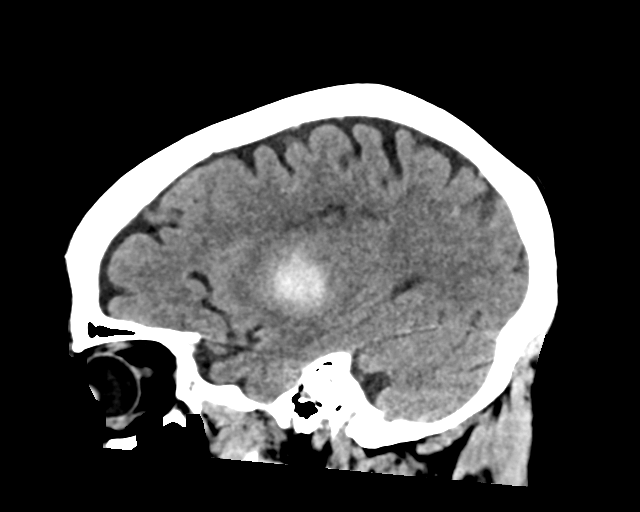

[13 of 47 positions shown; findings below may reference images not displayed]

FINDINGS: CT HEAD FINDINGS

Brain: Redemonstrated intraparenchymal hemorrhage centered in the
left basal ganglia, with the hyperdense segment measuring
approximately 1.7 x 1.3 x 2.2 cm (AP x TR x CC) (series 3, image 14
and series 5, image 35), previously 4.7 x 2.8 x 4.2 cm when
remeasured similarly. Hypodensity surrounding, which may represent
associated Guillmar. No new hemorrhage, acute cortical infarct, or mass.
Decreased mass effect on the left lateral ventricle. No residual
midline shift. No hydrocephalus or extra-axial collection.

Vascular: No hyperdense vessel.

Skull: Normal. Negative for fracture or focal lesion.

Other: None.

CT MAXILLOFACIAL FINDINGS

Osseous: Absence of the left maxillary central incisor (series 9,
image 22), which was present on the prior exam, although there was
significant periapical lucency. Periapical lucency is again noted
about multiple maxillary greater than mandibular teeth. No fracture
or mandibular dislocation.

Orbits: Negative. No traumatic or inflammatory finding.

Sinuses: Minimal mucosal thickening.

Soft tissues: No large laceration or significant collection.

CT CERVICAL SPINE FINDINGS

Alignment: Reversal of the normal cervical lordosis.  No listhesis.

Skull base and vertebrae: No acute fracture. No primary bone lesion
or focal pathologic process. Degenerative changes in the cervical
spine, with fusion of the C2-C4 facets.

Soft tissues and spinal canal: No prevertebral fluid or swelling. No
visible canal hematoma.

Disc levels: Multilevel degenerative changes without significant
spinal canal stenosis. Multilevel uncovertebral and facet
hypertrophy, which causes severe neural foraminal narrowing on the
right at C3-C4 and C4-C5.

Upper chest: No focal pulmonary opacity.  Centrilobular emphysema.

Other: None.
IMPRESSION: 1. Interval decrease in the size of an intraparenchymal hemorrhage
centered in the left basal ganglia, with an area of hyperdense
hemorrhage that is suspected to represent acute or subacute on
chronic hemorrhage. No significant mass effect and resolution of
previously noted midline shift.
2. Absence of the left maxillary central incisor, which was present
on the prior exam, although it did demonstrate significant
periapical lucency. Periapical lucency is again noted about multiple
maxillary greater than mandibular teeth. No other acute facial bone
finding.
3.  No acute fracture or traumatic listhesis in the cervical spine.

These results were called by telephone at the time of interpretation
on 10/04/2021 at [DATE] to provider JULIO JUAN KAU , who verbally
acknowledged these results.

## 2023-07-04 IMAGING — CT CT MAXILLOFACIAL W/O CM
3 of 6 series · 13 of 47 positions shown, 15 images · non-contrast
Comparison: CT head 09/10/2021; CT head face cervical spine
09/05/2021.

CLINICAL DATA: Multiple falls with tooth avulsion, known
intraparenchymal hemorrhage



[Series 3: maxilllofacial 2.0 hr40 3 · axial · 0.36mm/px · z∈[-269,-131]mm · 8 of 81 slices shown, 10 images]
[im 6/81  brain]
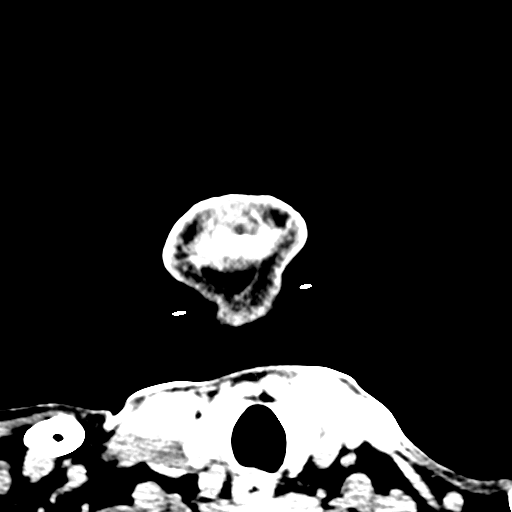
[im 6/81  bone]
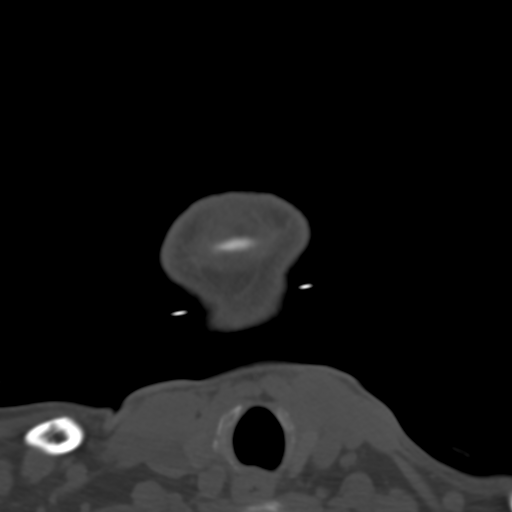
[im 18/81  bone]
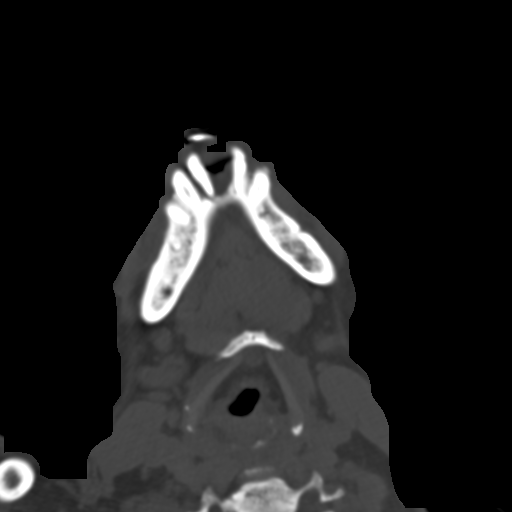
[im 29/81  bone]
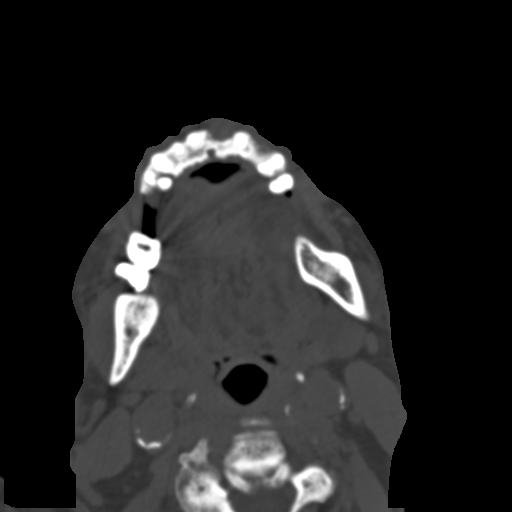
[im 35/81  bone]
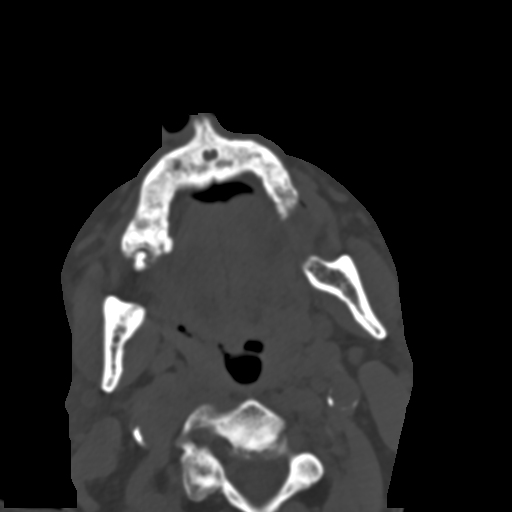
[im 46/81  brain]
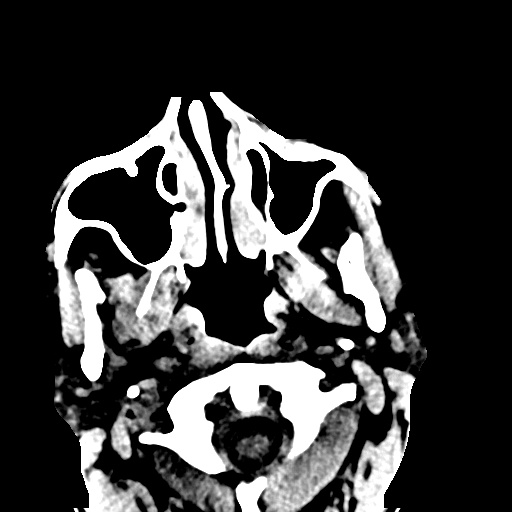
[im 46/81  bone]
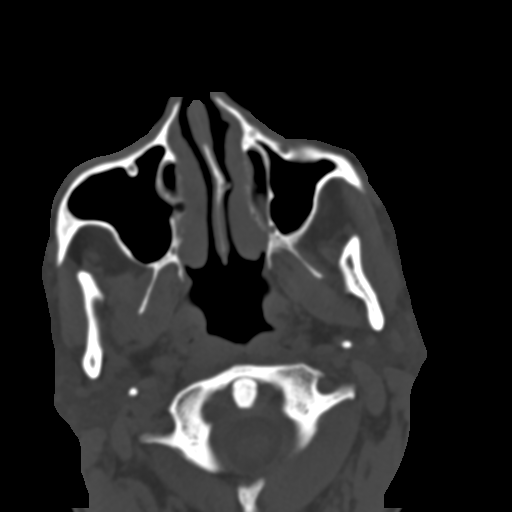
[im 52/81  bone]
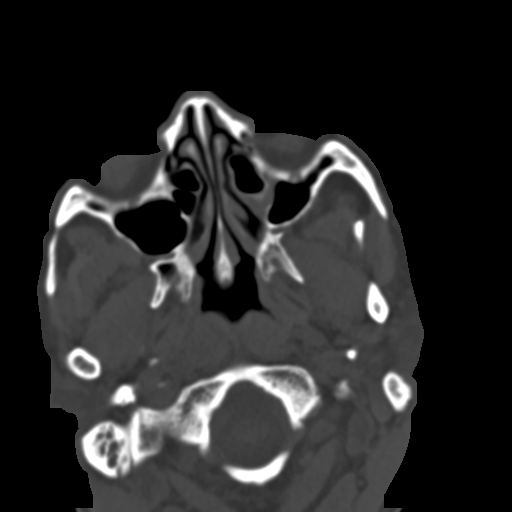
[im 63/81  bone]
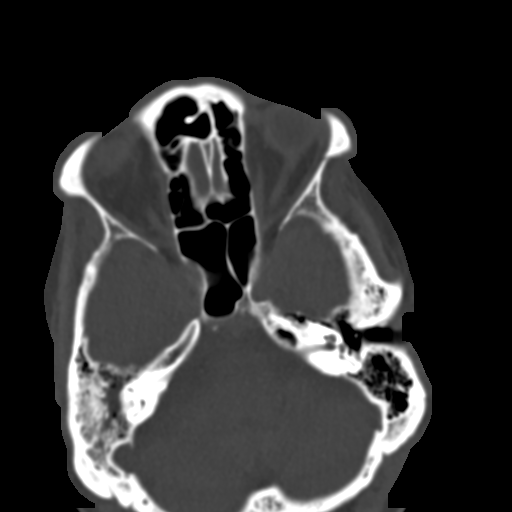
[im 75/81  bone]
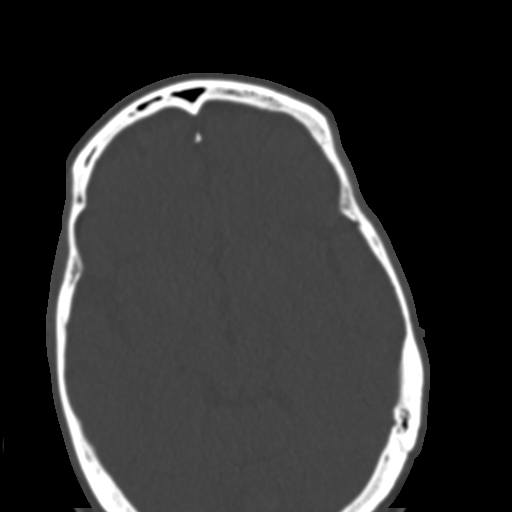

[Series 9: bone cor · coronal · 0.33mm/px · 3 of 121 slices shown]
[im 31/121  bone]
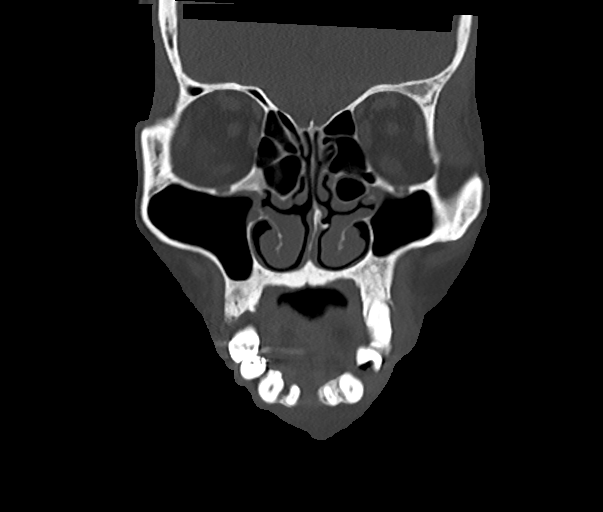
[im 61/121  bone]
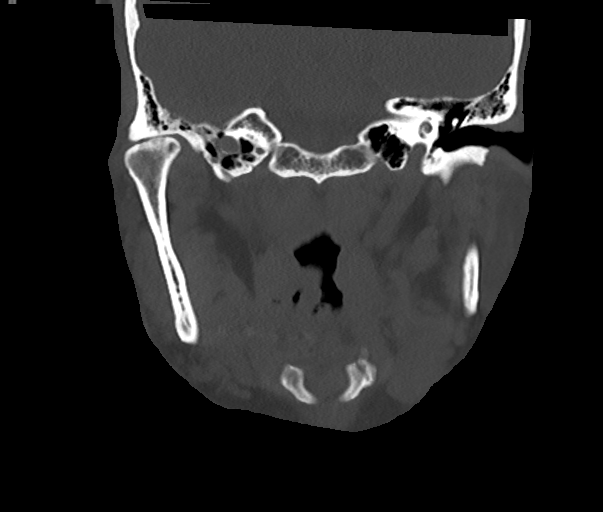
[im 91/121  bone]
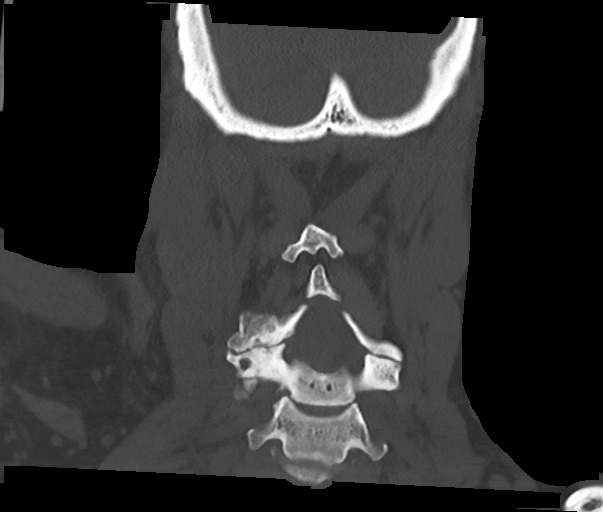

[Series 10: bone sag · sagittal · 0.34mm/px · 2 of 89 slices shown]
[im 30/89  bone]
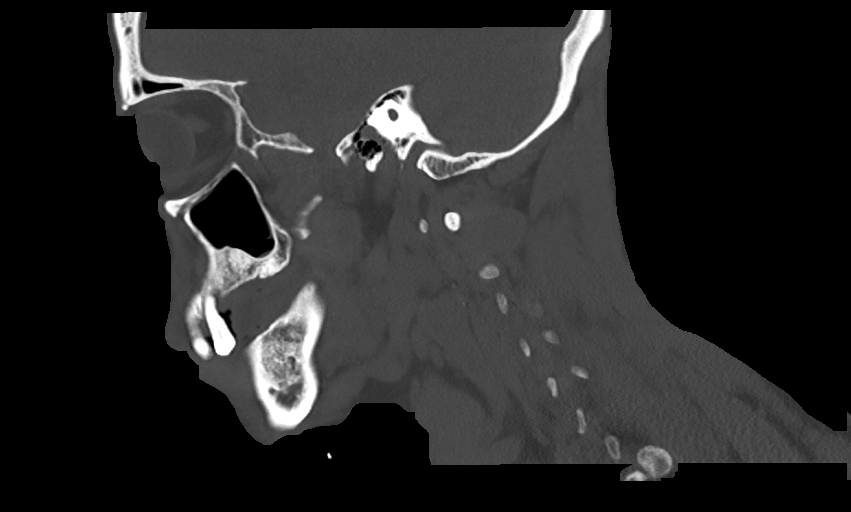
[im 59/89  bone]
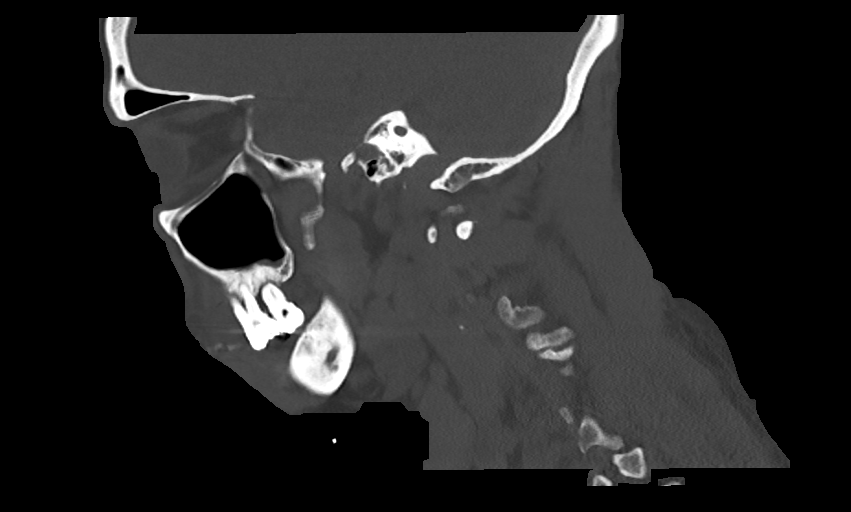

[13 of 47 positions shown; findings below may reference images not displayed]

FINDINGS: CT HEAD FINDINGS

Brain: Redemonstrated intraparenchymal hemorrhage centered in the
left basal ganglia, with the hyperdense segment measuring
approximately 1.7 x 1.3 x 2.2 cm (AP x TR x CC) (series 3, image 14
and series 5, image 35), previously 4.7 x 2.8 x 4.2 cm when
remeasured similarly. Hypodensity surrounding, which may represent
associated Guillmar. No new hemorrhage, acute cortical infarct, or mass.
Decreased mass effect on the left lateral ventricle. No residual
midline shift. No hydrocephalus or extra-axial collection.

Vascular: No hyperdense vessel.

Skull: Normal. Negative for fracture or focal lesion.

Other: None.

CT MAXILLOFACIAL FINDINGS

Osseous: Absence of the left maxillary central incisor (series 9,
image 22), which was present on the prior exam, although there was
significant periapical lucency. Periapical lucency is again noted
about multiple maxillary greater than mandibular teeth. No fracture
or mandibular dislocation.

Orbits: Negative. No traumatic or inflammatory finding.

Sinuses: Minimal mucosal thickening.

Soft tissues: No large laceration or significant collection.

CT CERVICAL SPINE FINDINGS

Alignment: Reversal of the normal cervical lordosis.  No listhesis.

Skull base and vertebrae: No acute fracture. No primary bone lesion
or focal pathologic process. Degenerative changes in the cervical
spine, with fusion of the C2-C4 facets.

Soft tissues and spinal canal: No prevertebral fluid or swelling. No
visible canal hematoma.

Disc levels: Multilevel degenerative changes without significant
spinal canal stenosis. Multilevel uncovertebral and facet
hypertrophy, which causes severe neural foraminal narrowing on the
right at C3-C4 and C4-C5.

Upper chest: No focal pulmonary opacity.  Centrilobular emphysema.

Other: None.
IMPRESSION: 1. Interval decrease in the size of an intraparenchymal hemorrhage
centered in the left basal ganglia, with an area of hyperdense
hemorrhage that is suspected to represent acute or subacute on
chronic hemorrhage. No significant mass effect and resolution of
previously noted midline shift.
2. Absence of the left maxillary central incisor, which was present
on the prior exam, although it did demonstrate significant
periapical lucency. Periapical lucency is again noted about multiple
maxillary greater than mandibular teeth. No other acute facial bone
finding.
3.  No acute fracture or traumatic listhesis in the cervical spine.

These results were called by telephone at the time of interpretation
on 10/04/2021 at [DATE] to provider JULIO JUAN KAU , who verbally
acknowledged these results.

## 2024-01-24 ENCOUNTER — Other Ambulatory Visit: Payer: Self-pay

## 2024-01-24 DIAGNOSIS — Z720 Tobacco use: Secondary | ICD-10-CM

## 2024-02-10 ENCOUNTER — Ambulatory Visit
Admission: RE | Admit: 2024-02-10 | Discharge: 2024-02-10 | Disposition: A | Discharge status: Home / Self Care | Source: Ambulatory Visit

## 2024-02-10 DIAGNOSIS — Z720 Tobacco use: Secondary | ICD-10-CM
